# Patient Record
Sex: Female | Born: 1960 | Race: Black or African American | Hispanic: No | Marital: Married | Smoking: Former smoker
Health system: Southern US, Community
[De-identification: ages and names within clinical notes are randomized; demographics above are authoritative.]

## PROBLEM LIST (undated history)

## (undated) DIAGNOSIS — Z853 Personal history of malignant neoplasm of breast: Secondary | ICD-10-CM

## (undated) DIAGNOSIS — I10 Essential (primary) hypertension: Secondary | ICD-10-CM

## (undated) DIAGNOSIS — C3410 Malignant neoplasm of upper lobe, unspecified bronchus or lung: Secondary | ICD-10-CM

## (undated) DIAGNOSIS — E119 Type 2 diabetes mellitus without complications: Secondary | ICD-10-CM

## (undated) DIAGNOSIS — T4145XA Adverse effect of unspecified anesthetic, initial encounter: Secondary | ICD-10-CM

## (undated) DIAGNOSIS — Z87891 Personal history of nicotine dependence: Secondary | ICD-10-CM

## (undated) DIAGNOSIS — B9681 Helicobacter pylori [H. pylori] as the cause of diseases classified elsewhere: Secondary | ICD-10-CM

## (undated) DIAGNOSIS — N19 Unspecified kidney failure: Secondary | ICD-10-CM

## (undated) DIAGNOSIS — IMO0002 Reserved for concepts with insufficient information to code with codable children: Secondary | ICD-10-CM

## (undated) DIAGNOSIS — Z1635 Resistance to multiple antimicrobial drugs: Secondary | ICD-10-CM

## (undated) DIAGNOSIS — H269 Unspecified cataract: Secondary | ICD-10-CM

## (undated) DIAGNOSIS — Z1239 Encounter for other screening for malignant neoplasm of breast: Secondary | ICD-10-CM

## (undated) DIAGNOSIS — T8859XA Other complications of anesthesia, initial encounter: Secondary | ICD-10-CM

## (undated) DIAGNOSIS — C801 Malignant (primary) neoplasm, unspecified: Secondary | ICD-10-CM

## (undated) DIAGNOSIS — C50919 Malignant neoplasm of unspecified site of unspecified female breast: Secondary | ICD-10-CM

## (undated) DIAGNOSIS — Z1211 Encounter for screening for malignant neoplasm of colon: Secondary | ICD-10-CM

## (undated) DIAGNOSIS — K2980 Duodenitis without bleeding: Secondary | ICD-10-CM

## (undated) HISTORY — DX: Duodenitis without bleeding: K29.80

## (undated) HISTORY — DX: Personal history of nicotine dependence: Z87.891

## (undated) HISTORY — DX: Type 2 diabetes mellitus without complications: E11.9

## (undated) HISTORY — DX: Unspecified kidney failure: N19

## (undated) HISTORY — DX: Malignant neoplasm of unspecified site of unspecified female breast: C50.919

## (undated) HISTORY — DX: Essential (primary) hypertension: I10

## (undated) HISTORY — PX: CATARACT EXTRACTION: SUR2

## (undated) HISTORY — DX: Encounter for other screening for malignant neoplasm of breast: Z12.39

## (undated) HISTORY — DX: Reserved for concepts with insufficient information to code with codable children: IMO0002

## (undated) HISTORY — PX: MASTECTOMY: SHX3

## (undated) HISTORY — DX: Helicobacter pylori (H. pylori) as the cause of diseases classified elsewhere: B96.81

## (undated) HISTORY — DX: Malignant (primary) neoplasm, unspecified: C80.1

## (undated) HISTORY — DX: Encounter for screening for malignant neoplasm of colon: Z12.11

## (undated) HISTORY — DX: Unspecified cataract: H26.9

## (undated) HISTORY — PX: COLONOSCOPY: SHX174

## (undated) HISTORY — DX: Resistance to multiple antimicrobial drugs: Z16.35

## (undated) HISTORY — DX: Malignant neoplasm of upper lobe, unspecified bronchus or lung: C34.10

## (undated) HISTORY — PX: LOBECTOMY: SHX5089

## (undated) HISTORY — DX: Personal history of malignant neoplasm of breast: Z85.3

---

## 1999-09-01 DIAGNOSIS — C801 Malignant (primary) neoplasm, unspecified: Secondary | ICD-10-CM

## 1999-09-01 HISTORY — PX: BREAST LUMPECTOMY: SHX2

## 1999-09-01 HISTORY — DX: Malignant (primary) neoplasm, unspecified: C80.1

## 2003-09-01 HISTORY — PX: CATARACT EXTRACTION W/ INTRAOCULAR LENS IMPLANT: SHX1309

## 2003-09-01 HISTORY — PX: INTERCOSTAL NERVE BLOCK: SHX5021

## 2004-07-03 ENCOUNTER — Ambulatory Visit: Payer: Self-pay | Admitting: Oncology

## 2004-10-13 ENCOUNTER — Ambulatory Visit: Payer: Self-pay | Admitting: Oncology

## 2004-12-04 ENCOUNTER — Ambulatory Visit: Payer: Self-pay | Admitting: Oncology

## 2004-12-29 ENCOUNTER — Ambulatory Visit: Payer: Self-pay | Admitting: Oncology

## 2005-01-28 ENCOUNTER — Ambulatory Visit: Payer: Self-pay | Admitting: Unknown Physician Specialty

## 2005-06-01 ENCOUNTER — Ambulatory Visit: Payer: Self-pay | Admitting: Family Medicine

## 2005-06-17 ENCOUNTER — Ambulatory Visit: Payer: Self-pay | Admitting: Oncology

## 2005-10-02 ENCOUNTER — Ambulatory Visit: Payer: Self-pay | Admitting: General Surgery

## 2006-10-06 ENCOUNTER — Ambulatory Visit: Payer: Self-pay | Admitting: General Surgery

## 2007-11-07 ENCOUNTER — Ambulatory Visit: Payer: Self-pay | Admitting: General Surgery

## 2008-11-08 ENCOUNTER — Ambulatory Visit: Payer: Self-pay | Admitting: Family Medicine

## 2008-11-12 ENCOUNTER — Ambulatory Visit: Payer: Self-pay | Admitting: General Surgery

## 2008-12-12 ENCOUNTER — Ambulatory Visit: Payer: Self-pay | Admitting: Family Medicine

## 2009-02-28 ENCOUNTER — Ambulatory Visit: Payer: Self-pay | Admitting: Oncology

## 2009-03-28 ENCOUNTER — Ambulatory Visit: Payer: Self-pay | Admitting: Oncology

## 2009-08-31 HISTORY — PX: CARPAL TUNNEL RELEASE: SHX101

## 2009-11-20 ENCOUNTER — Ambulatory Visit: Payer: Self-pay | Admitting: General Surgery

## 2010-02-11 ENCOUNTER — Ambulatory Visit: Payer: Self-pay | Admitting: Orthopedic Surgery

## 2010-02-17 ENCOUNTER — Ambulatory Visit: Payer: Self-pay | Admitting: Orthopedic Surgery

## 2010-12-03 ENCOUNTER — Ambulatory Visit: Payer: Self-pay | Admitting: General Surgery

## 2011-01-30 ENCOUNTER — Ambulatory Visit: Payer: Self-pay | Admitting: General Surgery

## 2011-06-29 ENCOUNTER — Other Ambulatory Visit: Payer: Self-pay | Admitting: Family Medicine

## 2011-10-30 ENCOUNTER — Other Ambulatory Visit: Payer: Self-pay | Admitting: Family Medicine

## 2011-10-30 LAB — COMPREHENSIVE METABOLIC PANEL
Albumin: 4.1 g/dL (ref 3.4–5.0)
Anion Gap: 10 (ref 7–16)
Bilirubin,Total: 0.4 mg/dL (ref 0.2–1.0)
Calcium, Total: 9.5 mg/dL (ref 8.5–10.1)
EGFR (African American): >60
Glucose: 110 mg/dL — ABNORMAL HIGH (ref 65–99)
Potassium: 3.5 mmol/L (ref 3.5–5.1)
SGOT(AST): 26 U/L (ref 15–37)

## 2011-10-30 LAB — CBC WITH DIFFERENTIAL/PLATELET
Basophil #: 0.3 10*3/uL — ABNORMAL HIGH (ref 0.0–0.1)
Basophil %: 1.3 %
Eosinophil #: 0.4 10*3/uL (ref 0.0–0.7)
Eosinophil %: 1.9 %
HGB: 14.6 g/dL (ref 12.0–16.0)
Lymphocyte #: 3.8 10*3/uL — ABNORMAL HIGH (ref 1.0–3.6)
MCH: 32.5 pg (ref 26.0–34.0)
MCV: 95 fL (ref 80–100)
Monocyte %: 3.9 %
Neutrophil #: 17 10*3/uL — ABNORMAL HIGH (ref 1.4–6.5)
Platelet: 273 10*3/uL (ref 150–440)
RDW: 12.8 % (ref 11.5–14.5)
WBC: 22.4 10*3/uL — ABNORMAL HIGH (ref 3.6–11.0)

## 2011-10-30 LAB — URINALYSIS, COMPLETE
Bilirubin,UR: NEGATIVE
Ketone: NEGATIVE
Ph: 5 (ref 4.5–8.0)
Specific Gravity: 1.023 (ref 1.003–1.030)
Squamous Epithelial: 2

## 2011-11-04 ENCOUNTER — Ambulatory Visit: Payer: Self-pay | Admitting: Oncology

## 2011-11-06 LAB — CBC CANCER CENTER
HGB: 13.8 g/dL (ref 12.0–16.0)
Lymphocyte %: 33.6 %
MCHC: 33.8 g/dL (ref 32.0–36.0)
Monocyte #: 0.8 x10 3/mm — ABNORMAL HIGH (ref 0.0–0.7)
Monocyte %: 6.3 %
Neutrophil %: 54.6 %
Platelet: 279 x10 3/mm (ref 150–440)
RDW: 12.5 % (ref 11.5–14.5)
WBC: 13.4 x10 3/mm — ABNORMAL HIGH (ref 3.6–11.0)

## 2011-11-30 ENCOUNTER — Ambulatory Visit: Payer: Self-pay | Admitting: Oncology

## 2011-12-09 ENCOUNTER — Ambulatory Visit: Payer: Self-pay | Admitting: General Surgery

## 2011-12-25 LAB — URINALYSIS, COMPLETE
Bacteria: NEGATIVE
Glucose,UR: NEGATIVE mg/dL (ref 0–75)
Ketone: NEGATIVE
Ph: 8.5 (ref 4.5–8.0)
Protein: NEGATIVE

## 2011-12-25 LAB — CBC CANCER CENTER
Eosinophil #: 0.5 x10 3/mm (ref 0.0–0.7)
Eosinophil %: 2.9 %
HCT: 41.4 % (ref 35.0–47.0)
MCH: 33.1 pg (ref 26.0–34.0)
MCHC: 35 g/dL (ref 32.0–36.0)
RBC: 4.38 10*6/uL (ref 3.80–5.20)
RDW: 13.1 % (ref 11.5–14.5)

## 2011-12-30 ENCOUNTER — Ambulatory Visit: Payer: Self-pay | Admitting: Oncology

## 2012-08-31 DIAGNOSIS — C3410 Malignant neoplasm of upper lobe, unspecified bronchus or lung: Secondary | ICD-10-CM

## 2012-08-31 HISTORY — PX: BREAST SURGERY: SHX581

## 2012-08-31 HISTORY — DX: Malignant neoplasm of upper lobe, unspecified bronchus or lung: C34.10

## 2012-09-05 ENCOUNTER — Other Ambulatory Visit: Payer: Self-pay | Admitting: Family Medicine

## 2012-09-05 LAB — URINALYSIS, COMPLETE
Bacteria: NONE SEEN
Glucose,UR: NEGATIVE mg/dL (ref 0–75)
Leukocyte Esterase: NEGATIVE
Nitrite: NEGATIVE
RBC,UR: 11 /HPF (ref 0–5)
Specific Gravity: 1.031 (ref 1.003–1.030)
Squamous Epithelial: 34
WBC UR: 7 /HPF (ref 0–5)

## 2012-09-05 LAB — COMPREHENSIVE METABOLIC PANEL
Albumin: 3.8 g/dL (ref 3.4–5.0)
Alkaline Phosphatase: 116 U/L (ref 50–136)
BUN: 13 mg/dL (ref 7–18)
Bilirubin,Total: 0.3 mg/dL (ref 0.2–1.0)
Co2: 27 mmol/L (ref 21–32)
EGFR (Non-African Amer.): >60
Glucose: 131 mg/dL — ABNORMAL HIGH (ref 65–99)
Potassium: 3.5 mmol/L (ref 3.5–5.1)
SGOT(AST): 21 U/L (ref 15–37)
SGPT (ALT): 24 U/L (ref 12–78)
Sodium: 141 mmol/L (ref 136–145)

## 2012-09-05 LAB — CBC WITH DIFFERENTIAL/PLATELET
Basophil #: 0.3 10*3/uL — ABNORMAL HIGH (ref 0.0–0.1)
Eosinophil #: 0.6 10*3/uL (ref 0.0–0.7)
Eosinophil %: 3.7 %
Lymphocyte %: 30.1 %
MCH: 31.5 pg (ref 26.0–34.0)
Monocyte #: 1.2 x10 3/mm — ABNORMAL HIGH (ref 0.2–0.9)
Monocyte %: 8 %
Neutrophil #: 8.6 10*3/uL — ABNORMAL HIGH (ref 1.4–6.5)
Neutrophil %: 56.1 %
Platelet: 278 10*3/uL (ref 150–440)
WBC: 15.3 10*3/uL — ABNORMAL HIGH (ref 3.6–11.0)

## 2012-09-05 LAB — LIPID PANEL
Cholesterol: 219 mg/dL — ABNORMAL HIGH (ref 0–200)
HDL Cholesterol: 36 mg/dL — ABNORMAL LOW (ref 40–60)
Triglycerides: 104 mg/dL (ref 0–200)

## 2012-10-21 ENCOUNTER — Encounter: Payer: Self-pay | Admitting: General Surgery

## 2012-11-22 ENCOUNTER — Ambulatory Visit (INDEPENDENT_AMBULATORY_CARE_PROVIDER_SITE_OTHER): Payer: 59 | Admitting: General Surgery

## 2012-11-22 ENCOUNTER — Encounter: Payer: Self-pay | Admitting: General Surgery

## 2012-11-22 VITALS — BP 124/64 | HR 76 | Resp 14 | Ht 62.5 in | Wt 148.0 lb

## 2012-11-22 DIAGNOSIS — N63 Unspecified lump in unspecified breast: Secondary | ICD-10-CM

## 2012-11-22 DIAGNOSIS — Z853 Personal history of malignant neoplasm of breast: Secondary | ICD-10-CM

## 2012-11-22 NOTE — Patient Instructions (Signed)
Patient has been scheduled for a bilateral diagnostic mammogram at [facility] for 11-23-12 at 8:30 am. She is aware of date, time, and instructions.

## 2012-11-22 NOTE — Progress Notes (Signed)
Patient ID: Brittney Lam, female   DOB: 1961-04-27, 52 y.o.   MRN: [id number]  Chief Complaint  Patient presents with  . breast complaint    HPI [name] is a 52 y.o. female. HPI  Patient here today for evaluation of both areas around the nipple, has sharp shooting pains daily with "deep itch" for about 6 months and getting worse. Denies nipple drainage. Patient with known history of left breast cancer s/p L/SN/R in 2001. She has noticed moderate increase in right breast size Past Medical History  Diagnosis Date  . Unspecified essential hypertension   . Kidney failure   . Personal history of tobacco use, presenting hazards to health   . Cataract   . Personal history of malignant neoplasm of breast   . Breast screening, unspecified   . Special screening for malignant neoplasms, colon   . Cancer     left breast cancer s/p L/SN/R in 2001    Past Surgical History  Procedure Laterality Date  . Breast lumpectomy Left 2001  . Intercostal nerve block  2005  . Cataract extraction w/ intraocular lens implant  2005  . Carpal tunnel release Right 2011    No family history on file.  Social History History  Substance Use Topics  . Smoking status: Current Every Day Smoker -- 0.50 packs/day for 30 years  . Smokeless tobacco: Never Used  . Alcohol Use: No    No Known Allergies  Current Outpatient Prescriptions  Medication Sig Dispense Refill  . hydrochlorothiazide (HYDRODIURIL) 25 MG tablet Take 25 mg by mouth daily.      . Multiple Vitamins-Minerals (MULTIVITAMIN PO) Take by mouth.       No current facility-administered medications for this visit.    Review of Systems Review of Systems  Constitutional: Negative for fever, fatigue and unexpected weight change.  Respiratory: Negative.   Cardiovascular: Negative.   Gastrointestinal: Negative.     Blood pressure 124/64, pulse 76, resp. rate 14, height 5' 2.5" (1.588 m), weight 148 lb (67.132 kg).  Physical Exam Physical  Exam  Constitutional: She appears well-developed and well-nourished.  Eyes: Conjunctivae and EOM are normal.  Neck: No tracheal deviation present. No thyromegaly present.  Pulmonary/Chest: Right breast exhibits inverted nipple, mass, skin change and tenderness. Right breast exhibits no nipple discharge. Left breast exhibits skin change. Left breast exhibits no inverted nipple and no tenderness. Breasts are asymmetrical.    Lymphadenopathy:    She has no cervical adenopathy.    She has no axillary adenopathy.    Data Reviewed none  Assessment    New right breast abnormality-large hard central mass with skin changes.     Plan  Ordered mammogram. To return after for [place] and core biopsy        Currie Paris 11/22/2012, 10:29 AM

## 2012-11-23 ENCOUNTER — Ambulatory Visit: Payer: Self-pay | Admitting: General Surgery

## 2012-11-24 ENCOUNTER — Other Ambulatory Visit: Payer: Self-pay

## 2012-11-24 ENCOUNTER — Encounter: Payer: Self-pay | Admitting: General Surgery

## 2012-11-24 ENCOUNTER — Ambulatory Visit (INDEPENDENT_AMBULATORY_CARE_PROVIDER_SITE_OTHER): Payer: 59 | Admitting: General Surgery

## 2012-11-24 ENCOUNTER — Other Ambulatory Visit: Payer: Self-pay | Admitting: General Surgery

## 2012-11-24 VITALS — BP 110/72 | HR 80 | Resp 12 | Ht 62.5 in | Wt 148.0 lb

## 2012-11-24 DIAGNOSIS — N63 Unspecified lump in unspecified breast: Secondary | ICD-10-CM

## 2012-11-24 NOTE — Patient Instructions (Addendum)

## 2012-11-24 NOTE — Progress Notes (Signed)
Patient ID: Brittney Lam, female   DOB: 19-Dec-1960, 52 y.o.   MRN: [id number]  Chief Complaint  Patient presents with  . Follow-up     mammogram    HPI Brittney Lam is a 52 y.o. female here today for her follow up mammogram done on 11/23/12 . HPI  Past Medical History  Diagnosis Date  . Unspecified essential hypertension   . Kidney failure   . Personal history of tobacco use, presenting hazards to health   . Cataract   . Personal history of malignant neoplasm of breast   . Breast screening, unspecified   . Special screening for malignant neoplasms, colon   . Cancer     left breast cancer s/p L/SN/R in 2001    Past Surgical History  Procedure Laterality Date  . Breast lumpectomy Left 2001  . Intercostal nerve block  2005  . Cataract extraction w/ intraocular lens implant  2005  . Carpal tunnel release Right 2011    History reviewed. No pertinent family history.  Social History History  Substance Use Topics  . Smoking status: Current Every Day Smoker -- 0.50 packs/day for 30 years  . Smokeless tobacco: Never Used  . Alcohol Use: No    No Known Allergies  Current Outpatient Prescriptions  Medication Sig Dispense Refill  . hydrochlorothiazide (HYDRODIURIL) 25 MG tablet Take 25 mg by mouth daily.      . Multiple Vitamins-Minerals (MULTIVITAMIN PO) Take by mouth.       No current facility-administered medications for this visit.    Review of Systems Review of Systems  Constitutional: Negative.   Respiratory: Negative.   Cardiovascular: Negative.     There were no vitals taken for this visit.  Physical Exam Physical Exam  Data Reviewed Mammogram reviewed. Shows only skin thickening over right breast. No defined mass or density. US performed here shows heterogeneous pattern with areas of shadowing in uoq central location.  Assessment    Suspicious findings in right breast     Plan    Core biopsy and skin biopsy completed today        Ples Specter 11/24/2012, 8:42 AM

## 2012-11-28 ENCOUNTER — Ambulatory Visit (INDEPENDENT_AMBULATORY_CARE_PROVIDER_SITE_OTHER): Payer: 59 | Admitting: General Surgery

## 2012-11-28 ENCOUNTER — Encounter: Payer: Self-pay | Admitting: General Surgery

## 2012-11-28 VITALS — BP 122/82 | HR 80 | Resp 14 | Ht 62.0 in | Wt 146.0 lb

## 2012-11-28 DIAGNOSIS — C50911 Malignant neoplasm of unspecified site of right female breast: Secondary | ICD-10-CM

## 2012-11-28 DIAGNOSIS — C50919 Malignant neoplasm of unspecified site of unspecified female breast: Secondary | ICD-10-CM

## 2012-11-28 NOTE — Patient Instructions (Signed)
Patient follow up is to be determined.

## 2012-11-29 ENCOUNTER — Ambulatory Visit: Payer: Self-pay | Admitting: Oncology

## 2012-11-29 ENCOUNTER — Telehealth: Payer: Self-pay | Admitting: *Deleted

## 2012-11-29 ENCOUNTER — Other Ambulatory Visit: Payer: Self-pay | Admitting: General Surgery

## 2012-11-29 ENCOUNTER — Encounter: Payer: Self-pay | Admitting: General Surgery

## 2012-11-29 DIAGNOSIS — C50911 Malignant neoplasm of unspecified site of right female breast: Secondary | ICD-10-CM

## 2012-11-29 LAB — COMPREHENSIVE METABOLIC PANEL
Alkaline Phosphatase: 117 U/L (ref 50–136)
Bilirubin,Total: 0.4 mg/dL (ref 0.2–1.0)
Co2: 34 mmol/L — ABNORMAL HIGH (ref 21–32)
EGFR (Non-African Amer.): 57 — ABNORMAL LOW
Osmolality: 283 (ref 275–301)
Potassium: 3.5 mmol/L (ref 3.5–5.1)
SGOT(AST): 16 U/L (ref 15–37)
SGPT (ALT): 21 U/L (ref 12–78)
Total Protein: 8.5 g/dL — ABNORMAL HIGH (ref 6.4–8.2)

## 2012-11-29 LAB — CBC CANCER CENTER
Basophil #: 0.2 x10 3/mm — ABNORMAL HIGH (ref 0.0–0.1)
Basophil %: 1.2 %
Eosinophil #: 0.6 x10 3/mm (ref 0.0–0.7)
HCT: 42.6 % (ref 35.0–47.0)
HGB: 14.6 g/dL (ref 12.0–16.0)
Lymphocyte %: 32.1 %
MCH: 32 pg (ref 26.0–34.0)
MCHC: 34.3 g/dL (ref 32.0–36.0)
MCV: 93 fL (ref 80–100)
Neutrophil #: 8.9 x10 3/mm — ABNORMAL HIGH (ref 1.4–6.5)
RDW: 13.2 % (ref 11.5–14.5)
WBC: 15.6 x10 3/mm — ABNORMAL HIGH (ref 3.6–11.0)

## 2012-11-29 LAB — PROTIME-INR
INR: 0.9
Prothrombin Time: 12.5 secs (ref 11.5–14.7)

## 2012-11-29 LAB — APTT: Activated PTT: 32.6 secs (ref 23.6–35.9)

## 2012-11-29 NOTE — Telephone Encounter (Signed)
Tia from the [facility] called to arrange a port placement. PET scan has been scheduled for 12-05-12 and chemotherapy to possibly start on 12-09-12. Port placement has been arranged at [facility] for 12-07-12. I also spoke with the patient while she was at the cancer center. She is aware of all instructions. Paperwork has been mailed to her.

## 2012-11-29 NOTE — Progress Notes (Signed)
Patient ID: Brittney Lam, female   DOB: 01/01/1961, 52 y.o.   MRN: [id number] Pt had core biopsy of right breast with skin biopsy.  Path- invasive mucinous carcinoma. Skin biopsy showed no cancer. Pt was here with her sister and explained findings in full.  Given the large size of this cancer she would benefit with neoadjuvant chemotherapy followed by mastectomy.  Pt is to see Dr. Doylene Canning tomorrow for treatment planning. PET scan is needed to assess extent of the cancer.  Venous port also discussed. Pt is agreeable to plan.

## 2012-12-01 ENCOUNTER — Encounter: Payer: Self-pay | Admitting: General Surgery

## 2012-12-01 NOTE — Progress Notes (Signed)
Results forwarded to Dr. Doylene Canning.

## 2012-12-05 ENCOUNTER — Ambulatory Visit: Payer: Self-pay | Admitting: Oncology

## 2012-12-05 ENCOUNTER — Encounter: Payer: Self-pay | Admitting: General Surgery

## 2012-12-05 ENCOUNTER — Ambulatory Visit: Payer: Self-pay | Admitting: Anesthesiology

## 2012-12-05 DIAGNOSIS — I10 Essential (primary) hypertension: Secondary | ICD-10-CM

## 2012-12-07 ENCOUNTER — Encounter: Payer: Self-pay | Admitting: General Surgery

## 2012-12-07 ENCOUNTER — Ambulatory Visit: Payer: Self-pay | Admitting: General Surgery

## 2012-12-07 DIAGNOSIS — C50119 Malignant neoplasm of central portion of unspecified female breast: Secondary | ICD-10-CM

## 2012-12-07 HISTORY — PX: INSERTION CENTRAL VENOUS ACCESS DEVICE W/ SUBCUTANEOUS PORT: SUR725

## 2012-12-08 ENCOUNTER — Encounter: Payer: Self-pay | Admitting: General Surgery

## 2012-12-09 LAB — PATHOLOGY

## 2012-12-15 LAB — CBC CANCER CENTER
Basophil #: 0 x10 3/mm (ref 0.0–0.1)
Eosinophil #: 0.3 x10 3/mm (ref 0.0–0.7)
HCT: 38.3 % (ref 35.0–47.0)
Lymphocyte #: 2.6 x10 3/mm (ref 1.0–3.6)
Lymphocyte %: 46 %
MCH: 31.8 pg (ref 26.0–34.0)
MCHC: 34.4 g/dL (ref 32.0–36.0)
Monocyte #: 0.4 x10 3/mm (ref 0.2–0.9)
Neutrophil #: 2.4 x10 3/mm (ref 1.4–6.5)
Neutrophil %: 41.7 %
Platelet: 146 x10 3/mm — ABNORMAL LOW (ref 150–440)
RBC: 4.15 10*6/uL (ref 3.80–5.20)
RDW: 13 % (ref 11.5–14.5)

## 2012-12-20 ENCOUNTER — Encounter: Payer: Self-pay | Admitting: General Surgery

## 2012-12-20 ENCOUNTER — Ambulatory Visit (INDEPENDENT_AMBULATORY_CARE_PROVIDER_SITE_OTHER): Payer: 59 | Admitting: General Surgery

## 2012-12-20 VITALS — BP 122/68 | HR 72 | Resp 12 | Ht 62.0 in | Wt 148.0 lb

## 2012-12-20 DIAGNOSIS — C50911 Malignant neoplasm of unspecified site of right female breast: Secondary | ICD-10-CM

## 2012-12-20 DIAGNOSIS — C50919 Malignant neoplasm of unspecified site of unspecified female breast: Secondary | ICD-10-CM

## 2012-12-20 DIAGNOSIS — R918 Other nonspecific abnormal finding of lung field: Secondary | ICD-10-CM

## 2012-12-20 DIAGNOSIS — R222 Localized swelling, mass and lump, trunk: Secondary | ICD-10-CM

## 2012-12-20 DIAGNOSIS — Z853 Personal history of malignant neoplasm of breast: Secondary | ICD-10-CM

## 2012-12-20 DIAGNOSIS — N63 Unspecified lump in unspecified breast: Secondary | ICD-10-CM

## 2012-12-20 NOTE — Progress Notes (Signed)
Patient ID: Brittney Lam, female   DOB: 1960/09/05, 52 y.o.   MRN: [id number]  Chief Complaint  Patient presents with  . Routine Post Op    port placement    HPI Brittney Lam is a 52 y.o. female.  Patient here today for followup port a cath placement 12-07-12. She had first cycle of chemotherapy.  Pre treatment CT scan has showed a right lung upper lobe mass.  Tentative plan for biopsy of this.   HPI  Past Medical History  Diagnosis Date  . Unspecified essential hypertension   . Kidney failure   . Personal history of tobacco use, presenting hazards to health   . Cataract   . Personal history of malignant neoplasm of breast   . Breast screening, unspecified   . Special screening for malignant neoplasms, colon   . Cancer 2001    left breast cancer s/p L/SN/R in 2001  . Cancer 2014    right breast    Past Surgical History  Procedure Laterality Date  . Breast lumpectomy Left 2001  . Intercostal nerve block  2005  . Cataract extraction w/ intraocular lens implant  2005  . Carpal tunnel release Right 2011  . Insertion central venous access device w/ subcutaneous port  12-07-12    History reviewed. No pertinent family history.  Social History History  Substance Use Topics  . Smoking status: Current Every Day Smoker -- 0.50 packs/day for 30 years  . Smokeless tobacco: Never Used  . Alcohol Use: No    No Known Allergies  Current Outpatient Prescriptions  Medication Sig Dispense Refill  . acetaminophen (TYLENOL) 325 MG tablet Take 650 mg by mouth every 6 (six) hours as needed for pain.      Marland Kitchen docusate sodium (COLACE) 100 MG capsule Take 100 mg by mouth 2 (two) times daily.      . promethazine (PHENERGAN) 25 MG tablet Take 25 mg by mouth every 6 (six) hours as needed for nausea.      . traMADol (ULTRAM) 50 MG tablet Take 50 mg by mouth every 6 (six) hours as needed for pain.      . hydrochlorothiazide (HYDRODIURIL) 25 MG tablet Take 25 mg by mouth daily.      . Multiple  Vitamins-Minerals (MULTIVITAMIN PO) Take by mouth.       No current facility-administered medications for this visit.    Review of Systems Review of Systems  Constitutional: Negative.   Respiratory: Negative.   Cardiovascular: Negative.     Blood pressure 122/68, pulse 72, resp. rate 12, height 5\' 2"  (1.575 m), weight 148 lb (67.132 kg).  Physical Exam Physical Exam  Constitutional: She is oriented to person, place, and time. She appears well-developed and well-nourished.  Cardiovascular: Normal rate and regular rhythm.   Pulmonary/Chest: Effort normal and breath sounds normal.  Neurological: She is alert and oriented to person, place, and time.  Skin: Skin is warm and dry.  Port site left upper chest wall healing well.  Data Reviewed CT scan reviewed  Assessment    CA right breast, right lung upper lobe mass     Plan    RTC in 2 mos        Keagan Brislin G 12/20/2012, 11:11 AM

## 2012-12-20 NOTE — Patient Instructions (Addendum)
Continue chemotherapy at [facility] as scheduled. May need biopsy of lung mass Dr Doylene Canning will schedule this in near future. Call for any new issues, questions or concerns.

## 2012-12-22 ENCOUNTER — Ambulatory Visit: Payer: Self-pay | Admitting: General Surgery

## 2012-12-22 LAB — CBC CANCER CENTER
Basophil #: 0.2 x10 3/mm — ABNORMAL HIGH (ref 0.0–0.1)
Basophil %: 0.8 %
Eosinophil #: 0.1 x10 3/mm (ref 0.0–0.7)
Eosinophil %: 0.5 %
HCT: 35.7 % (ref 35.0–47.0)
Lymphocyte #: 3.5 x10 3/mm (ref 1.0–3.6)
MCV: 93 fL (ref 80–100)
Neutrophil #: 15 x10 3/mm — ABNORMAL HIGH (ref 1.4–6.5)
RBC: 3.85 10*6/uL (ref 3.80–5.20)
RDW: 12.9 % (ref 11.5–14.5)

## 2012-12-29 ENCOUNTER — Ambulatory Visit: Payer: Self-pay | Admitting: Oncology

## 2013-01-02 LAB — CBC CANCER CENTER
Basophil %: 3.5 %
Lymphocyte %: 30.8 %
MCH: 31.8 pg (ref 26.0–34.0)
Monocyte %: 12.5 %
Neutrophil #: 5.5 x10 3/mm (ref 1.4–6.5)
Neutrophil %: 50.3 %
RBC: 3.72 10*6/uL — ABNORMAL LOW (ref 3.80–5.20)
WBC: 11 x10 3/mm (ref 3.6–11.0)

## 2013-01-02 LAB — COMPREHENSIVE METABOLIC PANEL
Albumin: 3.4 g/dL (ref 3.4–5.0)
Alkaline Phosphatase: 117 U/L (ref 50–136)
Anion Gap: 12 (ref 7–16)
BUN: 14 mg/dL (ref 7–18)
Bilirubin,Total: <0.1 mg/dL — ABNORMAL LOW (ref 0.2–1.0)
Calcium, Total: 8.9 mg/dL (ref 8.5–10.1)
Co2: 25 mmol/L (ref 21–32)
EGFR (Non-African Amer.): 57 — ABNORMAL LOW
Glucose: 239 mg/dL — ABNORMAL HIGH (ref 65–99)
Osmolality: 290 (ref 275–301)
Potassium: 3.1 mmol/L — ABNORMAL LOW (ref 3.5–5.1)
SGOT(AST): 17 U/L (ref 15–37)

## 2013-01-11 ENCOUNTER — Telehealth: Payer: Self-pay | Admitting: *Deleted

## 2013-01-11 NOTE — Telephone Encounter (Signed)
Return to work note for Dec 30 2012

## 2013-01-24 LAB — COMPREHENSIVE METABOLIC PANEL
Alkaline Phosphatase: 121 U/L (ref 50–136)
Anion Gap: 5 — ABNORMAL LOW (ref 7–16)
Calcium, Total: 9.7 mg/dL (ref 8.5–10.1)
Chloride: 103 mmol/L (ref 98–107)
Co2: 30 mmol/L (ref 21–32)
EGFR (African American): >60
EGFR (Non-African Amer.): >60
Glucose: 119 mg/dL — ABNORMAL HIGH (ref 65–99)
Osmolality: 278 (ref 275–301)
Potassium: 3.2 mmol/L — ABNORMAL LOW (ref 3.5–5.1)
SGOT(AST): 19 U/L (ref 15–37)
SGPT (ALT): 23 U/L (ref 12–78)

## 2013-01-24 LAB — CBC CANCER CENTER
Basophil #: 0.3 x10 3/mm — ABNORMAL HIGH (ref 0.0–0.1)
Eosinophil #: 0.1 x10 3/mm (ref 0.0–0.7)
Eosinophil %: 0.9 %
Lymphocyte #: 3.2 x10 3/mm (ref 1.0–3.6)
MCH: 33 pg (ref 26.0–34.0)
MCV: 95 fL (ref 80–100)
Monocyte %: 14.2 %
Neutrophil #: 6.9 x10 3/mm — ABNORMAL HIGH (ref 1.4–6.5)
Neutrophil %: 56.1 %
RBC: 3.77 10*6/uL — ABNORMAL LOW (ref 3.80–5.20)

## 2013-01-29 ENCOUNTER — Ambulatory Visit: Payer: Self-pay | Admitting: Oncology

## 2013-02-14 LAB — COMPREHENSIVE METABOLIC PANEL
Alkaline Phosphatase: 114 U/L (ref 50–136)
Anion Gap: 5 — ABNORMAL LOW (ref 7–16)
Bilirubin,Total: 0.3 mg/dL (ref 0.2–1.0)
Calcium, Total: 9.4 mg/dL (ref 8.5–10.1)
Chloride: 100 mmol/L (ref 98–107)
EGFR (African American): >60
EGFR (Non-African Amer.): 55 — ABNORMAL LOW
Glucose: 161 mg/dL — ABNORMAL HIGH (ref 65–99)
Osmolality: 274 (ref 275–301)
Potassium: 2.9 mmol/L — ABNORMAL LOW (ref 3.5–5.1)
SGOT(AST): 16 U/L (ref 15–37)

## 2013-02-14 LAB — CBC CANCER CENTER
Basophil #: 0 x10 3/mm (ref 0.0–0.1)
Basophil %: 0.2 %
Eosinophil #: 0 x10 3/mm (ref 0.0–0.7)
Eosinophil %: 0.1 %
HCT: 35.9 % (ref 35.0–47.0)
HGB: 12.5 g/dL (ref 12.0–16.0)
Lymphocyte #: 3.2 x10 3/mm (ref 1.0–3.6)
MCH: 33.9 pg (ref 26.0–34.0)
MCHC: 34.9 g/dL (ref 32.0–36.0)
Neutrophil #: 11.3 x10 3/mm — ABNORMAL HIGH (ref 1.4–6.5)
Neutrophil %: 68.6 %

## 2013-02-20 ENCOUNTER — Encounter: Payer: Self-pay | Admitting: General Surgery

## 2013-02-20 ENCOUNTER — Ambulatory Visit (INDEPENDENT_AMBULATORY_CARE_PROVIDER_SITE_OTHER): Payer: 59 | Admitting: General Surgery

## 2013-02-20 VITALS — BP 120/80 | HR 72 | Resp 16 | Ht 62.0 in | Wt 149.0 lb

## 2013-02-20 DIAGNOSIS — R918 Other nonspecific abnormal finding of lung field: Secondary | ICD-10-CM

## 2013-02-20 DIAGNOSIS — C50919 Malignant neoplasm of unspecified site of unspecified female breast: Secondary | ICD-10-CM

## 2013-02-20 DIAGNOSIS — Z853 Personal history of malignant neoplasm of breast: Secondary | ICD-10-CM

## 2013-02-20 DIAGNOSIS — R222 Localized swelling, mass and lump, trunk: Secondary | ICD-10-CM

## 2013-02-20 DIAGNOSIS — C50911 Malignant neoplasm of unspecified site of right female breast: Secondary | ICD-10-CM

## 2013-02-20 NOTE — Patient Instructions (Addendum)
Call if any problems. Will arrange follow up after CT is completed.

## 2013-02-20 NOTE — Progress Notes (Addendum)
Patient ID: Brittney Lam, female   DOB: 17-Apr-1961, 52 y.o.   MRN: [id number]  Chief Complaint  Patient presents with  . Breast Cancer    HPI Brittney Lam is a 52 y.o. female here for 2 month follow up Left breast lumpectomy 2001-breast ca treated with radiation. She has a large Right breast cancer diagnosed 2 months ago- on neoadjuvant chemo. No current complaints for this visit.She also has a right lung upper lobe mass-not known if it primary or metastatic. Pt is due for her 4th cycle of chemo this week. Will be reassessed with CT scan after.  HPI  Past Medical History  Diagnosis Date  . Unspecified essential hypertension   . Kidney failure   . Personal history of tobacco use, presenting hazards to health   . Cataract   . Personal history of malignant neoplasm of breast   . Breast screening, unspecified   . Special screening for malignant neoplasms, colon   . Cancer 2001    left breast cancer s/p L/SN/R in 2001  . Cancer 2014    right breast    Past Surgical History  Procedure Laterality Date  . Breast lumpectomy Left 2001  . Intercostal nerve block  2005  . Cataract extraction w/ intraocular lens implant  2005  . Carpal tunnel release Right 2011  . Insertion central venous access device w/ subcutaneous port  12-07-12    History reviewed. No pertinent family history.  Social History History  Substance Use Topics  . Smoking status: Former Smoker -- 0.50 packs/day for 30 years  . Smokeless tobacco: Never Used  . Alcohol Use: No    No Known Allergies  Current Outpatient Prescriptions  Medication Sig Dispense Refill  . acetaminophen (TYLENOL) 325 MG tablet Take 650 mg by mouth every 6 (six) hours as needed for pain.      Marland Kitchen docusate sodium (COLACE) 100 MG capsule Take 100 mg by mouth 2 (two) times daily.      . hydrochlorothiazide (HYDRODIURIL) 25 MG tablet Take 25 mg by mouth daily.      Marland Kitchen levofloxacin (LEVAQUIN) 500 MG tablet Take 500 mg by mouth daily.      .  Multiple Vitamins-Minerals (MULTIVITAMIN PO) Take by mouth.      . promethazine (PHENERGAN) 25 MG tablet Take 25 mg by mouth every 6 (six) hours as needed for nausea.      . traMADol (ULTRAM) 50 MG tablet Take 50 mg by mouth every 6 (six) hours as needed for pain.       No current facility-administered medications for this visit.    Review of Systems Review of Systems  Constitutional: Negative.   Respiratory: Negative.   Cardiovascular: Negative.     Blood pressure 120/80, pulse 72, resp. rate 16, height 5\' 2"  (1.575 m), weight 149 lb (67.586 kg).  Physical Exam Physical Exam  Constitutional: She is oriented to person, place, and time. She appears well-developed and well-nourished.  Eyes: Conjunctivae are normal. No scleral icterus.  Pulmonary/Chest: Right breast exhibits mass. Right breast exhibits no inverted nipple, no nipple discharge, no skin change and no tenderness. Left breast exhibits no inverted nipple, no mass, no nipple discharge, no skin change and no tenderness.    Lymphadenopathy:    She has no cervical adenopathy.    She has no axillary adenopathy.  Neurological: She is alert and oriented to person, place, and time.    Data Reviewed None  Assessment    Appears to be responding to  chemo.      Plan    Discuss with [place] after CT scan       [facility] G 02/20/2013, 10:10 AM

## 2013-02-21 LAB — CBC CANCER CENTER
Eosinophil #: 0.2 x10 3/mm (ref 0.0–0.7)
Eosinophil %: 1.4 %
HGB: 12.2 g/dL (ref 12.0–16.0)
Lymphocyte %: 21.5 %
MCH: 34.3 pg — ABNORMAL HIGH (ref 26.0–34.0)
MCHC: 35.9 g/dL (ref 32.0–36.0)
Monocyte #: 1.4 x10 3/mm — ABNORMAL HIGH (ref 0.2–0.9)
Monocyte %: 11 %
Neutrophil #: 8 x10 3/mm — ABNORMAL HIGH (ref 1.4–6.5)
RBC: 3.57 10*6/uL — ABNORMAL LOW (ref 3.80–5.20)
RDW: 15.3 % — ABNORMAL HIGH (ref 11.5–14.5)
WBC: 12.3 x10 3/mm — ABNORMAL HIGH (ref 3.6–11.0)

## 2013-02-28 ENCOUNTER — Ambulatory Visit: Payer: Self-pay | Admitting: Oncology

## 2013-03-07 LAB — CBC CANCER CENTER
Basophil #: 0.1 x10 3/mm (ref 0.0–0.1)
Eosinophil #: 0.1 x10 3/mm (ref 0.0–0.7)
Eosinophil %: 0.5 %
HCT: 32.7 % — ABNORMAL LOW (ref 35.0–47.0)
HGB: 11.4 g/dL — ABNORMAL LOW (ref 12.0–16.0)
Lymphocyte #: 2.7 x10 3/mm (ref 1.0–3.6)
Lymphocyte %: 22.2 %
MCHC: 34.9 g/dL (ref 32.0–36.0)
MCV: 98 fL (ref 80–100)
Monocyte %: 11 %
Neutrophil #: 8 x10 3/mm — ABNORMAL HIGH (ref 1.4–6.5)
Platelet: 191 x10 3/mm (ref 150–440)
RBC: 3.35 10*6/uL — ABNORMAL LOW (ref 3.80–5.20)
RDW: 15.2 % — ABNORMAL HIGH (ref 11.5–14.5)

## 2013-03-07 LAB — COMPREHENSIVE METABOLIC PANEL
Albumin: 3.5 g/dL (ref 3.4–5.0)
Alkaline Phosphatase: 127 U/L (ref 50–136)
Anion Gap: 6 — ABNORMAL LOW (ref 7–16)
Bilirubin,Total: 0.3 mg/dL (ref 0.2–1.0)
Calcium, Total: 9.3 mg/dL (ref 8.5–10.1)
Chloride: 102 mmol/L (ref 98–107)
Co2: 34 mmol/L — ABNORMAL HIGH (ref 21–32)
Glucose: 126 mg/dL — ABNORMAL HIGH (ref 65–99)
Sodium: 142 mmol/L (ref 136–145)
Total Protein: 7.3 g/dL (ref 6.4–8.2)

## 2013-03-13 LAB — CBC CANCER CENTER
Eosinophil #: 0 x10 3/mm (ref 0.0–0.7)
HCT: 32.9 % — ABNORMAL LOW (ref 35.0–47.0)
HGB: 12 g/dL (ref 12.0–16.0)
Lymphocyte #: 1.8 x10 3/mm (ref 1.0–3.6)
Lymphocyte %: 23.8 %
MCH: 35.6 pg — ABNORMAL HIGH (ref 26.0–34.0)
MCHC: 36.6 g/dL — ABNORMAL HIGH (ref 32.0–36.0)
MCV: 97 fL (ref 80–100)
Monocyte %: 11.2 %
Neutrophil #: 5 x10 3/mm (ref 1.4–6.5)
Neutrophil %: 64.5 %
Platelet: 381 x10 3/mm (ref 150–440)
RBC: 3.38 10*6/uL — ABNORMAL LOW (ref 3.80–5.20)
WBC: 7.7 x10 3/mm (ref 3.6–11.0)

## 2013-03-13 LAB — PROTIME-INR: Prothrombin Time: 12.7 secs (ref 11.5–14.7)

## 2013-03-13 LAB — APTT: Activated PTT: 30.6 secs (ref 23.6–35.9)

## 2013-03-14 LAB — BASIC METABOLIC PANEL
BUN: 11 mg/dL (ref 7–18)
Calcium, Total: 8.8 mg/dL (ref 8.5–10.1)
Creatinine: 1 mg/dL (ref 0.60–1.30)
EGFR (Non-African Amer.): >60
Potassium: 3.1 mmol/L — ABNORMAL LOW (ref 3.5–5.1)
Sodium: 140 mmol/L (ref 136–145)

## 2013-03-14 LAB — CBC CANCER CENTER
Basophil #: 0.2 x10 3/mm — ABNORMAL HIGH (ref 0.0–0.1)
Basophil %: 1.9 %
Eosinophil #: 0.1 x10 3/mm (ref 0.0–0.7)
HCT: 31.8 % — ABNORMAL LOW (ref 35.0–47.0)
HGB: 11.3 g/dL — ABNORMAL LOW (ref 12.0–16.0)
Lymphocyte %: 25 %
MCV: 98 fL (ref 80–100)
Monocyte %: 11.9 %
Neutrophil #: 6.4 x10 3/mm (ref 1.4–6.5)
Platelet: 384 x10 3/mm (ref 150–440)

## 2013-03-21 LAB — BASIC METABOLIC PANEL
Anion Gap: 9 (ref 7–16)
Calcium, Total: 9.1 mg/dL (ref 8.5–10.1)
Chloride: 103 mmol/L (ref 98–107)
EGFR (African American): >60
Osmolality: 282 (ref 275–301)
Potassium: 3.6 mmol/L (ref 3.5–5.1)

## 2013-03-21 LAB — CBC CANCER CENTER
Basophil #: 0.2 x10 3/mm — ABNORMAL HIGH (ref 0.0–0.1)
Basophil %: 1.4 %
Eosinophil %: 0.8 %
HGB: 11 g/dL — ABNORMAL LOW (ref 12.0–16.0)
Lymphocyte #: 1.9 x10 3/mm (ref 1.0–3.6)
MCH: 35.2 pg — ABNORMAL HIGH (ref 26.0–34.0)
MCV: 100 fL (ref 80–100)
Monocyte #: 0.9 x10 3/mm (ref 0.2–0.9)
Monocyte %: 7.8 %
Neutrophil %: 72.6 %
Platelet: 310 x10 3/mm (ref 150–440)
RBC: 3.14 10*6/uL — ABNORMAL LOW (ref 3.80–5.20)

## 2013-03-27 ENCOUNTER — Ambulatory Visit: Payer: Self-pay | Admitting: Oncology

## 2013-03-27 LAB — APTT: Activated PTT: 29.2 secs (ref 23.6–35.9)

## 2013-03-28 LAB — COMPREHENSIVE METABOLIC PANEL
Albumin: 3.2 g/dL — ABNORMAL LOW (ref 3.4–5.0)
Chloride: 109 mmol/L — ABNORMAL HIGH (ref 98–107)
EGFR (African American): >60
EGFR (Non-African Amer.): >60
Glucose: 123 mg/dL — ABNORMAL HIGH (ref 65–99)
Osmolality: 285 (ref 275–301)
Potassium: 3.9 mmol/L (ref 3.5–5.1)
SGOT(AST): 20 U/L (ref 15–37)
SGPT (ALT): 20 U/L (ref 12–78)
Sodium: 143 mmol/L (ref 136–145)
Total Protein: 6.7 g/dL (ref 6.4–8.2)

## 2013-03-28 LAB — CBC CANCER CENTER
Basophil #: 0.1 x10 3/mm (ref 0.0–0.1)
Basophil %: 1.5 %
HCT: 29.5 % — ABNORMAL LOW (ref 35.0–47.0)
Lymphocyte #: 2 x10 3/mm (ref 1.0–3.6)
MCH: 35 pg — ABNORMAL HIGH (ref 26.0–34.0)
MCHC: 34.9 g/dL (ref 32.0–36.0)
MCV: 100 fL (ref 80–100)
Monocyte %: 7.8 %
Neutrophil %: 65.8 %
Platelet: 265 x10 3/mm (ref 150–440)
RDW: 15.8 % — ABNORMAL HIGH (ref 11.5–14.5)

## 2013-03-29 LAB — PATHOLOGY REPORT

## 2013-03-31 ENCOUNTER — Ambulatory Visit: Payer: Self-pay | Admitting: Oncology

## 2013-03-31 HISTORY — PX: OTHER SURGICAL HISTORY: SHX169

## 2013-04-03 ENCOUNTER — Encounter: Payer: Self-pay | Admitting: General Surgery

## 2013-04-03 ENCOUNTER — Ambulatory Visit (INDEPENDENT_AMBULATORY_CARE_PROVIDER_SITE_OTHER): Payer: 59 | Admitting: General Surgery

## 2013-04-03 DIAGNOSIS — C3410 Malignant neoplasm of upper lobe, unspecified bronchus or lung: Secondary | ICD-10-CM

## 2013-04-03 DIAGNOSIS — C50919 Malignant neoplasm of unspecified site of unspecified female breast: Secondary | ICD-10-CM | POA: Insufficient documentation

## 2013-04-03 NOTE — Progress Notes (Signed)
Patient ID: Brittney Lam, female   DOB: 01-26-1961, 52 y.o.   MRN: [id number]  Patient to have PFT's at [facility] on 04-04-13 at 11 am (arrive 10:30 am). Prep: no smoking, no inhalers the morning of, may have light breakfast, do not use any short acting bronchodilators within 4 hours of testing, and no long acting B-agonist bronchodilators or oral therapy with aminophylline or slow release B-agonist within 12 hours prior. She is aware of all instructions.  This patient will also be scheduled for surgery with Dr. Thelma Barge to assist.   Pt with known large right breast cancer going thru chemo. On initial w/u a RUL lung mass was noted and recent Ct shows no change in it. 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 Given this feel she would benefit with right upper lobectomy. Procedure and risks explained to her in full. She is also in need of mastectomy but has interest in simultaneous reconstruction. Feel this can be done after her chemo is completed. Pt continues to smoke. She is willing to try Nicoderm patch and Rx given. PFTs requested.

## 2013-04-03 NOTE — Patient Instructions (Addendum)
Patient to have PFT's at [facility] on 04-04-13 at 11 am (arrive 10:30 am). Prep: no smoking, no inhalers the morning of, may have light breakfast, do not use any short acting bronchodilators within 4 hours of testing, and no long acting B-agonist bronchodilators or oral therapy with aminophylline or slow release B-agonist within 12 hours prior. She is aware of all instructions.  This patient will also be scheduled for surgery with Dr. Thelma Barge to assist.

## 2013-04-04 ENCOUNTER — Telehealth: Payer: Self-pay | Admitting: *Deleted

## 2013-04-04 ENCOUNTER — Ambulatory Visit: Payer: Self-pay | Admitting: General Surgery

## 2013-04-04 LAB — CBC CANCER CENTER
Basophil %: 1.2 %
Eosinophil #: 0.2 x10 3/mm (ref 0.0–0.7)
HCT: 30.5 % — ABNORMAL LOW (ref 35.0–47.0)
HGB: 11 g/dL — ABNORMAL LOW (ref 12.0–16.0)
Lymphocyte #: 2.7 x10 3/mm (ref 1.0–3.6)
MCV: 100 fL (ref 80–100)
Monocyte #: 1 x10 3/mm — ABNORMAL HIGH (ref 0.2–0.9)
Monocyte %: 7.5 %
Neutrophil %: 69.5 %
Platelet: 323 x10 3/mm (ref 150–440)
RBC: 3.06 10*6/uL — ABNORMAL LOW (ref 3.80–5.20)
WBC: 13.3 x10 3/mm — ABNORMAL HIGH (ref 3.6–11.0)

## 2013-04-04 NOTE — Telephone Encounter (Signed)
Patient was contacted to make her aware of surgery that has been scheduled with Dr. Evette Cristal and Dr. Thelma Barge to assist on 04-26-13 at [facility]. She is aware of instructions and will call the office if she has further questions.   Dr. Evette Cristal spoke with Dr. Thelma Barge today and he is aware of surgery plans. Tessa at Dr. Thelma Barge office is also aware of plans.

## 2013-04-11 LAB — CBC CANCER CENTER
Basophil %: 1.3 %
Eosinophil #: 0.2 x10 3/mm (ref 0.0–0.7)
Eosinophil %: 2 %
Lymphocyte #: 2.3 x10 3/mm (ref 1.0–3.6)
Lymphocyte %: 20.3 %
MCV: 101 fL — ABNORMAL HIGH (ref 80–100)
Monocyte #: 0.7 x10 3/mm (ref 0.2–0.9)
Monocyte %: 6.2 %
Neutrophil #: 7.8 x10 3/mm — ABNORMAL HIGH (ref 1.4–6.5)
Neutrophil %: 70.2 %
Platelet: 315 x10 3/mm (ref 150–440)
RBC: 3.05 10*6/uL — ABNORMAL LOW (ref 3.80–5.20)
WBC: 11.1 x10 3/mm — ABNORMAL HIGH (ref 3.6–11.0)

## 2013-04-17 ENCOUNTER — Other Ambulatory Visit: Payer: Self-pay | Admitting: General Surgery

## 2013-04-19 ENCOUNTER — Encounter: Payer: Self-pay | Admitting: General Surgery

## 2013-04-19 ENCOUNTER — Ambulatory Visit: Payer: Self-pay | Admitting: General Surgery

## 2013-04-19 LAB — BASIC METABOLIC PANEL
Anion Gap: 7 (ref 7–16)
Calcium, Total: 10.3 mg/dL — ABNORMAL HIGH (ref 8.5–10.1)
Chloride: 104 mmol/L (ref 98–107)
Creatinine: 1.04 mg/dL (ref 0.60–1.30)
EGFR (African American): >60
EGFR (Non-African Amer.): >60
Osmolality: 277 (ref 275–301)

## 2013-04-26 ENCOUNTER — Inpatient Hospital Stay: Payer: Self-pay | Admitting: General Surgery

## 2013-04-26 DIAGNOSIS — C3410 Malignant neoplasm of upper lobe, unspecified bronchus or lung: Secondary | ICD-10-CM

## 2013-04-28 ENCOUNTER — Encounter: Payer: Self-pay | Admitting: General Surgery

## 2013-04-28 LAB — CBC WITH DIFFERENTIAL/PLATELET
Eosinophil #: 0.4 10*3/uL (ref 0.0–0.7)
Eosinophil %: 2.4 %
HCT: 30.3 % — ABNORMAL LOW (ref 35.0–47.0)
HGB: 10.3 g/dL — ABNORMAL LOW (ref 12.0–16.0)
MCV: 101 fL — ABNORMAL HIGH (ref 80–100)
Monocyte #: 1.1 x10 3/mm — ABNORMAL HIGH (ref 0.2–0.9)
Monocyte %: 7.2 %
Neutrophil #: 13.3 10*3/uL — ABNORMAL HIGH (ref 1.4–6.5)
Neutrophil %: 83.3 %
RBC: 3 10*6/uL — ABNORMAL LOW (ref 3.80–5.20)

## 2013-04-29 LAB — CBC WITH DIFFERENTIAL/PLATELET
Basophil #: 0 10*3/uL (ref 0.0–0.1)
Eosinophil %: 6.2 %
HCT: 29.3 % — ABNORMAL LOW (ref 35.0–47.0)
HGB: 10.3 g/dL — ABNORMAL LOW (ref 12.0–16.0)
MCH: 34.3 pg — ABNORMAL HIGH (ref 26.0–34.0)
Monocyte #: 1.1 x10 3/mm — ABNORMAL HIGH (ref 0.2–0.9)
Monocyte %: 10.1 %
Neutrophil #: 8.4 10*3/uL — ABNORMAL HIGH (ref 1.4–6.5)
Neutrophil %: 74.4 %
Platelet: 228 10*3/uL (ref 150–440)
RDW: 14.9 % — ABNORMAL HIGH (ref 11.5–14.5)
WBC: 11.3 10*3/uL — ABNORMAL HIGH (ref 3.6–11.0)

## 2013-05-01 ENCOUNTER — Ambulatory Visit: Payer: Self-pay | Admitting: Oncology

## 2013-05-02 ENCOUNTER — Encounter: Payer: Self-pay | Admitting: General Surgery

## 2013-05-04 ENCOUNTER — Encounter: Payer: Self-pay | Admitting: General Surgery

## 2013-05-04 ENCOUNTER — Ambulatory Visit: Payer: Self-pay | Admitting: General Surgery

## 2013-05-04 ENCOUNTER — Ambulatory Visit (INDEPENDENT_AMBULATORY_CARE_PROVIDER_SITE_OTHER): Payer: 59 | Admitting: General Surgery

## 2013-05-04 DIAGNOSIS — C3410 Malignant neoplasm of upper lobe, unspecified bronchus or lung: Secondary | ICD-10-CM

## 2013-05-04 LAB — EXPECTORATED SPUTUM ASSESSMENT W REFEX TO RESP CULTURE

## 2013-05-04 NOTE — Patient Instructions (Addendum)
Patient to return in one month. No exertional activity. Can drive when she is pain free.  This patient will have chest x-ray at [facility] today.

## 2013-05-04 NOTE — Progress Notes (Signed)
This is an 52 year female here for her post op right lung upper lobectomy  done on 04/26/13.Patient states she is doing well. Only c/o a sore spot left heel. She is looking very good. Chest incision is clean and intact. Lungs clear with good sounds on right side. Left heel with mild reddened skin, covered with duoderm today. Advised to change this every 3 days.  This patient will have a chest x-ray single view at [facility] today.  She will return to the office for follow up in one month.

## 2013-05-05 LAB — PATHOLOGY REPORT

## 2013-05-10 LAB — CBC CANCER CENTER
Basophil #: 0.1 x10 3/mm (ref 0.0–0.1)
HCT: 35.8 % (ref 35.0–47.0)
Lymphocyte #: 2.7 x10 3/mm (ref 1.0–3.6)
Lymphocyte %: 18.3 %
MCHC: 33.9 g/dL (ref 32.0–36.0)
MCV: 97 fL (ref 80–100)
Monocyte #: 1.4 x10 3/mm — ABNORMAL HIGH (ref 0.2–0.9)
Monocyte %: 9.6 %
Platelet: 470 x10 3/mm — ABNORMAL HIGH (ref 150–440)
RDW: 14.9 % — ABNORMAL HIGH (ref 11.5–14.5)
WBC: 14.6 x10 3/mm — ABNORMAL HIGH (ref 3.6–11.0)

## 2013-05-10 LAB — COMPREHENSIVE METABOLIC PANEL
Albumin: 3.7 g/dL (ref 3.4–5.0)
Alkaline Phosphatase: 154 U/L — ABNORMAL HIGH (ref 50–136)
Anion Gap: 11 (ref 7–16)
BUN: 14 mg/dL (ref 7–18)
Bilirubin,Total: 0.2 mg/dL (ref 0.2–1.0)
Calcium, Total: 10 mg/dL (ref 8.5–10.1)
Chloride: 101 mmol/L (ref 98–107)
Creatinine: 1.02 mg/dL (ref 0.60–1.30)
EGFR (African American): >60
EGFR (Non-African Amer.): >60
Glucose: 142 mg/dL — ABNORMAL HIGH (ref 65–99)
Potassium: 3.7 mmol/L (ref 3.5–5.1)
SGOT(AST): 17 U/L (ref 15–37)
Sodium: 138 mmol/L (ref 136–145)
Total Protein: 8.3 g/dL — ABNORMAL HIGH (ref 6.4–8.2)

## 2013-05-17 LAB — COMPREHENSIVE METABOLIC PANEL
Albumin: 3.8 g/dL (ref 3.4–5.0)
Alkaline Phosphatase: 123 U/L (ref 50–136)
BUN: 10 mg/dL (ref 7–18)
Bilirubin,Total: 0.4 mg/dL (ref 0.2–1.0)
Calcium, Total: 9.6 mg/dL (ref 8.5–10.1)
Chloride: 102 mmol/L (ref 98–107)
Co2: 27 mmol/L (ref 21–32)
Creatinine: 1.11 mg/dL (ref 0.60–1.30)
EGFR (Non-African Amer.): 57 — ABNORMAL LOW
Osmolality: 283 (ref 275–301)
Potassium: 3.5 mmol/L (ref 3.5–5.1)
SGPT (ALT): 22 U/L (ref 12–78)
Sodium: 140 mmol/L (ref 136–145)

## 2013-05-17 LAB — CBC CANCER CENTER
Basophil #: 0.2 x10 3/mm — ABNORMAL HIGH (ref 0.0–0.1)
Basophil %: 1.6 %
Eosinophil %: 12.3 %
Lymphocyte #: 2.7 x10 3/mm (ref 1.0–3.6)
Lymphocyte %: 23.6 %
MCH: 33.1 pg (ref 26.0–34.0)
MCHC: 34.2 g/dL (ref 32.0–36.0)
MCV: 97 fL (ref 80–100)
Monocyte #: 0.6 x10 3/mm (ref 0.2–0.9)
Monocyte %: 5.3 %
Neutrophil #: 6.5 x10 3/mm (ref 1.4–6.5)
Neutrophil %: 57.2 %
RBC: 3.64 10*6/uL — ABNORMAL LOW (ref 3.80–5.20)
RDW: 14.8 % — ABNORMAL HIGH (ref 11.5–14.5)

## 2013-05-24 LAB — CBC CANCER CENTER
Basophil #: 0.1 x10 3/mm (ref 0.0–0.1)
Basophil %: 1.2 %
Eosinophil #: 0.9 x10 3/mm — ABNORMAL HIGH (ref 0.0–0.7)
Eosinophil %: 9.1 %
HCT: 33.9 % — ABNORMAL LOW (ref 35.0–47.0)
HGB: 11.3 g/dL — ABNORMAL LOW (ref 12.0–16.0)
Lymphocyte #: 2.2 x10 3/mm (ref 1.0–3.6)
Lymphocyte %: 21.8 %
MCH: 32.3 pg (ref 26.0–34.0)
Monocyte %: 6.8 %
Neutrophil #: 6.3 x10 3/mm (ref 1.4–6.5)
Platelet: 288 x10 3/mm (ref 150–440)
RBC: 3.5 10*6/uL — ABNORMAL LOW (ref 3.80–5.20)
RDW: 15.5 % — ABNORMAL HIGH (ref 11.5–14.5)
WBC: 10.2 x10 3/mm (ref 3.6–11.0)

## 2013-05-31 ENCOUNTER — Ambulatory Visit: Payer: Self-pay | Admitting: Oncology

## 2013-05-31 LAB — CBC CANCER CENTER
Basophil #: 0.1 x10 3/mm (ref 0.0–0.1)
Basophil %: 1.9 %
Eosinophil #: 0.3 x10 3/mm (ref 0.0–0.7)
HCT: 31.4 % — ABNORMAL LOW (ref 35.0–47.0)
HGB: 11 g/dL — ABNORMAL LOW (ref 12.0–16.0)
Lymphocyte #: 2.1 x10 3/mm (ref 1.0–3.6)
MCH: 33.4 pg (ref 26.0–34.0)
MCHC: 35 g/dL (ref 32.0–36.0)
MCV: 95 fL (ref 80–100)
Monocyte #: 0.5 x10 3/mm (ref 0.2–0.9)
Monocyte %: 7.1 %
Neutrophil %: 59 %
Platelet: 328 x10 3/mm (ref 150–440)
RDW: 15.5 % — ABNORMAL HIGH (ref 11.5–14.5)
WBC: 7.4 x10 3/mm (ref 3.6–11.0)

## 2013-05-31 LAB — COMPREHENSIVE METABOLIC PANEL
Albumin: 3.5 g/dL (ref 3.4–5.0)
Alkaline Phosphatase: 119 U/L (ref 50–136)
Anion Gap: 4 — ABNORMAL LOW (ref 7–16)
Bilirubin,Total: 0.3 mg/dL (ref 0.2–1.0)
Chloride: 106 mmol/L (ref 98–107)
Co2: 27 mmol/L (ref 21–32)
Creatinine: 0.84 mg/dL (ref 0.60–1.30)
EGFR (African American): >60
EGFR (Non-African Amer.): >60
Glucose: 124 mg/dL — ABNORMAL HIGH (ref 65–99)
Osmolality: 274 (ref 275–301)
Potassium: 3.7 mmol/L (ref 3.5–5.1)
SGOT(AST): 14 U/L — ABNORMAL LOW (ref 15–37)
SGPT (ALT): 26 U/L (ref 12–78)
Sodium: 137 mmol/L (ref 136–145)

## 2013-06-06 ENCOUNTER — Encounter: Payer: Self-pay | Admitting: General Surgery

## 2013-06-06 ENCOUNTER — Ambulatory Visit (INDEPENDENT_AMBULATORY_CARE_PROVIDER_SITE_OTHER): Payer: 59 | Admitting: General Surgery

## 2013-06-06 DIAGNOSIS — C3410 Malignant neoplasm of upper lobe, unspecified bronchus or lung: Secondary | ICD-10-CM

## 2013-06-06 DIAGNOSIS — C50919 Malignant neoplasm of unspecified site of unspecified female breast: Secondary | ICD-10-CM

## 2013-06-06 NOTE — Patient Instructions (Addendum)
Call office for any new breast issues or concerns. 

## 2013-06-06 NOTE — Progress Notes (Signed)
Patient ID: Brittney Lam, female   DOB: 01-17-1961, 52 y.o.   MRN: [id number]  Chief Complaint  Patient presents with  . Routine Post Op    HPI Brittney Lam is a 52 y.o. female.  here for her post op right lung upper lobectomy done on 04/26/13.Patient states she is doing well tolerating chemotherapy. Blisters developed 05-27-13 at incision sites and Dr. Doylene Canning placed her on an antibiotic.  The areas seem to be improving.  HPI  Past Medical History  Diagnosis Date  . Unspecified essential hypertension   . Kidney failure   . Personal history of tobacco use, presenting hazards to health   . Cataract   . Personal history of malignant neoplasm of breast   . Breast screening, unspecified   . Special screening for malignant neoplasms, colon   . Cancer 2001    left breast cancer s/p L/SN/R in 2001  . Cancer 2014    right breast    Past Surgical History  Procedure Laterality Date  . Breast lumpectomy Left 2001  . Intercostal nerve block  2005  . Cataract extraction w/ intraocular lens implant  2005  . Carpal tunnel release Right 2011  . Insertion central venous access device w/ subcutaneous port  12-07-12    History reviewed. No pertinent family history.  Social History History  Substance Use Topics  . Smoking status: Former Smoker -- 0.50 packs/day for 30 years  . Smokeless tobacco: Never Used  . Alcohol Use: No    Allergies  Allergen Reactions  . No Known Allergies     Current Outpatient Prescriptions  Medication Sig Dispense Refill  . acetaminophen (TYLENOL) 325 MG tablet Take 650 mg by mouth every 6 (six) hours as needed for pain.      Marland Kitchen doxycycline (DORYX) 100 MG EC tablet Take 100 mg by mouth 2 (two) times daily.      . hydrochlorothiazide (HYDRODIURIL) 25 MG tablet Take 25 mg by mouth daily.      . Multiple Vitamins-Minerals (MULTIVITAMIN PO) Take by mouth.      . promethazine (PHENERGAN) 25 MG tablet Take 25 mg by mouth every 6 (six) hours as needed for nausea.       . traMADol (ULTRAM) 50 MG tablet Take 50 mg by mouth every 6 (six) hours as needed for pain.       No current facility-administered medications for this visit.    Review of Systems Review of Systems  Constitutional: Positive for fever (started last Saturday).  Respiratory: Negative.   Cardiovascular: Negative.     Blood pressure 126/66, pulse 88, resp. rate 16, height 5\' 2"  (1.575 m), weight 147 lb (66.679 kg).  Physical Exam Physical Exam  Constitutional: She is oriented to person, place, and time. She appears well-developed and well-nourished.  Cardiovascular: Normal rate and regular rhythm.   Pulmonary/Chest: Effort normal and breath sounds normal. Right breast exhibits no inverted nipple, no mass, no nipple discharge, no skin change and no tenderness.  Chest incision clean and well healed.  Neurological: She is alert and oriented to person, place, and time.  Skin: Skin is warm and dry.    Data Reviewed none  Assessment    Chest incision clean and well healed.     Plan    Continue antibiotic therapy and chemotherapy as scheduled. Follow up in 3 weeks.   Discuss with Dr Meriam Sprague options for reconstruction -following completion of chemo   SANKAR,SEEPLAPUTHUR G 06/06/2013, 3:08 PM

## 2013-06-07 LAB — CBC CANCER CENTER
Basophil #: 0.1 x10 3/mm (ref 0.0–0.1)
Basophil %: 1.4 %
Eosinophil %: 1.6 %
HGB: 11.2 g/dL — ABNORMAL LOW (ref 12.0–16.0)
Lymphocyte #: 2.7 x10 3/mm (ref 1.0–3.6)
Lymphocyte %: 28.2 %
MCH: 33.4 pg (ref 26.0–34.0)
MCV: 97 fL (ref 80–100)
Monocyte #: 0.8 x10 3/mm (ref 0.2–0.9)
Neutrophil %: 60.4 %
RBC: 3.36 10*6/uL — ABNORMAL LOW (ref 3.80–5.20)
WBC: 9.6 x10 3/mm (ref 3.6–11.0)

## 2013-06-14 LAB — CBC CANCER CENTER
Eosinophil #: 0.2 x10 3/mm (ref 0.0–0.7)
Eosinophil %: 2.1 %
HCT: 32.8 % — ABNORMAL LOW (ref 35.0–47.0)
Lymphocyte %: 28.7 %
MCHC: 34.6 g/dL (ref 32.0–36.0)
MCV: 98 fL (ref 80–100)
Monocyte %: 7.3 %
Neutrophil #: 5.7 x10 3/mm (ref 1.4–6.5)
Neutrophil %: 61.6 %
Platelet: 377 x10 3/mm (ref 150–440)
RBC: 3.36 10*6/uL — ABNORMAL LOW (ref 3.80–5.20)
RDW: 16.2 % — ABNORMAL HIGH (ref 11.5–14.5)
WBC: 9.3 x10 3/mm (ref 3.6–11.0)

## 2013-06-14 LAB — COMPREHENSIVE METABOLIC PANEL
BUN: 8 mg/dL (ref 7–18)
Osmolality: 282 (ref 275–301)
Potassium: 3 mmol/L — ABNORMAL LOW (ref 3.5–5.1)
SGPT (ALT): 31 U/L (ref 12–78)
Total Protein: 7.3 g/dL (ref 6.4–8.2)

## 2013-06-26 ENCOUNTER — Ambulatory Visit (INDEPENDENT_AMBULATORY_CARE_PROVIDER_SITE_OTHER): Payer: 59 | Admitting: General Surgery

## 2013-06-26 ENCOUNTER — Encounter: Payer: Self-pay | Admitting: General Surgery

## 2013-06-26 DIAGNOSIS — C3410 Malignant neoplasm of upper lobe, unspecified bronchus or lung: Secondary | ICD-10-CM

## 2013-06-26 DIAGNOSIS — C50919 Malignant neoplasm of unspecified site of unspecified female breast: Secondary | ICD-10-CM

## 2013-06-26 NOTE — Patient Instructions (Signed)
Patient to talk with the plastic surgeon.

## 2013-06-26 NOTE — Progress Notes (Signed)
Patient here for her post op right lung upper lobectomy done on 04/26/13.Patient states she is doing well. Patient has completed her chemo right breast . Patient is doing well and the right lobectomy site looks clean and no sign of infection. Lungs are clear . Right breast  with no  palpable firmness or mass. Has had excellent response to chemo.  Discussed right mastectomy. Pt is willing to consider reconstruction also. Appointment to be made for her to [facility]-Dr. Meriam Sprague.

## 2013-06-28 ENCOUNTER — Telehealth: Payer: Self-pay | Admitting: *Deleted

## 2013-06-28 NOTE — Telephone Encounter (Signed)
Patient reports that she has an appointment to see Dr. Meriam Sprague on 07-19-13. She states she is on their cancellation list and was instructed to call our office if this appointment gets moved up. We will plan on seeing her here in the office to discuss right breast mastectomy on 07-20-13.

## 2013-06-28 NOTE — Telephone Encounter (Signed)
Message left for patient to call the office.   We need to find out if Dr. Margarita Sermons office has scheduled an appointment for evaluation yet. If so, we need to see patient in the office after Dr. Margarita Sermons appointment to discuss surgery. If not, we need to follow up with Dr. Margarita Sermons office per Dr. Evette Cristal.

## 2013-07-01 ENCOUNTER — Ambulatory Visit: Payer: Self-pay | Admitting: Oncology

## 2013-07-04 ENCOUNTER — Telehealth: Payer: Self-pay | Admitting: *Deleted

## 2013-07-04 NOTE — Telephone Encounter (Signed)
Per Brittney Lam, patient called the office to report Dr. Margarita Lam office contacted her to move appointment up. She is now going to see Dr. Meriam Lam tomorrow, Brittney Lam at 2:30 pm. We will plan on seeing this patient in the office on Friday, 07-07-13, to discuss surgery.

## 2013-07-06 ENCOUNTER — Other Ambulatory Visit: Payer: Self-pay

## 2013-07-07 ENCOUNTER — Ambulatory Visit (INDEPENDENT_AMBULATORY_CARE_PROVIDER_SITE_OTHER): Payer: 59 | Admitting: General Surgery

## 2013-07-07 ENCOUNTER — Encounter: Payer: Self-pay | Admitting: General Surgery

## 2013-07-07 VITALS — BP 110/70 | HR 80 | Resp 12 | Ht 62.0 in | Wt 151.0 lb

## 2013-07-07 DIAGNOSIS — C50919 Malignant neoplasm of unspecified site of unspecified female breast: Secondary | ICD-10-CM

## 2013-07-07 NOTE — Progress Notes (Signed)
Patient ID: Brittney Lam, female   DOB: 17-Mar-1961, 52 y.o.   MRN: [id number] The patient had consultation with plastic surgery and it was felt that I given the fact that she may need additional treatment for right breast after mastectomy it was reasonable to wait 6 months to a year before considering reconstruction on both sides. Patient came in today to discuss plans for mastectomy and right. Procedure risks and nature of sentinel node biopsy were all discussed in full with the patient and she is agreeable to proceed in the near future. Plan is for a right total mastectomy with sentinel node biopsy, possibility for axillary dissection.

## 2013-07-11 ENCOUNTER — Telehealth: Payer: Self-pay | Admitting: *Deleted

## 2013-07-11 NOTE — Telephone Encounter (Signed)
Regarding FMLA papers.

## 2013-07-12 NOTE — Telephone Encounter (Signed)
Insurance papers completed

## 2013-07-20 ENCOUNTER — Ambulatory Visit: Payer: 59 | Admitting: General Surgery

## 2013-07-20 ENCOUNTER — Ambulatory Visit: Payer: Self-pay | Admitting: General Surgery

## 2013-07-20 LAB — CBC WITH DIFFERENTIAL/PLATELET
Basophil #: 0.2 10*3/uL — ABNORMAL HIGH (ref 0.0–0.1)
Basophil %: 1.3 %
Eosinophil %: 4.9 %
HCT: 37.5 % (ref 35.0–47.0)
HGB: 12.8 g/dL (ref 12.0–16.0)
MCH: 32.6 pg (ref 26.0–34.0)
MCHC: 34.1 g/dL (ref 32.0–36.0)
Monocyte #: 1.3 x10 3/mm — ABNORMAL HIGH (ref 0.2–0.9)
Monocyte %: 10.9 %
Neutrophil #: 7.5 10*3/uL — ABNORMAL HIGH (ref 1.4–6.5)
Neutrophil %: 61.3 %
RDW: 16.1 % — ABNORMAL HIGH (ref 11.5–14.5)
WBC: 12.2 10*3/uL — ABNORMAL HIGH (ref 3.6–11.0)

## 2013-07-20 LAB — LAB REPORT - SCANNED
ALT: 26 U/L (ref 7–35)
AST: 28 U/L (ref 13–35)
Alkaline Phosphatase: 125 U/L (ref 25–125)
BUN: 15
Glucose: 86 mg/dL
Hgb: 12.8
MCHC: 34 g/dL (ref 30–37)
Platelets: 277
Potassium: 3.8 mmol/L (ref 3.4–5.3)
RDW: 16.1 % — AB (ref 11.5–14.5)

## 2013-07-20 LAB — COMPREHENSIVE METABOLIC PANEL
Albumin: 4 g/dL (ref 3.4–5.0)
Anion Gap: 5 — ABNORMAL LOW (ref 7–16)
BUN: 15 mg/dL (ref 7–18)
Bilirubin,Total: 0.4 mg/dL (ref 0.2–1.0)
Calcium, Total: 10.7 mg/dL — ABNORMAL HIGH (ref 8.5–10.1)
Co2: 30 mmol/L (ref 21–32)
Creatinine: 0.87 mg/dL (ref 0.60–1.30)
EGFR (African American): >60
Glucose: 86 mg/dL (ref 65–99)
Osmolality: 270 (ref 275–301)
Sodium: 135 mmol/L — ABNORMAL LOW (ref 136–145)

## 2013-07-21 ENCOUNTER — Encounter: Payer: Self-pay | Admitting: General Surgery

## 2013-07-25 ENCOUNTER — Ambulatory Visit: Payer: Self-pay | Admitting: General Surgery

## 2013-07-25 ENCOUNTER — Encounter: Payer: Self-pay | Admitting: General Surgery

## 2013-07-25 DIAGNOSIS — C50919 Malignant neoplasm of unspecified site of unspecified female breast: Secondary | ICD-10-CM

## 2013-07-26 ENCOUNTER — Telehealth: Payer: Self-pay | Admitting: *Deleted

## 2013-07-26 NOTE — Telephone Encounter (Signed)
May change gauze dressing as needed. Continue to wear surgical bra.  Record drain output.  No showers until seen by MD next week, pt agrees.

## 2013-07-28 LAB — PATHOLOGY REPORT

## 2013-07-31 ENCOUNTER — Encounter: Payer: Self-pay | Admitting: General Surgery

## 2013-08-01 ENCOUNTER — Encounter: Payer: Self-pay | Admitting: General Surgery

## 2013-08-01 ENCOUNTER — Ambulatory Visit (INDEPENDENT_AMBULATORY_CARE_PROVIDER_SITE_OTHER): Payer: 59 | Admitting: General Surgery

## 2013-08-01 VITALS — BP 116/70 | HR 76 | Resp 16 | Ht 62.0 in | Wt 151.0 lb

## 2013-08-01 DIAGNOSIS — C50911 Malignant neoplasm of unspecified site of right female breast: Secondary | ICD-10-CM

## 2013-08-01 DIAGNOSIS — C50919 Malignant neoplasm of unspecified site of unspecified female breast: Secondary | ICD-10-CM

## 2013-08-01 NOTE — Patient Instructions (Addendum)
Patient to return on Thursday 08/03/13 to see Mindi Junker. Patient to return in 2 weeks to see Dr. Evette Cristal.

## 2013-08-01 NOTE — Progress Notes (Signed)
Patient ID: Brittney Lam, female   DOB: November 23, 1960, 52 y.o.   MRN: [id number]  The patient presents for a post op evaluation of a right total mastectomy. The procedure was performed on 07/25/13.  The patient states she is doing well.    Mastectomy site is clean and healing well. The drain had   clots in the tubing and this was milked out. Likely needs to stay for few more days.  Path report: The residual 8 cm invasive cancer in the right breast. 1 /4  sentinel nodes with 2.2 mm focus of metastatic cancer. Patient advised of these findings. Will discuss the case into my case conference and decide about whether she would benefit with radiation.

## 2013-08-03 ENCOUNTER — Ambulatory Visit (INDEPENDENT_AMBULATORY_CARE_PROVIDER_SITE_OTHER): Payer: 59 | Admitting: *Deleted

## 2013-08-03 ENCOUNTER — Encounter: Payer: Self-pay | Admitting: *Deleted

## 2013-08-03 DIAGNOSIS — C50919 Malignant neoplasm of unspecified site of unspecified female breast: Secondary | ICD-10-CM

## 2013-08-03 NOTE — Patient Instructions (Signed)
Follow up as scheduled.  

## 2013-08-03 NOTE — Progress Notes (Signed)
She is here today for drain removal.  Output has been    For the past 3 days. Drain removed dressing applied, no signs of infection noted. Follow up with Md as scheduled.

## 2013-08-04 ENCOUNTER — Ambulatory Visit: Payer: Self-pay | Admitting: Oncology

## 2013-08-10 ENCOUNTER — Encounter: Payer: Self-pay | Admitting: *Deleted

## 2013-08-15 ENCOUNTER — Encounter: Payer: Self-pay | Admitting: General Surgery

## 2013-08-15 ENCOUNTER — Ambulatory Visit (INDEPENDENT_AMBULATORY_CARE_PROVIDER_SITE_OTHER): Payer: 59 | Admitting: General Surgery

## 2013-08-15 VITALS — BP 110/70 | HR 68 | Resp 12 | Ht 62.0 in | Wt 151.0 lb

## 2013-08-15 DIAGNOSIS — C50919 Malignant neoplasm of unspecified site of unspecified female breast: Secondary | ICD-10-CM

## 2013-08-15 NOTE — Progress Notes (Signed)
Patient presents today for a 3 week follow up post op right mastectomy. The procedure was performed on 07/25/13. The patient states she has a pulling sensation that radiates down her right arm.   Mastectomy site remains well healed with no seroma. No signs of infection. Pt is due to start on radiation to right mastectomy site.   3 month follow up visit. Call for any problems in the interval.

## 2013-08-15 NOTE — Patient Instructions (Signed)
Patient to return in 3 months for follow up visit. Patient to call with any new questions or concerns that arise.

## 2013-08-31 ENCOUNTER — Ambulatory Visit: Payer: Self-pay | Admitting: Oncology

## 2013-09-08 LAB — CBC CANCER CENTER
BASOS ABS: 0.1 x10 3/mm (ref 0.0–0.1)
BASOS PCT: 1.3 %
EOS ABS: 0.4 x10 3/mm (ref 0.0–0.7)
Eosinophil %: 5 %
HCT: 37.6 % (ref 35.0–47.0)
HGB: 12.9 g/dL (ref 12.0–16.0)
Lymphocyte #: 1.5 x10 3/mm (ref 1.0–3.6)
Lymphocyte %: 20.2 %
MCH: 31.4 pg (ref 26.0–34.0)
MCHC: 34.3 g/dL (ref 32.0–36.0)
MCV: 92 fL (ref 80–100)
Monocyte #: 0.6 x10 3/mm (ref 0.2–0.9)
Monocyte %: 8.7 %
NEUTROS PCT: 64.8 %
Neutrophil #: 4.8 x10 3/mm (ref 1.4–6.5)
PLATELETS: 234 x10 3/mm (ref 150–440)
RBC: 4.1 10*6/uL (ref 3.80–5.20)
RDW: 13.9 % (ref 11.5–14.5)
WBC: 7.4 x10 3/mm (ref 3.6–11.0)

## 2013-09-15 LAB — CBC CANCER CENTER
Basophil #: 0 x10 3/mm (ref 0.0–0.1)
Basophil %: 0.5 %
EOS PCT: 5.7 %
Eosinophil #: 0.4 x10 3/mm (ref 0.0–0.7)
HCT: 39.2 % (ref 35.0–47.0)
HGB: 13.1 g/dL (ref 12.0–16.0)
LYMPHS ABS: 1.4 x10 3/mm (ref 1.0–3.6)
LYMPHS PCT: 17.3 %
MCH: 31.5 pg (ref 26.0–34.0)
MCHC: 33.3 g/dL (ref 32.0–36.0)
MCV: 95 fL (ref 80–100)
Monocyte #: 0.7 x10 3/mm (ref 0.2–0.9)
Monocyte %: 9.2 %
NEUTROS PCT: 67.3 %
Neutrophil #: 5.3 x10 3/mm (ref 1.4–6.5)
PLATELETS: 193 x10 3/mm (ref 150–440)
RBC: 4.14 10*6/uL (ref 3.80–5.20)
RDW: 13.8 % (ref 11.5–14.5)
WBC: 7.8 x10 3/mm (ref 3.6–11.0)

## 2013-09-22 LAB — CBC CANCER CENTER
Basophil #: 0.1 x10 3/mm (ref 0.0–0.1)
Basophil %: 1.3 %
EOS PCT: 5.1 %
Eosinophil #: 0.4 x10 3/mm (ref 0.0–0.7)
HCT: 41.9 % (ref 35.0–47.0)
HGB: 14 g/dL (ref 12.0–16.0)
Lymphocyte #: 1.6 x10 3/mm (ref 1.0–3.6)
Lymphocyte %: 19.7 %
MCH: 31.6 pg (ref 26.0–34.0)
MCHC: 33.5 g/dL (ref 32.0–36.0)
MCV: 94 fL (ref 80–100)
Monocyte #: 0.7 x10 3/mm (ref 0.2–0.9)
Monocyte %: 9 %
Neutrophil #: 5.4 x10 3/mm (ref 1.4–6.5)
Neutrophil %: 64.9 %
Platelet: 223 x10 3/mm (ref 150–440)
RBC: 4.44 10*6/uL (ref 3.80–5.20)
RDW: 13.9 % (ref 11.5–14.5)
WBC: 8.3 x10 3/mm (ref 3.6–11.0)

## 2013-09-29 LAB — CBC CANCER CENTER
Basophil #: 0.1 x10 3/mm (ref 0.0–0.1)
Basophil %: 1.5 %
Eosinophil #: 0.3 x10 3/mm (ref 0.0–0.7)
Eosinophil %: 4.2 %
HCT: 39.9 % (ref 35.0–47.0)
HGB: 13.2 g/dL (ref 12.0–16.0)
LYMPHS ABS: 1.3 x10 3/mm (ref 1.0–3.6)
LYMPHS PCT: 16.6 %
MCH: 31.3 pg (ref 26.0–34.0)
MCHC: 33.2 g/dL (ref 32.0–36.0)
MCV: 95 fL (ref 80–100)
Monocyte #: 0.7 x10 3/mm (ref 0.2–0.9)
Monocyte %: 9.7 %
NEUTROS PCT: 68 %
Neutrophil #: 5.1 x10 3/mm (ref 1.4–6.5)
PLATELETS: 211 x10 3/mm (ref 150–440)
RBC: 4.23 10*6/uL (ref 3.80–5.20)
RDW: 13.8 % (ref 11.5–14.5)
WBC: 7.5 x10 3/mm (ref 3.6–11.0)

## 2013-10-01 ENCOUNTER — Ambulatory Visit: Payer: Self-pay | Admitting: Oncology

## 2013-10-06 LAB — CBC CANCER CENTER
Basophil #: 0.1 x10 3/mm (ref 0.0–0.1)
Basophil %: 1.1 %
EOS ABS: 0.3 x10 3/mm (ref 0.0–0.7)
EOS PCT: 5 %
HCT: 42.7 % (ref 35.0–47.0)
HGB: 14.2 g/dL (ref 12.0–16.0)
Lymphocyte #: 1.1 x10 3/mm (ref 1.0–3.6)
Lymphocyte %: 15.7 %
MCH: 31.4 pg (ref 26.0–34.0)
MCHC: 33.3 g/dL (ref 32.0–36.0)
MCV: 94 fL (ref 80–100)
MONOS PCT: 12.4 %
Monocyte #: 0.9 x10 3/mm (ref 0.2–0.9)
NEUTROS PCT: 65.8 %
Neutrophil #: 4.5 x10 3/mm (ref 1.4–6.5)
PLATELETS: 224 x10 3/mm (ref 150–440)
RBC: 4.53 10*6/uL (ref 3.80–5.20)
RDW: 13.9 % (ref 11.5–14.5)
WBC: 6.9 x10 3/mm (ref 3.6–11.0)

## 2013-10-13 LAB — CBC CANCER CENTER
BASOS ABS: 0.1 x10 3/mm (ref 0.0–0.1)
BASOS PCT: 1.1 %
EOS PCT: 3 %
Eosinophil #: 0.2 x10 3/mm (ref 0.0–0.7)
HCT: 38.9 % (ref 35.0–47.0)
HGB: 13.5 g/dL (ref 12.0–16.0)
Lymphocyte #: 0.9 x10 3/mm — ABNORMAL LOW (ref 1.0–3.6)
Lymphocyte %: 11.9 %
MCH: 32.8 pg (ref 26.0–34.0)
MCHC: 34.6 g/dL (ref 32.0–36.0)
MCV: 95 fL (ref 80–100)
MONO ABS: 0.9 x10 3/mm (ref 0.2–0.9)
Monocyte %: 12.1 %
NEUTROS ABS: 5.5 x10 3/mm (ref 1.4–6.5)
Neutrophil %: 71.9 %
Platelet: 190 x10 3/mm (ref 150–440)
RBC: 4.11 10*6/uL (ref 3.80–5.20)
RDW: 13.9 % (ref 11.5–14.5)
WBC: 7.6 x10 3/mm (ref 3.6–11.0)

## 2013-10-20 LAB — COMPREHENSIVE METABOLIC PANEL
ALBUMIN: 3.6 g/dL (ref 3.4–5.0)
Alkaline Phosphatase: 143 U/L — ABNORMAL HIGH
Anion Gap: 6 — ABNORMAL LOW (ref 7–16)
BUN: 13 mg/dL (ref 7–18)
Bilirubin,Total: 0.3 mg/dL (ref 0.2–1.0)
CO2: 30 mmol/L (ref 21–32)
Calcium, Total: 9.3 mg/dL (ref 8.5–10.1)
Chloride: 107 mmol/L (ref 98–107)
Creatinine: 0.96 mg/dL (ref 0.60–1.30)
EGFR (African American): >60
EGFR (Non-African Amer.): >60
Glucose: 119 mg/dL — ABNORMAL HIGH (ref 65–99)
Osmolality: 286 (ref 275–301)
Potassium: 4.6 mmol/L (ref 3.5–5.1)
SGOT(AST): 21 U/L (ref 15–37)
SGPT (ALT): 30 U/L (ref 12–78)
Sodium: 143 mmol/L (ref 136–145)
Total Protein: 7.6 g/dL (ref 6.4–8.2)

## 2013-10-20 LAB — CBC CANCER CENTER
BASOS ABS: 0 x10 3/mm (ref 0.0–0.1)
Basophil %: 0.7 %
EOS PCT: 3.4 %
Eosinophil #: 0.2 x10 3/mm (ref 0.0–0.7)
HCT: 40.9 % (ref 35.0–47.0)
HGB: 13.9 g/dL (ref 12.0–16.0)
LYMPHS ABS: 1 x10 3/mm (ref 1.0–3.6)
LYMPHS PCT: 13.4 %
MCH: 32.1 pg (ref 26.0–34.0)
MCHC: 33.9 g/dL (ref 32.0–36.0)
MCV: 95 fL (ref 80–100)
MONO ABS: 0.8 x10 3/mm (ref 0.2–0.9)
MONOS PCT: 11.6 %
Neutrophil #: 5.1 x10 3/mm (ref 1.4–6.5)
Neutrophil %: 70.9 %
PLATELETS: 219 x10 3/mm (ref 150–440)
RBC: 4.32 10*6/uL (ref 3.80–5.20)
RDW: 13.9 % (ref 11.5–14.5)
WBC: 7.1 x10 3/mm (ref 3.6–11.0)

## 2013-10-29 ENCOUNTER — Ambulatory Visit: Payer: Self-pay | Admitting: Oncology

## 2013-11-13 ENCOUNTER — Encounter: Payer: Self-pay | Admitting: General Surgery

## 2013-11-13 ENCOUNTER — Ambulatory Visit (INDEPENDENT_AMBULATORY_CARE_PROVIDER_SITE_OTHER): Payer: 59 | Admitting: General Surgery

## 2013-11-13 VITALS — BP 126/74 | HR 76 | Resp 14 | Ht 61.0 in | Wt 151.0 lb

## 2013-11-13 DIAGNOSIS — C50911 Malignant neoplasm of unspecified site of right female breast: Secondary | ICD-10-CM

## 2013-11-13 DIAGNOSIS — L989 Disorder of the skin and subcutaneous tissue, unspecified: Secondary | ICD-10-CM

## 2013-11-13 DIAGNOSIS — C50919 Malignant neoplasm of unspecified site of unspecified female breast: Secondary | ICD-10-CM

## 2013-11-13 DIAGNOSIS — Z853 Personal history of malignant neoplasm of breast: Secondary | ICD-10-CM

## 2013-11-13 NOTE — Patient Instructions (Signed)
  Patient to return in three month with left breast diagnotic mammogram.

## 2013-11-13 NOTE — Progress Notes (Signed)
Patient ID: Brittney Lam, female   DOB: 09/29/1960, 53 y.o.   MRN: [id number]  Chief Complaint  Patient presents with  . Follow-up    breast cancer    HPI Brittney Lam is a 53 y.o. female here today for her follow up 3 month breast cancer. Patient states she is not doing good with the letrozole which was started three weeks ago.  She has been vomiting and having head aches. Leg cramps and some skin lesions. HPI  Past Medical History  Diagnosis Date  . Unspecified essential hypertension   . Kidney failure   . Personal history of tobacco use, presenting hazards to health   . Cataract   . Personal history of malignant neoplasm of breast   . Breast screening, unspecified   . Special screening for malignant neoplasms, colon   . Cancer 2001    left breast cancer s/p L/SN/R in 2001  . Cancer 2014    right breast invasive CA, Er pos,Pr neg, Her 2 neg.    Past Surgical History  Procedure Laterality Date  . Intercostal nerve block  2005  . Cataract extraction w/ intraocular lens implant  2005  . Carpal tunnel release Right 2011  . Insertion central venous access device w/ subcutaneous port  12-07-12  . Breast lumpectomy Left 2001  . Breast surgery Right 2014    total mastectomy  . Right lung upper lobectomy  03/2013    History reviewed. No pertinent family history.  Social History History  Substance Use Topics  . Smoking status: Former Smoker -- 0.50 packs/day for 30 years  . Smokeless tobacco: Never Used  . Alcohol Use: No    Allergies  Allergen Reactions  . No Known Allergies     Current Outpatient Prescriptions  Medication Sig Dispense Refill  . acetaminophen (TYLENOL) 325 MG tablet Take 650 mg by mouth every 6 (six) hours as needed for pain.      . hydrochlorothiazide (HYDRODIURIL) 25 MG tablet Take 25 mg by mouth daily.      Marland Kitchen letrozole (FEMARA) 2.5 MG tablet Take 2.5 mg by mouth daily.      . Multiple Vitamins-Minerals (MULTIVITAMIN PO) Take by mouth.      .  oxyCODONE-acetaminophen (PERCOCET/ROXICET) 5-325 MG per tablet Take 1 tablet by mouth every 4 (four) hours as needed for severe pain.      . traMADol (ULTRAM) 50 MG tablet Take 50 mg by mouth every 6 (six) hours as needed for pain.       No current facility-administered medications for this visit.    Review of Systems Review of Systems  Constitutional: Positive for diaphoresis and fatigue. Negative for fever, chills, activity change, appetite change and unexpected weight change.  Respiratory: Positive for shortness of breath. Negative for apnea, cough, choking, chest tightness, wheezing and stridor.   Cardiovascular: Negative.  Negative for chest pain, palpitations and leg swelling.    Blood pressure 126/74, pulse 76, resp. rate 14, height 5\' 1"  (1.549 m), weight 151 lb (68.493 kg).  Physical Exam Physical Exam  Constitutional: She is oriented to person, place, and time. She appears well-developed and well-nourished.  Eyes: Conjunctivae are normal. No scleral icterus.  Neck: Neck supple.  Cardiovascular: Normal rate, regular rhythm and normal heart sounds.   Pulmonary/Chest: Breath sounds normal. Left breast exhibits no inverted nipple, no mass, no nipple discharge, no skin change and no tenderness.  Right mastectomy site is well healed , but now with skin color change from radiation.  Lymphadenopathy:    She has no cervical adenopathy.    She has no axillary adenopathy.  Neurological: She is alert and oriented to person, place, and time.  Skin: Skin is warm and dry.  5 mm  Skin lesion left leg lateral with a small drop of pus.     Data Reviewed none  Assessment   Right breast CA post chemo mastectomy and radiation and currently on anti hormone therapy. Will discuss with Dr. Oliva Bustard regarding pt's side effects from [facility].     Plan    Patient to return in three month with left breast diagnotic mammogram.       Junie Panning G 11/13/2013, 6:58 PM

## 2013-11-14 ENCOUNTER — Other Ambulatory Visit: Payer: Self-pay | Admitting: *Deleted

## 2013-11-14 DIAGNOSIS — L989 Disorder of the skin and subcutaneous tissue, unspecified: Secondary | ICD-10-CM

## 2013-11-19 LAB — ANAEROBIC AND AEROBIC CULTURE

## 2013-11-21 ENCOUNTER — Telehealth: Payer: Self-pay | Admitting: *Deleted

## 2013-11-21 MED ORDER — DOXYCYCLINE HYCLATE 100 MG PO CAPS: 100.0000 mg | ORAL_CAPSULE | Freq: Two times a day (BID) | ORAL | Status: DC

## 2013-11-21 NOTE — Telephone Encounter (Signed)
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 The skin lesions may or may not be related to the hormone. If she has other ones show up will see her again in office,. ------

## 2013-11-21 NOTE — Telephone Encounter (Signed)
Notified patient as instructed, patient pleased. Discussed follow-up appointments, patient agrees  

## 2013-11-29 ENCOUNTER — Ambulatory Visit: Payer: Self-pay | Admitting: Oncology

## 2013-12-29 ENCOUNTER — Ambulatory Visit: Payer: Self-pay | Admitting: Oncology

## 2014-02-16 ENCOUNTER — Ambulatory Visit: Payer: Self-pay | Admitting: Oncology

## 2014-02-16 LAB — COMPREHENSIVE METABOLIC PANEL
ALT: 29 U/L (ref 12–78)
Albumin: 3.7 g/dL (ref 3.4–5.0)
Alkaline Phosphatase: 128 U/L — ABNORMAL HIGH
Anion Gap: 6 — ABNORMAL LOW (ref 7–16)
BUN: 18 mg/dL (ref 7–18)
Bilirubin,Total: 0.3 mg/dL (ref 0.2–1.0)
CREATININE: 0.96 mg/dL (ref 0.60–1.30)
Calcium, Total: 9.6 mg/dL (ref 8.5–10.1)
Chloride: 103 mmol/L (ref 98–107)
Co2: 32 mmol/L (ref 21–32)
EGFR (African American): >60
Glucose: 193 mg/dL — ABNORMAL HIGH (ref 65–99)
Osmolality: 288 (ref 275–301)
Potassium: 3.9 mmol/L (ref 3.5–5.1)
SGOT(AST): 21 U/L (ref 15–37)
SODIUM: 141 mmol/L (ref 136–145)
Total Protein: 7.7 g/dL (ref 6.4–8.2)

## 2014-02-16 LAB — CBC CANCER CENTER
BASOS PCT: 1.4 %
Basophil #: 0.1 x10 3/mm (ref 0.0–0.1)
EOS PCT: 5.4 %
Eosinophil #: 0.5 x10 3/mm (ref 0.0–0.7)
HCT: 38.5 % (ref 35.0–47.0)
HGB: 13.1 g/dL (ref 12.0–16.0)
LYMPHS PCT: 18.8 %
Lymphocyte #: 1.7 x10 3/mm (ref 1.0–3.6)
MCH: 32.5 pg (ref 26.0–34.0)
MCHC: 33.9 g/dL (ref 32.0–36.0)
MCV: 96 fL (ref 80–100)
MONOS PCT: 10.4 %
Monocyte #: 0.9 x10 3/mm (ref 0.2–0.9)
Neutrophil #: 5.6 x10 3/mm (ref 1.4–6.5)
Neutrophil %: 64 %
Platelet: 275 x10 3/mm (ref 150–440)
RBC: 4.02 10*6/uL (ref 3.80–5.20)
RDW: 13.7 % (ref 11.5–14.5)
WBC: 8.8 x10 3/mm (ref 3.6–11.0)

## 2014-02-17 LAB — CANCER ANTIGEN 27.29: CA 27.29: 21.8 U/mL (ref 0.0–38.6)

## 2014-02-28 ENCOUNTER — Ambulatory Visit: Payer: Self-pay | Admitting: Oncology

## 2014-03-15 ENCOUNTER — Ambulatory Visit: Payer: Self-pay | Admitting: General Surgery

## 2014-03-15 DIAGNOSIS — M545 Low back pain, unspecified: Secondary | ICD-10-CM | POA: Insufficient documentation

## 2014-03-16 ENCOUNTER — Encounter: Payer: Self-pay | Admitting: General Surgery

## 2014-03-21 ENCOUNTER — Encounter: Payer: Self-pay | Admitting: General Surgery

## 2014-03-21 ENCOUNTER — Ambulatory Visit (INDEPENDENT_AMBULATORY_CARE_PROVIDER_SITE_OTHER): Payer: 59 | Admitting: General Surgery

## 2014-03-21 VITALS — BP 140/72 | HR 76 | Resp 12 | Ht 62.5 in | Wt 155.0 lb

## 2014-03-21 DIAGNOSIS — C3410 Malignant neoplasm of upper lobe, unspecified bronchus or lung: Secondary | ICD-10-CM

## 2014-03-21 DIAGNOSIS — Z853 Personal history of malignant neoplasm of breast: Secondary | ICD-10-CM

## 2014-03-21 DIAGNOSIS — C50911 Malignant neoplasm of unspecified site of right female breast: Secondary | ICD-10-CM

## 2014-03-21 DIAGNOSIS — C3411 Malignant neoplasm of upper lobe, right bronchus or lung: Secondary | ICD-10-CM

## 2014-03-21 DIAGNOSIS — C50919 Malignant neoplasm of unspecified site of unspecified female breast: Secondary | ICD-10-CM

## 2014-03-21 NOTE — Progress Notes (Signed)
Patient ID: Brittney Lam, female   DOB: [birth year], 53 y.o.   MRN: [id number]  Chief Complaint  Patient presents with  . Follow-up    3 month follow up left screening mammogram    HPI Brittney Lam is a 53 y.o. female who presents for breast and lung cancer f/u.Marland Kitchen The most recent mammogram was done on 03/15/14. Patient does perform regular self breast checks and gets regular mammograms done. She denies any problems with the breast at this time.    HPI  Past Medical History  Diagnosis Date  . Unspecified essential hypertension   . Kidney failure   . Personal history of tobacco use, presenting hazards to health   . Cataract   . Personal history of malignant neoplasm of breast   . Breast screening, unspecified   . Special screening for malignant neoplasms, colon   . Cancer 2001    left breast cancer s/p L/SN/R in 2001  . Cancer 2014    right breast invasive CA, Er pos,Pr neg, Her 2 neg.  . Lung cancer, upper lobe 2014    right upper lobe    Past Surgical History  Procedure Laterality Date  . Intercostal nerve block  2005  . Cataract extraction w/ intraocular lens implant  2005  . Carpal tunnel release Right 2011  . Insertion central venous access device w/ subcutaneous port  12-07-12  . Breast lumpectomy Left 2001  . Breast surgery Right 2014    total mastectomy  . Right lung upper lobectomy  03/2013    History reviewed. No pertinent family history.  Social History History  Substance Use Topics  . Smoking status: Former Smoker -- 0.50 packs/day for 30 years  . Smokeless tobacco: Never Used  . Alcohol Use: No    Allergies  Allergen Reactions  . No Known Allergies     Current Outpatient Prescriptions  Medication Sig Dispense Refill  . acetaminophen (TYLENOL) 325 MG tablet Take 650 mg by mouth every 6 (six) hours as needed for pain.      . hydrochlorothiazide (HYDRODIURIL) 25 MG tablet Take 25 mg by mouth daily.      Marland Kitchen letrozole (FEMARA) 2.5 MG tablet Take 2.5  mg by mouth daily.      . Multiple Vitamins-Minerals (MULTIVITAMIN PO) Take by mouth.      Marland Kitchen PARoxetine Mesylate (BRISDELLE PO) Take 1 tablet by mouth at bedtime.       No current facility-administered medications for this visit.    Review of Systems Review of Systems  Constitutional: Negative.   Respiratory: Negative.   Cardiovascular: Negative.     Blood pressure 140/72, pulse 76, resp. rate 12, height 5' 2.5" (1.588 m), weight 155 lb (70.308 kg).  Physical Exam Physical Exam  Constitutional: She is oriented to person, place, and time. She appears well-developed and well-nourished.  Eyes: Conjunctivae are normal. No scleral icterus.  Neck: Neck supple. No tracheal deviation present. No thyromegaly present.  Cardiovascular: Normal rate, regular rhythm and normal heart sounds.   No murmur heard. Pulmonary/Chest: Effort normal and breath sounds normal.   Right breast exhibits no inverted nipple, no mass, no nipple discharge, no skin change and no tenderness. Left breast exhibits no inverted nipple, no mass, no nipple discharge, no skin change and no tenderness.    Abdominal: Soft. Normal appearance and bowel sounds are normal. There is no hepatosplenomegaly. There is no tenderness. No hernia.  Musculoskeletal:       Arms: Lymphadenopathy:  She has no cervical adenopathy.    She has no axillary adenopathy.  Neurological: She is alert and oriented to person, place, and time.  Skin: Skin is warm and dry.    Data Reviewed  Mammogram reviewed. Stable  Assessment    Stable exam. Bilateral breast Cancer, right lung cancer. Currently on letrazole. Left arm swelling that is new.    Plan    Patient to return in 6 months for follow up. Will discuss with Dr. Oliva Bustard regarding left arm swelling.       SANKAR,SEEPLAPUTHUR G 03/21/2014, 8:38 PM

## 2014-03-21 NOTE — Patient Instructions (Signed)
Patient to return in 6 months for follow up. The patient is aware to call back for any questions or concerns.

## 2014-03-31 ENCOUNTER — Ambulatory Visit: Payer: Self-pay | Admitting: Oncology

## 2014-05-15 ENCOUNTER — Encounter: Payer: Self-pay | Admitting: Oncology

## 2014-05-30 ENCOUNTER — Ambulatory Visit: Payer: Self-pay | Admitting: Oncology

## 2014-05-30 LAB — CBC CANCER CENTER
BASOS ABS: 0.2 x10 3/mm — AB (ref 0.0–0.1)
BASOS PCT: 2 %
EOS ABS: 0.3 x10 3/mm (ref 0.0–0.7)
Eosinophil %: 2.9 %
HCT: 40.4 % (ref 35.0–47.0)
HGB: 13.6 g/dL (ref 12.0–16.0)
LYMPHS PCT: 20.1 %
Lymphocyte #: 2.2 x10 3/mm (ref 1.0–3.6)
MCH: 31.9 pg (ref 26.0–34.0)
MCHC: 33.6 g/dL (ref 32.0–36.0)
MCV: 95 fL (ref 80–100)
MONOS PCT: 9 %
Monocyte #: 1 x10 3/mm — ABNORMAL HIGH (ref 0.2–0.9)
Neutrophil #: 7.1 x10 3/mm — ABNORMAL HIGH (ref 1.4–6.5)
Neutrophil %: 66 %
Platelet: 299 x10 3/mm (ref 150–440)
RBC: 4.26 10*6/uL (ref 3.80–5.20)
RDW: 13.1 % (ref 11.5–14.5)
WBC: 10.7 x10 3/mm (ref 3.6–11.0)

## 2014-05-30 LAB — COMPREHENSIVE METABOLIC PANEL
ALK PHOS: 146 U/L — AB
Albumin: 4 g/dL (ref 3.4–5.0)
Anion Gap: 9 (ref 7–16)
BUN: 13 mg/dL (ref 7–18)
Bilirubin,Total: 0.4 mg/dL (ref 0.2–1.0)
CREATININE: 1.11 mg/dL (ref 0.60–1.30)
Calcium, Total: 11.3 mg/dL — ABNORMAL HIGH (ref 8.5–10.1)
Chloride: 97 mmol/L — ABNORMAL LOW (ref 98–107)
Co2: 31 mmol/L (ref 21–32)
GFR CALC NON AF AMER: 55 — AB
Glucose: 281 mg/dL — ABNORMAL HIGH (ref 65–99)
OSMOLALITY: 284 (ref 275–301)
Potassium: 3.1 mmol/L — ABNORMAL LOW (ref 3.5–5.1)
SGOT(AST): 17 U/L (ref 15–37)
SGPT (ALT): 29 U/L
SODIUM: 137 mmol/L (ref 136–145)
Total Protein: 7.9 g/dL (ref 6.4–8.2)

## 2014-05-31 ENCOUNTER — Ambulatory Visit: Payer: Self-pay | Admitting: Oncology

## 2014-05-31 ENCOUNTER — Encounter: Payer: Self-pay | Admitting: Oncology

## 2014-06-11 LAB — COMPREHENSIVE METABOLIC PANEL
ALBUMIN: 3.8 g/dL (ref 3.4–5.0)
ALT: 38 U/L
ANION GAP: 7 (ref 7–16)
Alkaline Phosphatase: 141 U/L — ABNORMAL HIGH
BILIRUBIN TOTAL: 0.3 mg/dL (ref 0.2–1.0)
BUN: 10 mg/dL (ref 7–18)
Calcium, Total: 9.4 mg/dL (ref 8.5–10.1)
Chloride: 101 mmol/L (ref 98–107)
Co2: 27 mmol/L (ref 21–32)
Creatinine: 0.99 mg/dL (ref 0.60–1.30)
EGFR (Non-African Amer.): >60
GLUCOSE: 263 mg/dL — AB (ref 65–99)
OSMOLALITY: 278 (ref 275–301)
POTASSIUM: 3.4 mmol/L — AB (ref 3.5–5.1)
SGOT(AST): 18 U/L (ref 15–37)
SODIUM: 135 mmol/L — AB (ref 136–145)
TOTAL PROTEIN: 7.3 g/dL (ref 6.4–8.2)

## 2014-06-11 LAB — CREATININE, SERUM: CREATINE, SERUM: 0.99

## 2014-06-12 LAB — CANCER ANTIGEN 27.29: CA 27.29: 20.3 U/mL (ref 0.0–38.6)

## 2014-07-01 ENCOUNTER — Ambulatory Visit: Payer: Self-pay | Admitting: Oncology

## 2014-07-02 ENCOUNTER — Encounter: Payer: Self-pay | Admitting: General Surgery

## 2014-09-08 ENCOUNTER — Encounter: Payer: 59 | Attending: Family Medicine

## 2014-09-08 VITALS — Ht 62.5 in | Wt 158.8 lb

## 2014-09-08 DIAGNOSIS — Z713 Dietary counseling and surveillance: Secondary | ICD-10-CM | POA: Insufficient documentation

## 2014-09-08 DIAGNOSIS — E119 Type 2 diabetes mellitus without complications: Secondary | ICD-10-CM | POA: Diagnosis not present

## 2014-09-18 NOTE — Progress Notes (Signed)
Patient was seen on 09/08/14 for the complete diabetes self-management series at the [facility]. This is a part of the [facility] Program.  Handouts given during class include:  Living Well with Diabetes book  Carb Counting and Meal Planning book  Meal Plan Card  Carbohydrate guide  Meal planning worksheet  Low Sodium Flavoring Tips  The diabetes portion plate  Low Carbohydrate Snack Suggestions  A1c to eAG Conversion Chart  Diabetes Medications  Stress Management  Diabetes Recommended Care Schedule  Diabetes Success Plan  Core Class Satisfaction Survey  The following learning objectives were met by the patient during this course:  Describe diabetes  State some common risk factors for diabetes  Defines the role of glucose and insulin  Identifies type of diabetes and pathophysiology  Describe the relationship between diabetes and cardiovascular risk  State the members of the Healthcare Team  States the rationale for glucose monitoring  State when to test glucose  State their individual Target Range  State the importance of logging glucose readings  Describe how to interpret glucose readings  Identifies A1C target  Explain the correlation between A1c and eAG values  State symptoms and treatment of high blood glucose  State symptoms and treatment of low blood glucose  Explain proper technique for glucose testing  Identifies proper sharps disposal  Describe the role of different macronutrients on glucose  Explain how carbohydrates affect blood glucose  State what foods contain the most carbohydrates  Demonstrate carbohydrate counting  Demonstrate how to read Nutrition Facts food label  Describe effects of various fats on heart health  Describe the importance of good nutrition for health and healthy eating strategies  Describe techniques for managing your shopping, cooking and meal planning  List  strategies to follow meal plan when dining out  Describe the effects of alcohol on glucose and how to use it safely . State the amount of activity recommended for healthy living . Describe activities suitable for individual needs . Identify ways to regularly incorporate activity into daily life . Identify barriers to activity and ways to over come these barriers  Identify diabetes medications being personally used and their primary action for lowering glucose and possible side effects . Describe role of stress on blood glucose and develop strategies to address psychosocial issues . Identify diabetes complications and ways to prevent them  Explain how to manage diabetes during illness . Evaluate success in meeting personal goal . Establish 2-3 goals that they will plan to diligently work on until they return for the  4-month follow-up visit  Goals:   I will count my carb choices at most meals and snacks  I will be active 30 minutes or more 5 times a week  I will take my diabetes medications as scheduled  I will test my glucose at least 3 times a day, 7 days a week  I will look at patterns in my record book at least 5 days a month  To help manage stress I will  walk at least 2 times a week  Your patient has identified these potential barriers to change:  None noted  Your patient has identified their diabetes self-care support plan as  Family Support Link to Wellness program   Plan: Follow up with Link to Wellness Care Coordinator  

## 2014-09-24 ENCOUNTER — Ambulatory Visit: Payer: Self-pay | Admitting: Oncology

## 2014-09-24 ENCOUNTER — Encounter: Payer: Self-pay | Admitting: General Surgery

## 2014-09-24 ENCOUNTER — Ambulatory Visit (INDEPENDENT_AMBULATORY_CARE_PROVIDER_SITE_OTHER): Payer: 59 | Admitting: General Surgery

## 2014-09-24 VITALS — BP 120/70 | HR 76 | Resp 12 | Ht 62.5 in | Wt 158.0 lb

## 2014-09-24 DIAGNOSIS — Z85118 Personal history of other malignant neoplasm of bronchus and lung: Secondary | ICD-10-CM

## 2014-09-24 DIAGNOSIS — C50911 Malignant neoplasm of unspecified site of right female breast: Secondary | ICD-10-CM

## 2014-09-24 DIAGNOSIS — Z853 Personal history of malignant neoplasm of breast: Secondary | ICD-10-CM

## 2014-09-24 NOTE — Progress Notes (Signed)
Patient ID: Brittney Lam, female   DOB: [birth year], 54 y.o.   MRN: [id number]  Chief Complaint  Patient presents with  . Follow-up    6 month breast check    HPI Brittney Lam is a 54 y.o. female who presents for a 6 month breast check. No mammogram done at this time. She states she has had itching in the left breast for the last 2-3 months. She also states in the right side she has pain that started approximately 3 weeks ago. She also complains of a deep cough and wheezing that started approximately 3 weeks ago as well.   HPI  Past Medical History  Diagnosis Date  . Unspecified essential hypertension   . Kidney failure   . Personal history of tobacco use, presenting hazards to health   . Cataract   . Personal history of malignant neoplasm of breast   . Breast screening, unspecified   . Special screening for malignant neoplasms, colon   . Cancer 2001    left breast cancer s/p L/SN/R in 2001  . Cancer 2014    right breast invasive CA, Er pos,Pr neg, Her 2 neg.  . Lung cancer, upper lobe 2014    right upper lobe  . Diabetes mellitus without complication     Past Surgical History  Procedure Laterality Date  . Intercostal nerve block  2005  . Cataract extraction w/ intraocular lens implant  2005  . Carpal tunnel release Right 2011  . Insertion central venous access device w/ subcutaneous port  12-07-12  . Breast lumpectomy Left 2001  . Breast surgery Right 2014    total mastectomy  . Right lung upper lobectomy  03/2013    Family History  Problem Relation Age of Onset  . Diabetes Other   . Hyperlipidemia Other   . Hypertension Other     Social History History  Substance Use Topics  . Smoking status: Former Smoker -- 0.50 packs/day for 30 years  . Smokeless tobacco: Never Used  . Alcohol Use: No    Allergies  Allergen Reactions  . Metformin Diarrhea and Nausea Only  . No Known Allergies     Current Outpatient Prescriptions  Medication Sig Dispense Refill   . acetaminophen (TYLENOL) 325 MG tablet Take 650 mg by mouth every 6 (six) hours as needed for pain.    Marland Kitchen gabapentin (NEURONTIN) 300 MG capsule Take 300 mg by mouth as needed.    Marland Kitchen glipiZIDE (GLUCOTROL XL) 5 MG 24 hr tablet Take by mouth.    . hydrochlorothiazide (HYDRODIURIL) 25 MG tablet Take 25 mg by mouth daily.    Marland Kitchen letrozole (FEMARA) 2.5 MG tablet Take 2.5 mg by mouth daily.    . Multiple Vitamins-Minerals (MULTIVITAMIN PO) Take by mouth.    Marland Kitchen ofloxacin (OCUFLOX) 0.3 % ophthalmic solution Apply to eye.    Marland Kitchen PARoxetine Mesylate (BRISDELLE PO) Take 1 tablet by mouth at bedtime.    . simvastatin (ZOCOR) 10 MG tablet Take 10 mg by mouth daily.     No current facility-administered medications for this visit.    Review of Systems Review of Systems  Constitutional: Negative.   Respiratory: Positive for cough and wheezing.   Cardiovascular: Negative.     Blood pressure 120/70, pulse 76, resp. rate 12, height 5' 2.5" (1.588 m), weight 158 lb (71.668 kg).  Physical Exam Physical Exam  Constitutional: She is oriented to person, place, and time. She appears well-developed and well-nourished.  Eyes: Conjunctivae are normal. No  scleral icterus.  Neck: Neck supple. No thyromegaly present.  Cardiovascular: Normal rate, regular rhythm and normal heart sounds.   No murmur heard. Pulmonary/Chest: Effort normal and breath sounds normal. Left breast exhibits no inverted nipple, no mass, no nipple discharge, no skin change and no tenderness.  Right mastectomy site well healed.   Abdominal: Soft. Bowel sounds are normal. There is no hepatomegaly. There is no tenderness.  Lymphadenopathy:    She has no cervical adenopathy.    She has no axillary adenopathy.  Neurological: She is alert and oriented to person, place, and time.  Skin: Skin is warm and dry.    Data Reviewed Prior notes  Assessment    Pt with prior left breast cancer, recent right breast CA and right lung CA. Currently on  Letrazole. Exam is stable. Recent respiratory symptoms.      Plan    Pt has not had any recent imaging of chest.  Will discuss with Dr. Oliva Bustard regarding repeat CT chest        SANKAR,SEEPLAPUTHUR G. 09/26/2014, 6:14 AM

## 2014-09-24 NOTE — Patient Instructions (Signed)
Patient advised to follow up with Dr. Oliva Bustard. Patient to return to our office in 6 months for follow up with left breast mammogram. The patient is aware to call back for any questions or concerns.

## 2014-09-26 ENCOUNTER — Encounter: Payer: Self-pay | Admitting: General Surgery

## 2014-10-01 ENCOUNTER — Ambulatory Visit: Payer: Self-pay | Admitting: Oncology

## 2014-10-31 ENCOUNTER — Ambulatory Visit
Admit: 2014-10-31 | Disposition: A | Discharge status: Home / Self Care | Payer: Self-pay | Attending: Radiation Oncology | Admitting: Radiation Oncology

## 2014-11-26 ENCOUNTER — Other Ambulatory Visit: Payer: Self-pay

## 2014-11-26 NOTE — Patient Instructions (Signed)
-   call Dr. Kary Kos to schedule next visit for April 2016

## 2014-11-26 NOTE — Patient Outreach (Signed)
Brittney Lam comes to me with no complaints but after our discussion reveals Brittney Lam has toe and hand pain most nights when Brittney Lam goes to bed- it is related to chemo.  Brittney Lam cannot describe the pain but identifies it more as restless leg.  Brittney Lam also complains of ongoing low back pain as a result of chronically walking poorly, it has caused her to limp.  Brittney Lam is able to hear it go out of alignment but can do the exercises the physical therapist gave her and it will re-align.  Brittney Lam lives with her father who is a smoker and therefore Brittney Lam is exposed to second hand smoke- Brittney Lam is currently using Nicorette gum 2mg  tablets- uses 3/day.   Brittney Lam continues in the DPP program through [facility]- still writing her food down but still eating 80-90 grams of fat/day.  Brittney Lam eats what her father and husband bring into the house which is usually high fat- both her father and husband have diabetes and take insulin and are not willing to change their eating habits.   [place]        Patient Outreach from 11/26/2014 in [place] Problem One  Lacks preventative care   Care Plan for Problem One  Active   [facility] Long Term Goal (31-90 days)  will schedule a dilated eye exam byJune 2016 [- ask Dr. Kary Kos about whether you should be taking an aspirin daily]   [facility] Long Term Goal Start Date  11/26/14      [name], RN, [place], [place], [place]:  [phone]  Fax:  [phone] E-mail: [email or web address] [address], [place], [place]  [id number]

## 2014-11-30 ENCOUNTER — Ambulatory Visit
Admit: 2014-11-30 | Disposition: A | Discharge status: Home / Self Care | Payer: Self-pay | Attending: Radiation Oncology | Admitting: Radiation Oncology

## 2014-12-21 NOTE — Discharge Summary (Signed)
PATIENT NAME:  Brittney Lam, Brittney Lam MR#:  [id number] DATE OF BIRTH:  October 16, 1960  DATE OF ADMISSION:  04/26/2013 DATE OF DISCHARGE:  05/01/2013  HISTORY OF PRESENT ILLNESS: This is a 54 year old female who was brought in electively for right lung upper lobectomy. The patient had a history of left breast cancer treated with lumpectomy and radiation many years ago. Recently, she has been undergoing treatment for a very large ill-defined mass involving almost the entire right breast. This was also an invasive cancer and has been treated with 3 cycles of chemotherapy with significant reduction in the size of the right breast. In the course of initial evaluation, she was noted to have an upper lobe mass in the right lung. The patient was also a smoker. She subsequently, after completion of the 3 cycles of chemotherapy , underwent biopsy of the lung mass which proved to be of primary lung origin. In view of this, we have decided to proceed with a right lung upper lobectomy. Preoperatively, the patient underwent pulmonary function studies which showed an FEV1 of 86% with evidence of some mild restrictive component and not obstructive when the patient was placed on a NicoDerm patch and had stopped smoking for at least couple of weeks prior to the surgery. Pertinent other information included mild hypertension controlled with hydrochlorothiazide and some potassium supplement.   COURSE IN HOSPITAL: On 08/27, the patient underwent a right muscle-sparing thoracotomy with upper lobectomy. This was an uneventful procedure. Her postoperative course, likewise, was basically uncomplicated. She had no significant air leak after 48 hours and after a period of water seal, the chest tube was removed on 09/01.  The drain placed in the subcutaneous tissue was also removed prior to discharge. Chest x-ray at the time of discharge showed a very tiny apical pneumo, which had been stable over the last several days. The patient's incision was  healing satisfactorily. The patient was fairly comfortable with oral pain medication, and she was discharged in a stable condition to be followed as an outpatient.   FINAL DIAGNOSES: 1.  Adenocarcinoma of right lung upper lobe, lipidic type. 2.  History of breast cancer in the left breast.  3.  Ongoing treatment for breast cancer on the right breast.  4.  Mild hypertension.  5.  History of tobacco abuse.   OPERATION PERFORMED: Right thoracotomy and upper lobectomy.   ____________________________ S.Robinette Haines, MD sgs:dmm D: 05/08/2013 11:47:00 ET T: 05/08/2013 12:05:38 ET JOB#: [id number]  cc: S.G. Jamal Collin, MD, <Dictator> [facility] Robinette Haines MD ELECTRONICALLY SIGNED 05/08/2013 17:00

## 2014-12-21 NOTE — Op Note (Signed)
PATIENT NAME:  Brittney Lam, Brittney Lam MR#:  [id number] DATE OF BIRTH:  1961/05/03  DATE OF PROCEDURE:  07/25/2013  PREOPERATIVE DIAGNOSIS: Carcinoma of right breast.   POSTOPERATIVE DIAGNOSIS: Carcinoma of right breast.   PROCEDURE: Right total mastectomy and sentinel node biopsy.   SURGEON: Mckinley Jewel, M.D.   ANESTHESIA: General.   COMPLICATIONS: None.   ESTIMATED BLOOD LOSS: Approximately 50 mL.   DRAINS: Blake drain.   DESCRIPTION OF PROCEDURE: This patient underwent nuclear contrast injection preoperatively. She was brought to the operating room and put to sleep with a LMA. The right breast and axilla were then prepped and draped out as a sterile field. The patient had somewhat limited activity on a gamma finder in the axilla and therefore 5 mL of diluted methylene blue, 50%, was instilled in the subareolar region prior to prepping. A timeout procedure was performed. An elliptical incision was mapped out from the medial to the lateral aspect. A skin incision was then made over this marked area and attention was directed to the axillary portion. The superior flap was lifted up towards the axilla, at the anterior fold of the right axilla, from where the axillary fat pad was entered. With the gamma finder, there was noted to be some minimal activity over a small node and on further dissection it appeared that there were actually 3 nodes in this area. These were removed and sent off as sentinel node #1 and 2, possibly a third. The nodes were fairly small measuring a centimeter or less in size. None of them had the blue dye and 2 of them had some amount of signal activity outside of the axilla. There was also another node identified which had a little bit more of the blue dye present and also had some signal activity. This was sent off as sentinel node #3. There were no other visible or grossly palpable nodes and no other signal activity of concern was noted. Mastectomy was then completed. The skin and  superior flap was then developed all the way to the portion just below the clavicle and inferiorly the flap created down to the upper portion of the rectus fascia. From here the breast tissue was removed along with the fascia overlying the pectoral muscle, from the medial to the lateral aspect, and the lateral attachments were taken down. Throughout the procedure cautery was used to control bleeding. The wound was then irrigated and a Blake drain was positioned and brought out through a stab incision inferiorly and fastened to the skin with a nylon stitch. 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 All the other 3 nodes were negative on frozen section. The  case was discussed at this point with Dr. Oliva Bustard, her oncologist, and it was decided not to do a complete axillary dissection at this point. The wound was then closed. The subcutaneous tissue was approximated with interrupted sutures of 2-0 Vicryl, and the skin was closed with 2 running sutures of subcuticular 3-0 Vicryl. It was covered with Dermabond. A padded dressing was placed over this and a surgical bra was placed to hold this in place. The patient tolerated the procedure well with no immediate problems encountered, and she was subsequently extubated and returned to the recovery room in stable condition.   ____________________________ S.Robinette Haines, MD sgs:sb D: 07/25/2013 15:59:50 ET T: 07/25/2013 16:13:48 ET JOB#: [id number]  cc: S.G. Jamal Collin, MD, <Dictator> [facility] Robinette Haines MD ELECTRONICALLY SIGNED 07/26/2013 19:02

## 2014-12-21 NOTE — Consult Note (Signed)
Reason for Visit: This 54 year old Female patient presents to the clinic for initial evaluation of  breast cancer .   Referred by Dr. Oliva Bustard.  Diagnosis:  Chief Complaint/Diagnosis   54 year old female status post recent left breast in 2005 now with locally advanced stage IIIa (pyT3, N1, M0) invasive mammary carcinoma with mucin productionstatus post right modified radical mastectomy ER positive PR negative HER-2/neu negative now for adjuvant whole breast radiation after neoadjuvant chemotherapy  Pathology Report pathology report reviewed   Imaging Report mammograms, PET/CT and ultrasounds reviewed   Referral Report clinical notes reviewed   Planned Treatment Regimen postmastectomy chest wall radiation and peripheral lymphatics   HPI   patient is a 53 year old female well known to our Department having been treated back in 2005 status post lumpectomy of her left breast. No in July 2014 she had lung resection for a T1, N0, M0 adenocarcinoma status post right upper lobectomy. She presented with a largebreast mass self discoveredbiopsy positive for invasive mammary carcinoma. On evaluation by PET/CT scan there was also right upper lobe PET positive nodule noted and she underwent right upper lobectomy to rule out lung metastasis. Lung mass was positive for adenocarcinoma consistent with lung primary a T1 N0 lesion. She's also noted to have a large hypermetabolic mass in the right breast biopsy positive for invasive mammary carcinoma. She was started on neoadjuvant chemotherapy consisting Cytoxan Adriamycin followed by Taxol. She then underwent a right modified radical mastectomy. There was a 0.5 cm of residual tumor in the right breast. Margins were clear. 4 sentinel lymph nodes were examined one lymph node had a 2.2 mm metastatic focus. Tumor was overall grade 3. Tumor was strongly ER positive PR negative HER-2/neu not overexpressed. She has done well postoperatively. Case was presented at our weekly  tumor conference based on her poor prognostic factors of large primary breast mass and limited lymph node dissection with positive metastatic disease to sentinel node adjuvant radiation therapy to right chest wall and peripheral lymphatics was made. She seen today in doing well. Recovering well from her surgery and she is without complaint.  Past Hx:    Met Chest Wall:    Multi-drug Resistant Organism (MDRO): Positive culture for MRSA., 30-Apr-2013   Renal Insufficiency:    Breast Cancer: Right-2014 Left-2001   Hypertension:    H-Pylori:    Portacath Left: Apr 2014   Right Lobectomy: Aug 2014   Colonoscopy: 2012   Cataract Extraction:    Lumpectomy:   Past, Family and Social History:  Past Medical History positive   Cardiovascular hyperlipidemia; hypertension   Gastrointestinal GERD; H. pylori   Genitourinary renal insufficiency   Past Surgical History cataract surgery, right modified radical mastectomy, right upper lobectomy   Past Medical History Comments hemorrhoid surgery   Family History positive   Family History Comments her family history of cancer family history of adult onset diabetes.   Social History positive   Social History Comments 15 PACback your smoking history no EtOH abuse history   Additional Past Medical and Surgical History seen by herself today   Allergies:   Other -Explain in Comment Field: Itching  Home Meds:  Home Medications: Medication Instructions Status  multivitamin 1 tab(s) orally once a day Active  hydrochlorothiazide 12.5 mg oral tablet 1 tab(s) orally once a day Active  Aleve sodium 220 mg oral tablet 1 tab(s) orally every 8 hours, As Needed Active  Tylox 5/334m 1 tab(s) orally every 4 hours, As Needed - for Pain Active  Review of Systems:  General negative   Performance Status (ECOG) 0   Skin negative   Breast see HPI   Ophthalmologic negative   ENMT negative   Respiratory and Thorax see HPI    Cardiovascular negative   Gastrointestinal negative   Genitourinary negative   Musculoskeletal negative   Neurological negative   Psychiatric negative   Hematology/Lymphatics negative   Endocrine negative   Allergic/Immunologic negative   Review of Systems   review of systems obtained from nurse's notes  Nursing Notes:  Nursing Vital Signs and Chemo Nursing Nursing Notes: *CC Vital Signs Flowsheet:   10-Dec-14 13:47  Pulse Pulse 74  Respirations Respirations 20  SBP SBP 118  DBP DBP 71  Pain Scale (0-10)  0  Current Weight (kg) (kg) 67  Height (cm) centimeters 157.5  BSA (m2) 1.6   Physical Exam:  General/Skin/HEENT:  General normal   Eyes normal   ENMT normal   Head and Neck normal   Additional PE well-developed well-nourished female in NAD. Lungs are clear to A&P cardiac examination shows regular rate and rhythm. Left breast shows fibrotic changes consistent with prior radiation no dominant mass or nodularity is noted in the left breast into position examined. No axillary or supraclavicular adenopathy is appreciated. She status post them a right modified radical mastectomy. Chest walls healing well no skin lesions nodularity is identified. No lymphedema of the right upper extremity is noted.she has a Port-A-Cath placed in her left upper chest wall   Breasts/Resp/CV/GI/GU:  Respiratory and Thorax normal   Cardiovascular normal   Gastrointestinal normal   Genitourinary normal   MS/Neuro/Psych/Lymph:  Musculoskeletal normal   Neurological normal   Lymphatics normal   Other Results:  Radiology Results: LabUnknown:    07-Apr-14 14:23, PET/CT Scan Breast CA Stage/Restaging  PACS Image     07-Jul-14 13:16, CT Chest With Contrast  PACS Image   CT:  CT Chest With Contrast   REASON FOR EXAM:    restaging breast  Ca  chest mass  COMMENTS:       PROCEDURE: KCT - KCT CHEST WITH CONTRAST  - Mar 06 2013  1:16PM     RESULT:     Technique:  Helical 3  mm sections were obtained from the thoracic inlet   through the lung bases status post intravenous administration of 75 ml of   Isovue-370.    Findings:  The mediastinum and hilar regions and structures demonstrate   subcentimeter lymph nodes within the paratracheal and prevascular   regions. There is no evidence of mediastinalmasses or pathologic sized   adenopathy. The lung parenchyma demonstrates a 1.9 x 2.38 cm mass in a     subpleural location along the anterior aspect of the apical segment of   the right upper lobe. The measurements were obtained on image #20 of the   lung series. There is a spiculated component of this soft tissue mass and   in the appropriate clinical setting this finding is consistent with   neoplastic disease until proven otherwise. Ill-defined areas of increased   subpleural density project within the anterior aspect of the lingula,   within the posterior aspect of the right lower lobe and within the   paraspinous region of the apex of the right lower lobe. These findings   are appreciated on image #46, 43 and 39 respectively. The visualized   upper abdominal viscera demonstrate no gross abnormalities.    IMPRESSION:     1.  Findings  concerning for a right upper lobe mass until proven   otherwise particularly in the appropriate clinical setting.  2.  Ill-defined subpleural densities as described above.  3.  Not mentioned above, there appear to be post surgical changes within   the left breast.      Thank you for this opportunity to contribute to the care of your patient.         Verified By: Mikki Santee, M.D., MD  [facility]:    26-Mar-14 08:44, Digital Diagnostic Mammogram Bilateral  Digital Diagnostic Mammogram Bilateral   REASON FOR EXAM:    RT BRST MASS AND HX BRST CA YRLY  COMMENTS:       PROCEDURE: MAM - MAM DGTL DIAGNOSTIC MAMMO W/CAD  - Nov 23 2012  8:44AM     RESULT:  Comparison made to multiple prior studies dating to 10/03/2003.    Changes of scarring noted of the left breast consistent patient's prior   history of breast surgery and radiation. There today's exam there is new   onset of thickening of the left breast. This could be a sign of   malignancy. This could be secondary lymphedema. Skin thickening on the   left is stable. Changes of scarring also present in the right breast.   Benign calcifications noted both breast.    IMPRESSION:    1. New onset of skin thickening right breast. This could be a sign of     malignancy. Lymphedema could present this fashion.  2. Bilateral breast scarring.    BI-RADS: Category 4 - Suspicious Abnormality - Biopsy Should Be Considered      Thank you for the oppurtunity to contribute to the care of your patient. Marland Kitchen    BREAST COMPOSITION: The breast composition is EXTREMELY DENSE (glandular   tissue greater than 75%) This decreases the sensitivity of mammography.      Addendum: In the body of the report above it should read there is new   onset thickening of the right breast. Soft tissue prominence in the   region of the scar in the right breast may be slightly more prominent on     today's exam as well. Findings discussed with Dr. Jamal Collin.        Verified By: Osa Craver, M.D., MD  [facility]:    07-Apr-14 14:23, PET/CT Scan Breast CA Stage/Restaging  PET/CT Scan Breast CA Stage/Restaging   REASON FOR EXAM:    Breast cancer staging  COMMENTS:       PROCEDURE: PET - PET/CT RESTG BREAST CA  - Dec 05 2012  2:23PM     RESULT: History: Breast cancer.    Comparison Study: No recent.    Findings: Following determination of fasting blood sugar of 89 mg/dL 13.3   mCi of F-18 FDG administered. CT is obtained for attenuation correction   and fusion. Right upper lung a 2.2 cm mass is present. The mass is   intensely PET positive with a SUV of a 9.5. No other PET positive   abnormalities identified.  IMPRESSION:  Intensely PET positive right pulmonary apex  mass.        Verified By: Osa Craver, M.D., MD   Relevent Results:   Relevant Scans and Labs PET/CT CT scans mammograms reviewed   Assessment and Plan: Impression:   stage IIIa invasive mammary carcinoma the right breast status post neoadjuvant chemotherapy and right modified radical mastectomy for 8 cm lesion with one of 4 sentinel lymph nodes  positive from metastatic disease. Tumor is ER positive PR negative HER-2/neu not overexpressed Plan:   case was presented to her weekly tumor conference. Based on her poor prognostic factors of large primary tumor size fairly unresponsive to neoadjuvant chemotherapy, one of 4 sentinel lymph nodes involved and not being a axillary dissection greater than 10 lymph nodes I believe she would benefit from adjuvant radiation therapy to her right chest wall and peripheral lymphatics. Would plan on delivering 5000 sensory to both areas. Would boost or scar another 1400 cGy using electron beam. Risks and benefits of treatment including skin reaction, fatigue, alteration of blood counts, and possibility of lymphedema in her right upper extremity were all explained in detail to the patient. I've also expressed the need to do exercise with a right upper extremity to try to avoid lymphedema over time. I have set her up for CT simulation next week.  I would like to take this opportunity to thank you for allowing me to continue to participate in this patient's care.  CC Referral:  cc: Dr. Jamal Collin, Dr. [name]   Electronic Signatures: Baruch Gouty, Roda Shutters (MD)  (Signed 10-Dec-14 14:29)  Authored: HPI, Diagnosis, Past Hx, PFSH, Allergies, Home Meds, ROS, Nursing Notes, Physical Exam, Other Results, Relevent Results, Encounter Assessment and Plan, CC Referring Physician   Last Updated: 10-Dec-14 14:29 by Armstead Peaks (MD)

## 2014-12-21 NOTE — Op Note (Signed)
PATIENT NAME:  Brittney Lam, Brittney Lam MR#:  [id number] DATE OF BIRTH:  [birth year]  DATE OF PROCEDURE:  04/26/2013  PREOPERATIVE DIAGNOSES:  1.  Carcinoma of the lung, right upper lobe.  2.  Carcinoma of the breast.   POSTOPERATIVE DIAGNOSES:  1.  Carcinoma of the lung, right upper lobe.  2.  Carcinoma of the breast.   OPERATION: Right thoracotomy and upper lobe lung lobectomy.   SURGEON: S.G. Jamal Collin, M.D.   ANESTHESIA: General.   COMPLICATIONS: None.   ESTIMATED BLOOD LOSS: Approximately 50 mL.   DRAINS: A 32-French chest tube and a #15-Blake drain.   COMPLICATIONS: None.   DESCRIPTION OF PROCEDURE: The patient had an epidural catheter placed preoperatively. She was brought to the operating room and placed and put to sleep in the supine position on the operating table. A double lumen tube was utilized and bronchoscopy was performed with confirmation of good positioning of the double lumen tube with the right and left bronchus and their branches being free of any abnormality. Following this, the patient was positioned in the left lateral position with attention paid to all pressure points and use of an axillary roll. A Foley catheter was inserted also preoperatively. The right chest was then prepped and draped out as a sterile field. A timeout procedure was performed.   A 6-inch incision was made in the lateral aspect extending from just below the inferior angle of the scapula anteriorly. The incision was then deepened through the subcutaneous tissue of the fascia down to the muscular layer. The skin and subcutaneous tissue overlying and the fascia overlying the muscle were elevated as flaps on both sides and in front to back as far as this could be reached. The anterior edge of the latissimus dorsi muscle was then opened and with blunt dissection and  cautery, was retracted posteriorly. The serratus anterior muscle was then identified and it was then reflected anteriorly, exposing the ribs.    After this was done, along the uppermost rib that was easily accessible, the chest cavity was entered into along the superior border of the rib and extended as far out as possible to allow for placement of a rib retractor which was then used to gently separate the rib space. A second self-retaining retractor was placed to hold the muscles in place and an adequate exposure of the right chest cavity was obtained.   The patient had no evidence of the spread of the cancer that was visible. She had a 3 cm upper lobe mass with no evidence of a pleural involvement. An elongated 1.5 x 0.5 cm node was noted on the inferior portion of the esophagus. This was removed and sent off separately. The inferior pulmonary ligament was then taken down.   Further inspection showed that the patient in fact had no other findings of concern. The upper lobe was satisfactorily exposed. The fissure between the upper lobe and the lower lobe and the middle lobe was noted to be completely separate. The dissection was then performed initially to identify the pulmonary vein supplying the upper lobe. With careful dissection, this was freed and a vessel loop was passed around it. Two small branches connecting to this vein were taken down with ligatures of 2-0 silk and Hemoclips placed. The vascular staple Echelon-powered instrument was used to take down the vein without any difficulty. This was done through an anterolateral incision placed inferiorly in the chest.   The subsequent dissection was for the pulmonary artery, which was then freed  and a branch of this was separately ligated with 2-0 silk and hemoclipped and cut. After the artery was freed, it was similarly stapled across with the Echelon vascular staple load.   After this was done, the bronchus of the upper lobe was noted to be free and after this was satisfactorily cleaned off, a green load of the Echelon stapling device was used to take the bronchus down. All staple lines  were noted to be intact. The upper lobe was removed and sent to pathology in formalin. A single hilar node was identified which was grossly normal in appearance. This was separately removed and sent as a hilar node. No other lymphadenopathy was noted.   After ensuring hemostasis, the chest was filled with some saline and the right lung allowed to expand and no air leak was identified. The fluid was suctioned out. The area of the staple lines was covered with 5 mL of ProGEL. The chest tube 32-French was used.   A small incision was made below the incision that was made for the passage of stapler and tunneled underneath the musculature and the chest tube positioned going into the chest cavity and placed going up towards the apex. The muscular tissue in the incision by the the chest tube site was closed with 2-0 Vicryl and the skin with subcuticular 4-0 Vicryl. The chest tube was anchored to the skin with 2 stitches of 0 nylon. The retractors were then removed and the break in the table was taken down. The ribs were then approximated with interrupted stitches of #2 Vicryl. The muscles were placed back in their position. The subcutaneous pocket was then drained with a Blake drain and brought out through a small stab incision anteriorly and fixed to the skin with nylon stitch. The Scarpa fascia in the main wound was closed with a running suture of 2-0 Vicryl. Skin was approximated with subcuticular stitch of 4-0 Vicryl. The skin incisions were reinforced with Steri-Strips and tincture of benzoin, and dry sterile dressings were placed. The chest tube was connected to Pleur-evac and the [facility] drain chamber was attached. The patient subsequently was extubated and returned to the recovery room in stable condition.    ____________________________ S.Robinette Haines, MD sgs:np D: 04/26/2013 18:12:16 ET T: 04/26/2013 20:20:50 ET JOB#: [id number]  cc: Synthia Innocent. Jamal Collin, MD, <Dictator> [facility] Robinette Haines MD ELECTRONICALLY SIGNED  04/30/2013 10:31

## 2014-12-21 NOTE — Op Note (Signed)
PATIENT NAME:  Brittney Lam, Brittney Lam MR#:  [id number] DATE OF BIRTH:  September 11, 1960  DATE OF PROCEDURE:  12/07/2012  PREOPERATIVE DIAGNOSIS: Carcinoma of the breast.   POSTOPERATIVE DIAGNOSIS: Carcinoma of the breast.   OPERATION: Insertion of venous access port with ultrasound and fluoroscopic guidance.   SURGEON: S.G. Jamal Collin, M.D.   ANESTHESIA: Local with a mixture of 0.5% Marcaine and 1% Xylocaine.   COMPLICATIONS: None.   ESTIMATED BLOOD LOSS: Less than 10 mL.   DRAINS: None.   DESCRIPTION OF PROCEDURE: The patient was placed in the supine position on the operating table. The left upper chest and neck area were prepped and draped out as a sterile field. After a time out was performed, ultrasound probe was brought up to the field and the subclavian vein was identified beneath the lateral end of the clavicle on the left. Local anesthetic was instilled and a small 1 cm skin incision was made and through this a needle was successfully positioned in the subclavian vein with free withdrawal of blood. Using a Seldinger technique, a guidewire followed by introducer and dilator and subsequent placement of the catheter was achieved with the catheter position going into the superior vena cava with fluoroscopic guidance. After removal of the outer sheath, a subcutaneous pocket was created over the second costal cartilage. This area was infiltrated with local anesthetic and an incision was made transversely. Subcutaneous pocket was then made and the catheter was tunneled through to this site. The catheter was then cut to approximate length and fixed to a prefilled port. The port was placed in the pocket and anchored to the underlying fascia with 3 stitches of  2-0 Prolene. The port was then flushed through with 6 mL of heparinized saline. Fluoroscopy was repeated to ensure that the catheter was lying without any kinks and still in proper position in the superior vena cava. The wounds were then closed. Subcutaneous  tissue was closed with 3-0 Vicryl and the skin with subcuticular 4-0 Vicryl covered with Dermabond. The procedure was well tolerated. She was subsequently returned to the recovery room in stable condition.  ____________________________ S.Robinette Haines, MD sgs:aw D: 12/08/2012 08:18:18 ET T: 12/08/2012 08:41:22 ET JOB#: [id number]  cc: Synthia Innocent. Jamal Collin, MD, <Dictator> [facility] Robinette Haines MD ELECTRONICALLY SIGNED 12/13/2012 10:44

## 2014-12-24 DIAGNOSIS — IMO0002 Reserved for concepts with insufficient information to code with codable children: Secondary | ICD-10-CM | POA: Insufficient documentation

## 2014-12-24 DIAGNOSIS — Z1635 Resistance to multiple antimicrobial drugs: Secondary | ICD-10-CM

## 2014-12-24 DIAGNOSIS — K2980 Duodenitis without bleeding: Secondary | ICD-10-CM

## 2014-12-24 DIAGNOSIS — B9681 Helicobacter pylori [H. pylori] as the cause of diseases classified elsewhere: Secondary | ICD-10-CM | POA: Insufficient documentation

## 2015-01-18 ENCOUNTER — Other Ambulatory Visit: Payer: Self-pay | Admitting: *Deleted

## 2015-01-18 DIAGNOSIS — C50919 Malignant neoplasm of unspecified site of unspecified female breast: Secondary | ICD-10-CM

## 2015-01-21 ENCOUNTER — Inpatient Hospital Stay: Payer: 59 | Attending: Oncology

## 2015-01-21 ENCOUNTER — Inpatient Hospital Stay (HOSPITAL_BASED_OUTPATIENT_CLINIC_OR_DEPARTMENT_OTHER): Payer: 59 | Admitting: Oncology

## 2015-01-21 VITALS — BP 130/91 | HR 103 | Temp 96.7°F | Wt 150.4 lb

## 2015-01-21 DIAGNOSIS — Z85118 Personal history of other malignant neoplasm of bronchus and lung: Secondary | ICD-10-CM | POA: Diagnosis not present

## 2015-01-21 DIAGNOSIS — E119 Type 2 diabetes mellitus without complications: Secondary | ICD-10-CM | POA: Diagnosis not present

## 2015-01-21 DIAGNOSIS — Z170 Estrogen receptor positive status [ER+]: Secondary | ICD-10-CM | POA: Insufficient documentation

## 2015-01-21 DIAGNOSIS — C3490 Malignant neoplasm of unspecified part of unspecified bronchus or lung: Secondary | ICD-10-CM

## 2015-01-21 DIAGNOSIS — Z79899 Other long term (current) drug therapy: Secondary | ICD-10-CM | POA: Diagnosis not present

## 2015-01-21 DIAGNOSIS — Z79811 Long term (current) use of aromatase inhibitors: Secondary | ICD-10-CM | POA: Insufficient documentation

## 2015-01-21 DIAGNOSIS — Z87891 Personal history of nicotine dependence: Secondary | ICD-10-CM | POA: Diagnosis not present

## 2015-01-21 DIAGNOSIS — C50919 Malignant neoplasm of unspecified site of unspecified female breast: Secondary | ICD-10-CM

## 2015-01-21 DIAGNOSIS — C50911 Malignant neoplasm of unspecified site of right female breast: Secondary | ICD-10-CM | POA: Diagnosis not present

## 2015-01-21 DIAGNOSIS — R918 Other nonspecific abnormal finding of lung field: Secondary | ICD-10-CM

## 2015-01-21 DIAGNOSIS — I10 Essential (primary) hypertension: Secondary | ICD-10-CM | POA: Insufficient documentation

## 2015-01-21 DIAGNOSIS — C3410 Malignant neoplasm of upper lobe, unspecified bronchus or lung: Secondary | ICD-10-CM | POA: Insufficient documentation

## 2015-01-21 LAB — CBC WITH DIFFERENTIAL/PLATELET
Basophils Absolute: 0.1 10*3/uL (ref 0–0.1)
Basophils Relative: 1 %
EOS ABS: 0.4 10*3/uL (ref 0–0.7)
EOS PCT: 3 %
HCT: 37.4 % (ref 35.0–47.0)
HEMOGLOBIN: 12.5 g/dL (ref 12.0–16.0)
LYMPHS PCT: 16 %
Lymphs Abs: 2.1 10*3/uL (ref 1.0–3.6)
MCH: 30.9 pg (ref 26.0–34.0)
MCHC: 33.5 g/dL (ref 32.0–36.0)
MCV: 92.3 fL (ref 80.0–100.0)
MONO ABS: 1.2 10*3/uL — AB (ref 0.2–0.9)
Monocytes Relative: 9 %
NEUTROS PCT: 71 %
Neutro Abs: 9.1 10*3/uL — ABNORMAL HIGH (ref 1.4–6.5)
PLATELETS: 384 10*3/uL (ref 150–440)
RBC: 4.05 MIL/uL (ref 3.80–5.20)
RDW: 13.7 % (ref 11.5–14.5)
WBC: 12.9 10*3/uL — ABNORMAL HIGH (ref 3.6–11.0)

## 2015-01-21 LAB — COMPREHENSIVE METABOLIC PANEL
ALT: 24 U/L (ref 14–54)
AST: 25 U/L (ref 15–41)
Albumin: 4.2 g/dL (ref 3.5–5.0)
Alkaline Phosphatase: 144 U/L — ABNORMAL HIGH (ref 38–126)
Anion gap: 9 (ref 5–15)
BUN: 9 mg/dL (ref 6–20)
CALCIUM: 9.3 mg/dL (ref 8.9–10.3)
CO2: 27 mmol/L (ref 22–32)
Chloride: 97 mmol/L — ABNORMAL LOW (ref 101–111)
Creatinine, Ser: 0.78 mg/dL (ref 0.44–1.00)
GFR calc Af Amer: >60 mL/min (ref >60)
GFR calc non Af Amer: >60 mL/min (ref >60)
GLUCOSE: 86 mg/dL (ref 65–99)
POTASSIUM: 3.8 mmol/L (ref 3.5–5.1)
Sodium: 133 mmol/L — ABNORMAL LOW (ref 135–145)
TOTAL PROTEIN: 9 g/dL — AB (ref 6.5–8.1)
Total Bilirubin: 0.5 mg/dL (ref 0.3–1.2)

## 2015-01-21 NOTE — Progress Notes (Signed)
Approximately 6 weeks ago patient developed cough.  Also started to have pain in bilateral shoulder blades and both breasts.  States this has not improved since inception.

## 2015-01-22 ENCOUNTER — Other Ambulatory Visit: Payer: Self-pay

## 2015-01-22 NOTE — Patient Outreach (Signed)
[facility]) Care Management  01/22/2015  Brittney Lam [birth year] [id number]   Patient received my phone call regarding this appointment.  She was having a dilated eye exam and it had run over causing her to miss this appointment- she cam to the appointment late.  Oncology visits are routine visits but she has developed a cough (x 3 months) so the MD is doing a CT scan later this week.  The porta cath will be removed if the CT scan is clear.  Recent A1C with MD 7.2%- went from eating 120gm fat/ day to just 60 gm/day and blood sugars have improved.  Cannot get to the 30gm of fat per day that is recommended.  Exercising 180 minutes per week. Has had 1 episode of low BG at '65mg'$ /dl but does feel shakey below '120mg'$ /dl often as well- she does not treat the BG of '120mg'$ /dl but did treat the blood sugar of '65mg'$ /dl with a full mountain Dew and a peanut butter and jelly sandwich- reviewed the appropriate treatment for low blood sugars.  Patient does know the symptoms of heart attack and stroke.  I will f/u with her in 1 month.   [name], RN, BA, [place], [place] Direct Dial:  [phone]  Fax:  [phone] E-mail: [email or web address] [address], [place], [place]  [address]

## 2015-01-22 NOTE — Patient Outreach (Signed)
[facility]) Care Management  01/22/2015  Brittney Lam 06/13/1961 [id number]   Patient did not show for scheduled visit today- left a message to call me.   [name], RN, BA, [place], [place] Direct Dial:  [phone]  Fax:  [phone] E-mail: [email or web address] [address] [place], [place]  [address]

## 2015-01-22 NOTE — Patient Outreach (Signed)
[facility]) Care Management  01/22/2015  EVYN KOOYMAN 13-Sep-1960 [id number]   Patient did not show for her scheduled visit today.  Noted in the chart, numerous appointments in the oncology department this week.  Left message to have [place] update me.   [name], RN, BA, [place], [place] Direct Dial:  [phone]  Fax:  [phone] E-mail: [email or web address] [address] [place], [place]  [address]

## 2015-01-22 NOTE — Addendum Note (Signed)
Addended by: [name] on: 01/22/2015 11:36 AM   Modules accepted: Medications

## 2015-01-23 ENCOUNTER — Ambulatory Visit: Payer: 59

## 2015-01-24 ENCOUNTER — Ambulatory Visit
Admission: RE | Admit: 2015-01-24 | Discharge: 2015-01-24 | Disposition: A | Discharge status: Home / Self Care | Payer: 59 | Source: Ambulatory Visit | Attending: Oncology | Admitting: Oncology

## 2015-01-24 DIAGNOSIS — C3490 Malignant neoplasm of unspecified part of unspecified bronchus or lung: Secondary | ICD-10-CM | POA: Insufficient documentation

## 2015-01-24 MED ORDER — IOHEXOL 300 MG/ML  SOLN
75.0000 mL | Freq: Once | INTRAMUSCULAR | Status: AC | PRN
  Administered 2015-01-24: 75 mL via INTRAVENOUS

## 2015-01-26 ENCOUNTER — Encounter: Payer: Self-pay | Admitting: Oncology

## 2015-01-26 NOTE — Progress Notes (Signed)
[place] @ [facility] Telephone:(336) [phone]  Fax:([phone]) Fisher: 1961-07-10  MR#: [id number]  JKK#:[id number]  Patient Care Team: [name], MD as PCP - General (Family Medicine) Seeplaputhur Robinette Haines, MD (General Surgery)  CHIEF COMPLAINT:  Chief Complaint  Patient presents with  . Follow-up    Oncology History   26. 54 year old female status post recent left breast cancer in 2005  2. Right breast cancer, locally advanced stage IIIa (pyT3, N1, M0), invasive mammary carcinoma with mucin production. Status post right modified radical mastectomy for 8 cm lesion with one of 4 sentinel lymph nodes positive for metastatic disease. Tumor is ER positive PR negative HER-2/neu not overexpressed. 3. Completed neoadjuvant chemotherapy with chest wall radiation therapy,  Finished on October 21, 2013. 4. Started letrazole 2.5 mg by mouth daily from October 24, 2013. 5. Carcinoma of lung.  Right upper lobe, status post right upper lobectomy, [id number].     Malignant neoplasm of female breast   04/03/2013 Initial Diagnosis Malignant neoplasm of female breast    No flowsheet data found.  INTERVAL HISTORY: 54 year old lady with a history of carcinoma of breast status post right breast mastectomy in history of carcinoma of lung came today further follow-up.  Patient had some bony pain secondary to letrozole therapy but gradually recovering.  No bony pain.  Appetite has been stable.  This and does not smoke.  No cough hemoptysis or chest pain REVIEW OF SYSTEMS:   GENERAL:  Feels good.  Active.  No fevers, sweats or weight loss. PERFORMANCE STATUS (ECOG):0 HEENT:  No visual changes, runny nose, sore throat, mouth sores or tenderness. Lungs: No shortness of breath or cough.  No hemoptysis. Cardiac:  No chest pain, palpitations, orthopnea, or PND. GI:  No nausea, vomiting, diarrhea, constipation, melena or hematochezia. GU:  No urgency, frequency, dysuria, or  hematuria. Musculoskeletal:  No back pain.  No joint pain.  No muscle tenderness. Extremities:  No pain or swelling. Skin:  No rashes or skin changes. Neuro:  No headache, numbness or weakness, balance or coordination issues. Endocrine:  No diabetes, thyroid issues, hot flashes or night sweats. Psych:  No mood changes, depression or anxiety. Pain:  No focal pain. Review of systems:  All other systems reviewed and found to be negative.  As per HPI. Otherwise, a complete review of systems is negatve.  PAST MEDICAL HISTORY: Past Medical History  Diagnosis Date  . Unspecified essential hypertension   . Kidney failure   . Personal history of tobacco use, presenting hazards to health   . Cataract   . Personal history of malignant neoplasm of breast   . Breast screening, unspecified   . Special screening for malignant neoplasms, colon   . Cancer 2001    left breast cancer s/p L/SN/R in 2001  . Cancer 2014    right breast invasive CA, Er pos,Pr neg, Her 2 neg.  . Lung cancer, upper lobe 2014    right upper lobe  . Diabetes mellitus without complication   . MDRO (multiple drug resistant organisms) resistance   . Breast cancer   . H. pylori duodenitis     PAST SURGICAL HISTORY: Past Surgical History  Procedure Laterality Date  . Intercostal nerve block  2005  . Cataract extraction w/ intraocular lens implant  2005  . Carpal tunnel release Right 2011  . Insertion central venous access device w/ subcutaneous port  12-07-12  . Breast lumpectomy Left 2001  . Breast surgery  Right 2014    total mastectomy  . Right lung upper lobectomy  03/2013  . Lobectomy Right   . Cataract extraction      FAMILY HISTORY Family History  Problem Relation Age of Onset  . Diabetes Other   . Hyperlipidemia Other   . Hypertension Other     GYNECOLOGIC HISTORY:  No LMP recorded. Patient is postmenopausal.     ADVANCED DIRECTIVES: Patient does not have any advanced healthcare  directive. Information has been given.   HEALTH MAINTENANCE: History  Substance Use Topics  . Smoking status: Former Smoker -- 0.50 packs/day for 30 years    Types: Cigarettes  . Smokeless tobacco: Never Used     Comment: using 55m nicorette gum- 1/2 piece, 10 times per day  . Alcohol Use: No      Allergies  Allergen Reactions  . Metformin Diarrhea and Nausea Only  . Other Rash    Sage Wipes    Current Outpatient Prescriptions  Medication Sig Dispense Refill  . acetaminophen (TYLENOL) 325 MG tablet Take 650 mg by mouth every 6 (six) hours as needed for pain.    .Marland KitchenglipiZIDE (GLUCOTROL XL) 5 MG 24 hr tablet Take by mouth.    . hydrochlorothiazide (HYDRODIURIL) 25 MG tablet Take 25 mg by mouth daily.    .Marland Kitchenletrozole (FEMARA) 2.5 MG tablet Take 2.5 mg by mouth daily.    . Multiple Vitamins-Minerals (MULTIVITAMIN PO) Take by mouth.    . simvastatin (ZOCOR) 10 MG tablet Take 10 mg by mouth daily.    .Marland Kitchengabapentin (NEURONTIN) 300 MG capsule Take 300 mg by mouth as needed.    . hydrochlorothiazide (HYDRODIURIL) 25 MG tablet Take 25 mg by mouth daily. Take half daily    . naproxen sodium (ANAPROX) 220 MG tablet Take 220 mg by mouth every 8 (eight) hours as needed.    . nicotine polacrilex (NICORETTE) 2 MG gum Take 2 mg by mouth 5 (five) times daily.     .Marland KitchenPARoxetine Mesylate (BRISDELLE PO) Take 1 tablet by mouth at bedtime.     No current facility-administered medications for this visit.    OBJECTIVE:  Filed Vitals:   01/21/15 1523  BP: 130/91  Pulse: 103  Temp: 96.7 F (35.9 C)     Body mass index is 27.49 kg/(m^2).    ECOG FS:0 - Asymptomatic  PHYSICAL EXAM: GENERAL:  Well developed, well nourished, sitting comfortably in the exam room in no acute distress. MENTAL STATUS:  Alert and oriented to person, place and time. HEAD:   Normocephalic, atraumatic, face symmetric, no Cushingoid features. EYES:    Pupils equal round and reactive to light and accomodation.  No  conjunctivitis or scleral icterus. ENT:  Oropharynx clear without lesion.  Tongue normal. Mucous membranes moist.  RESPIRATORY:  Clear to auscultation without rales, wheezes or rhonchi. CARDIOVASCULAR:  Regular rate and rhythm without murmur, rub or gallop. BREAST:  Right breast : Status post mastectomy.  No evidence of recurrent disease skin changes or nipple discharge.  Left breast without masses, skin changes or nipple discharge. ABDOMEN:  Soft, non-tender, with active bowel sounds, and no hepatosplenomegaly.  No masses. BACK:  No CVA tenderness.  No tenderness on percussion of the back or rib cage. SKIN:  No rashes, ulcers or lesions. EXTREMITIES: No edema, no skin discoloration or tenderness.  No palpable cords. LYMPH NODES: No palpable cervical, supraclavicular, axillary or inguinal adenopathy  NEUROLOGICAL: Unremarkable. PSYCH:  Appropriate.   LAB RESULTS:  Appointment on  01/21/2015  Component Date Value Ref Range Status  . WBC 01/21/2015 12.9* 3.6 - 11.0 K/uL Final  . RBC 01/21/2015 4.05  3.80 - 5.20 MIL/uL Final  . Hemoglobin 01/21/2015 12.5  12.0 - 16.0 g/dL Final  . HCT 01/21/2015 37.4  35.0 - 47.0 % Final  . MCV 01/21/2015 92.3  80.0 - 100.0 fL Final  . MCH 01/21/2015 30.9  26.0 - 34.0 pg Final  . MCHC 01/21/2015 33.5  32.0 - 36.0 g/dL Final  . RDW 01/21/2015 13.7  11.5 - 14.5 % Final  . Platelets 01/21/2015 384  150 - 440 K/uL Final  . Neutrophils Relative % 01/21/2015 71   Final  . Neutro Abs 01/21/2015 9.1* 1.4 - 6.5 K/uL Final  . Lymphocytes Relative 01/21/2015 16   Final  . Lymphs Abs 01/21/2015 2.1  1.0 - 3.6 K/uL Final  . Monocytes Relative 01/21/2015 9   Final  . Monocytes Absolute 01/21/2015 1.2* 0.2 - 0.9 K/uL Final  . Eosinophils Relative 01/21/2015 3   Final  . Eosinophils Absolute 01/21/2015 0.4  0 - 0.7 K/uL Final  . Basophils Relative 01/21/2015 1   Final  . Basophils Absolute 01/21/2015 0.1  0 - 0.1 K/uL Final  . Sodium 01/21/2015 133* 135 - 145 mmol/L  Final  . Potassium 01/21/2015 3.8  3.5 - 5.1 mmol/L Final  . Chloride 01/21/2015 97* 101 - 111 mmol/L Final  . CO2 01/21/2015 27  22 - 32 mmol/L Final  . Glucose, Bld 01/21/2015 86  65 - 99 mg/dL Final  . BUN 01/21/2015 9  6 - 20 mg/dL Final  . Creatinine, Ser 01/21/2015 0.78  0.44 - 1.00 mg/dL Final  . Calcium 01/21/2015 9.3  8.9 - 10.3 mg/dL Final  . Total Protein 01/21/2015 9.0* 6.5 - 8.1 g/dL Final  . Albumin 01/21/2015 4.2  3.5 - 5.0 g/dL Final  . AST 01/21/2015 25  15 - 41 U/L Final  . ALT 01/21/2015 24  14 - 54 U/L Final  . Alkaline Phosphatase 01/21/2015 144* 38 - 126 U/L Final  . Total Bilirubin 01/21/2015 0.5  0.3 - 1.2 mg/dL Final  . GFR calc non Af Amer 01/21/2015 >60  >60 mL/min Final  . GFR calc Af Amer 01/21/2015 >60  >60 mL/min Final   Comment: (NOTE) The eGFR has been calculated using the CKD EPI equation. This calculation has not been validated in all clinical situations. eGFR's persistently <60 mL/min signify possible Chronic Kidney Disease.   . Anion gap 01/21/2015 9  5 - 15 Final    Lab Results  Component Value Date   LABCA2 20.3 06/11/2014     STUDIES: Ct Chest W Contrast  01/24/2015   CLINICAL DATA:  Right upper lobectomy for lung cancer 2014. Restaging. Cough and chest pain for 3 months  EXAM: CT CHEST WITH CONTRAST  TECHNIQUE: Multidetector CT imaging of the chest was performed during intravenous contrast administration.  CONTRAST:  48m OMNIPAQUE IOHEXOL 300 MG/ML  SOLN  COMPARISON:  Chest CT 03/06/2013  FINDINGS: Mediastinum/Nodes: Lymphadenopathy noted as follows:  Pretracheal node, 1.3 cm, image 18, previously 0.8 cm by my measurement at a similar anatomic level previously.  Confluent left hilar lymphadenopathy, 2.3 cm image 24, previously 1.3 cm at similar anatomic level.  Subcarinal lymphadenopathy, now 2.0 cm image 23 compared to my previous measurement of 0.3 cm.  Evidence of at least partial right upper lobectomy.  Heart size at upper limits of  normal. No pericardial effusion. Great vessels are normal  in caliber.  Left Port-A-Cath in place with tip at the cavoatrial junction. Evidence of right mastectomy. Presumed postsurgical change reidentified in the central left breast.  Lungs/Pleura: Bilateral upper lobe/right upper lung zone subpleural interstitial marking prominence is identified, increased in the left since previously and new on the right. These findings could be related to previous radiation therapy. New irregular left upper lobe pleural parenchymal mass, 2.3 x 2.2 cm image 11. No measurable right upper lobe residual mass as compared to the previous finding.  New subpleural irregular pulmonary nodule, 0.7 cm, image 19. Diffuse mild prominence of the interstitial markings noted with hypoaeration that could suggest atelectasis. Central airways are patent.  Upper abdomen: Adrenal glands are unremarkable. Gallbladder decompressed but unremarkable.  Musculoskeletal: Minimal if any callus formation is identified adjacent to right lateral fourth, fifth, and sixth rib fractures with associated pleural thickening.  IMPRESSION: New irregular left upper lobe 2.3 cm mass highly suspicious for recurrent or metastatic lung or breast malignancy.  Mediastinal and hilar lymphadenopathy, also most likely metastatic. If differentiation between lung and breast malignancy is desired, these would likely be accessible at bronchoscopy.  Presumed right upper lung zone interval development of post radiation change without measurable residual mass at the site of previously reported lung cancer status post lobectomy.  New irregular 0.7 cm right upper lung zone pleural parenchymal nodule.  Acute to subacute right lateral fourth through sixth rib fractures with associated pleural thickening. These could be pathologic related to metastatic disease or radiation treatment or posttraumatic. The patient reportedly had thoracotomy therefore these could be postsurgical but the  appearance is less typical for post surgical change.   Electronically Signed   By: Conchita Paris M.D.   On: 01/24/2015 11:00    ASSESSMENT: Patient has new abnormality detected on CT scan.  Some of the changes may be related to radiation therapy however there is a recurrent mass in the left upper lobe with some mediastinal adenopathy  MEDICAL DECISION MAKING:  Repeat PET scan.  Reevaluate patient.  After PET scan is done. EGFR and alk will be evaluated  On original specimen CT scan has been reviewed independently.  Patient expressed understanding and was in agreement with this plan. She also understands that She can call clinic at any time with any questions, concerns, or complaints.    Malignant neoplasm of female breast   Staging form: Breast, AJCC 7th Edition     Clinical: Stage IIIA (T3, N1, M0) - Signed by Forest Gleason, MD on 01/26/2015 Malignant neoplasm of upper lobe, bronchus or lung   Staging form: Lung, AJCC 7th Edition     Clinical: Stage IA (T1b, N0, M0) - Signed by Forest Gleason, MD on 01/26/2015   Forest Gleason, MD   01/26/2015 10:06 AM

## 2015-01-30 ENCOUNTER — Encounter: Payer: Self-pay | Admitting: Oncology

## 2015-01-30 ENCOUNTER — Inpatient Hospital Stay: Payer: 59 | Attending: Oncology | Admitting: Oncology

## 2015-01-30 VITALS — BP 133/77 | HR 108 | Temp 96.4°F | Wt 149.3 lb

## 2015-01-30 DIAGNOSIS — Z923 Personal history of irradiation: Secondary | ICD-10-CM | POA: Diagnosis not present

## 2015-01-30 DIAGNOSIS — Z79811 Long term (current) use of aromatase inhibitors: Secondary | ICD-10-CM | POA: Insufficient documentation

## 2015-01-30 DIAGNOSIS — Z9221 Personal history of antineoplastic chemotherapy: Secondary | ICD-10-CM | POA: Diagnosis not present

## 2015-01-30 DIAGNOSIS — Z85118 Personal history of other malignant neoplasm of bronchus and lung: Secondary | ICD-10-CM | POA: Insufficient documentation

## 2015-01-30 DIAGNOSIS — Z452 Encounter for adjustment and management of vascular access device: Secondary | ICD-10-CM | POA: Diagnosis not present

## 2015-01-30 DIAGNOSIS — Z902 Acquired absence of lung [part of]: Secondary | ICD-10-CM | POA: Diagnosis not present

## 2015-01-30 DIAGNOSIS — R911 Solitary pulmonary nodule: Secondary | ICD-10-CM | POA: Diagnosis not present

## 2015-01-30 DIAGNOSIS — Z9011 Acquired absence of right breast and nipple: Secondary | ICD-10-CM | POA: Insufficient documentation

## 2015-01-30 DIAGNOSIS — C50911 Malignant neoplasm of unspecified site of right female breast: Secondary | ICD-10-CM | POA: Insufficient documentation

## 2015-01-30 DIAGNOSIS — Z170 Estrogen receptor positive status [ER+]: Secondary | ICD-10-CM | POA: Insufficient documentation

## 2015-01-30 DIAGNOSIS — C3411 Malignant neoplasm of upper lobe, right bronchus or lung: Secondary | ICD-10-CM

## 2015-01-30 NOTE — Addendum Note (Signed)
Addended by: Telford Nab on: 01/30/2015 02:41 PM   Modules accepted: Orders

## 2015-01-30 NOTE — Progress Notes (Signed)
Pt former smoker.  Does not have living will.

## 2015-01-30 NOTE — Progress Notes (Signed)
[place] @ [facility] Telephone:(336) [phone]  Fax:(336) [place]: 05-08-1961  MR#: [id number]  XLK#:[id number]  Patient Care Team: [name], MD as PCP - General (Family Medicine) Seeplaputhur Robinette Haines, MD (General Surgery)  CHIEF COMPLAINT:  Chief Complaint  Patient presents with  . Follow-up    Oncology History   8. 54 year old female status post recent left breast cancer in 2005  2. Right breast cancer, locally advanced stage IIIa (pyT3, N1, M0), invasive mammary carcinoma with mucin production. Status post right modified radical mastectomy for 8 cm lesion with one of 4 sentinel lymph nodes positive for metastatic disease. Tumor is ER positive PR negative HER-2/neu not overexpressed. 3. Completed neoadjuvant chemotherapy with chest wall radiation therapy,  Finished on October 21, 2013. 4. Started letrazole 2.5 mg by mouth daily from October 24, 2013. 5. Carcinoma of lung.  Right upper lobe, status post right upper lobectomy, [id number].     Malignant neoplasm of female breast   04/03/2013 Initial Diagnosis Malignant neoplasm of female breast    No flowsheet data found.  INTERVAL HISTORY: 54 year old lady with a history of carcinoma of breast status post right breast mastectomy in history of carcinoma of lung came today further follow-up.  Patient had some bony pain secondary to letrozole therapy but gradually recovering.  No bony pain.  Appetite has been stable.  This and does not smoke.  No cough hemoptysis or chest pain January 30, 2015 Patient is here for ongoing evaluation and treatment consideration had abnormal CT scan with me [place] and hilar lymphadenopathy Patient had a cancer of the right breast radiation on the right breast and right upper lobe lung cancer removed REVIEW OF SYSTEMS:   GENERAL:  Feels good.  Active.  No fevers, sweats or weight loss. PERFORMANCE STATUS (ECOG):0 HEENT:  No visual changes, runny nose, sore throat, mouth sores  or tenderness. Lungs: No shortness of breath or cough.  No hemoptysis. Cardiac:  No chest pain, palpitations, orthopnea, or PND. GI:  No nausea, vomiting, diarrhea, constipation, melena or hematochezia. GU:  No urgency, frequency, dysuria, or hematuria. Musculoskeletal:  No back pain.  No joint pain.  No muscle tenderness. Extremities:  No pain or swelling. Skin:  No rashes or skin changes. Neuro:  No headache, numbness or weakness, balance or coordination issues. Endocrine:  No diabetes, thyroid issues, hot flashes or night sweats. Psych:  No mood changes, depression or anxiety. Pain:  No focal pain. Review of systems:  All other systems reviewed and found to be negative.  As per HPI. Otherwise, a complete review of systems is negatve.  PAST MEDICAL HISTORY: Past Medical History  Diagnosis Date  . Unspecified essential hypertension   . Kidney failure   . Personal history of tobacco use, presenting hazards to health   . Cataract   . Personal history of malignant neoplasm of breast   . Breast screening, unspecified   . Special screening for malignant neoplasms, colon   . Cancer 2001    left breast cancer s/p L/SN/R in 2001  . Cancer 2014    right breast invasive CA, Er pos,Pr neg, Her 2 neg.  . Lung cancer, upper lobe 2014    right upper lobe  . Diabetes mellitus without complication   . MDRO (multiple drug resistant organisms) resistance   . Breast cancer   . H. pylori duodenitis     PAST SURGICAL HISTORY: Past Surgical History  Procedure Laterality Date  . Intercostal nerve  block  2005  . Cataract extraction w/ intraocular lens implant  2005  . Carpal tunnel release Right 2011  . Insertion central venous access device w/ subcutaneous port  12-07-12  . Breast lumpectomy Left 2001  . Breast surgery Right 2014    total mastectomy  . Right lung upper lobectomy  03/2013  . Lobectomy Right   . Cataract extraction      FAMILY HISTORY Family History  Problem Relation Age  of Onset  . Diabetes Other   . Hyperlipidemia Other   . Hypertension Other     GYNECOLOGIC HISTORY:  No LMP recorded. Patient is postmenopausal.     ADVANCED DIRECTIVES: Patient does not have any advanced healthcare directive. Information has been given.   HEALTH MAINTENANCE: History  Substance Use Topics  . Smoking status: Former Smoker -- 0.50 packs/day for 30 years    Types: Cigarettes  . Smokeless tobacco: Never Used     Comment: using 107m nicorette gum- 1/2 piece, 10 times per day  . Alcohol Use: No      Allergies  Allergen Reactions  . Metformin Diarrhea and Nausea Only  . Other Rash    Sage Wipes    Current Outpatient Prescriptions  Medication Sig Dispense Refill  . acetaminophen (TYLENOL) 325 MG tablet Take 650 mg by mouth every 6 (six) hours as needed for pain.    .Marland Kitchengabapentin (NEURONTIN) 300 MG capsule Take 300 mg by mouth as needed.    .Marland KitchenglipiZIDE (GLUCOTROL XL) 5 MG 24 hr tablet Take by mouth.    . hydrochlorothiazide (HYDRODIURIL) 25 MG tablet Take 25 mg by mouth daily.    .Marland Kitchenletrozole (FEMARA) 2.5 MG tablet Take 2.5 mg by mouth daily.    . Multiple Vitamins-Minerals (MULTIVITAMIN PO) Take by mouth.    . naproxen sodium (ANAPROX) 220 MG tablet Take 220 mg by mouth every 8 (eight) hours as needed.    . nicotine polacrilex (NICORETTE) 2 MG gum Take 2 mg by mouth 5 (five) times daily.     . simvastatin (ZOCOR) 10 MG tablet Take 10 mg by mouth daily.    . hydrochlorothiazide (HYDRODIURIL) 25 MG tablet Take 25 mg by mouth daily. Take half daily    . PARoxetine Mesylate (BRISDELLE PO) Take 1 tablet by mouth at bedtime.     No current facility-administered medications for this visit.    OBJECTIVE:  Filed Vitals:   01/30/15 0911  BP: 133/77  Pulse: 108  Temp: 96.4 F (35.8 C)     Body mass index is 27.29 kg/(m^2).    ECOG FS:0 - Asymptomatic  PHYSICAL EXAM: GENERAL:  Well developed, well nourished, sitting comfortably in the exam room in no acute  distress. MENTAL STATUS:  Alert and oriented to person, place and time. HEAD:   Normocephalic, atraumatic, face symmetric, no Cushingoid features. EYES:    Pupils equal round and reactive to light and accomodation.  No conjunctivitis or scleral icterus. ENT:  Oropharynx clear without lesion.  Tongue normal. Mucous membranes moist.  RESPIRATORY:  Clear to auscultation without rales, wheezes or rhonchi. CARDIOVASCULAR:  Regular rate and rhythm without murmur, rub or gallop. BREAST:  Right breast : Status post mastectomy.  No evidence of recurrent disease skin changes or nipple discharge.  Left breast without masses, skin changes or nipple discharge. ABDOMEN:  Soft, non-tender, with active bowel sounds, and no hepatosplenomegaly.  No masses. BACK:  No CVA tenderness.  No tenderness on percussion of the back or rib cage.  SKIN:  No rashes, ulcers or lesions. EXTREMITIES: No edema, no skin discoloration or tenderness.  No palpable cords. LYMPH NODES: No palpable cervical, supraclavicular, axillary or inguinal adenopathy  NEUROLOGICAL: Unremarkable. PSYCH:  Appropriate.   LAB RESULTS:  No visits with results within 3 Day(s) from this visit. Latest known visit with results is:  Appointment on 01/21/2015  Component Date Value Ref Range Status  . WBC 01/21/2015 12.9* 3.6 - 11.0 K/uL Final  . RBC 01/21/2015 4.05  3.80 - 5.20 MIL/uL Final  . Hemoglobin 01/21/2015 12.5  12.0 - 16.0 g/dL Final  . HCT 01/21/2015 37.4  35.0 - 47.0 % Final  . MCV 01/21/2015 92.3  80.0 - 100.0 fL Final  . MCH 01/21/2015 30.9  26.0 - 34.0 pg Final  . MCHC 01/21/2015 33.5  32.0 - 36.0 g/dL Final  . RDW 01/21/2015 13.7  11.5 - 14.5 % Final  . Platelets 01/21/2015 384  150 - 440 K/uL Final  . Neutrophils Relative % 01/21/2015 71   Final  . Neutro Abs 01/21/2015 9.1* 1.4 - 6.5 K/uL Final  . Lymphocytes Relative 01/21/2015 16   Final  . Lymphs Abs 01/21/2015 2.1  1.0 - 3.6 K/uL Final  . Monocytes Relative 01/21/2015 9    Final  . Monocytes Absolute 01/21/2015 1.2* 0.2 - 0.9 K/uL Final  . Eosinophils Relative 01/21/2015 3   Final  . Eosinophils Absolute 01/21/2015 0.4  0 - 0.7 K/uL Final  . Basophils Relative 01/21/2015 1   Final  . Basophils Absolute 01/21/2015 0.1  0 - 0.1 K/uL Final  . Sodium 01/21/2015 133* 135 - 145 mmol/L Final  . Potassium 01/21/2015 3.8  3.5 - 5.1 mmol/L Final  . Chloride 01/21/2015 97* 101 - 111 mmol/L Final  . CO2 01/21/2015 27  22 - 32 mmol/L Final  . Glucose, Bld 01/21/2015 86  65 - 99 mg/dL Final  . BUN 01/21/2015 9  6 - 20 mg/dL Final  . Creatinine, Ser 01/21/2015 0.78  0.44 - 1.00 mg/dL Final  . Calcium 01/21/2015 9.3  8.9 - 10.3 mg/dL Final  . Total Protein 01/21/2015 9.0* 6.5 - 8.1 g/dL Final  . Albumin 01/21/2015 4.2  3.5 - 5.0 g/dL Final  . AST 01/21/2015 25  15 - 41 U/L Final  . ALT 01/21/2015 24  14 - 54 U/L Final  . Alkaline Phosphatase 01/21/2015 144* 38 - 126 U/L Final  . Total Bilirubin 01/21/2015 0.5  0.3 - 1.2 mg/dL Final  . GFR calc non Af Amer 01/21/2015 >60  >60 mL/min Final  . GFR calc Af Amer 01/21/2015 >60  >60 mL/min Final   Comment: (NOTE) The eGFR has been calculated using the CKD EPI equation. This calculation has not been validated in all clinical situations. eGFR's persistently <60 mL/min signify possible Chronic Kidney Disease.   . Anion gap 01/21/2015 9  5 - 15 Final    Lab Results  Component Value Date   LABCA2 20.3 06/11/2014     STUDIES: Ct Chest W Contrast  01/24/2015   CLINICAL DATA:  Right upper lobectomy for lung cancer 2014. Restaging. Cough and chest pain for 3 months  EXAM: CT CHEST WITH CONTRAST  TECHNIQUE: Multidetector CT imaging of the chest was performed during intravenous contrast administration.  CONTRAST:  94m OMNIPAQUE IOHEXOL 300 MG/ML  SOLN  COMPARISON:  Chest CT 03/06/2013  FINDINGS: Mediastinum/Nodes: Lymphadenopathy noted as follows:  Pretracheal node, 1.3 cm, image 18, previously 0.8 cm by my measurement at a  similar  anatomic level previously.  Confluent left hilar lymphadenopathy, 2.3 cm image 24, previously 1.3 cm at similar anatomic level.  Subcarinal lymphadenopathy, now 2.0 cm image 23 compared to my previous measurement of 0.3 cm.  Evidence of at least partial right upper lobectomy.  Heart size at upper limits of normal. No pericardial effusion. Great vessels are normal in caliber.  Left Port-A-Cath in place with tip at the cavoatrial junction. Evidence of right mastectomy. Presumed postsurgical change reidentified in the central left breast.  Lungs/Pleura: Bilateral upper lobe/right upper lung zone subpleural interstitial marking prominence is identified, increased in the left since previously and new on the right. These findings could be related to previous radiation therapy. New irregular left upper lobe pleural parenchymal mass, 2.3 x 2.2 cm image 11. No measurable right upper lobe residual mass as compared to the previous finding.  New subpleural irregular pulmonary nodule, 0.7 cm, image 19. Diffuse mild prominence of the interstitial markings noted with hypoaeration that could suggest atelectasis. Central airways are patent.  Upper abdomen: Adrenal glands are unremarkable. Gallbladder decompressed but unremarkable.  Musculoskeletal: Minimal if any callus formation is identified adjacent to right lateral fourth, fifth, and sixth rib fractures with associated pleural thickening.  IMPRESSION: New irregular left upper lobe 2.3 cm mass highly suspicious for recurrent or metastatic lung or breast malignancy.  Mediastinal and hilar lymphadenopathy, also most likely metastatic. If differentiation between lung and breast malignancy is desired, these would likely be accessible at bronchoscopy.  Presumed right upper lung zone interval development of post radiation change without measurable residual mass at the site of previously reported lung cancer status post lobectomy.  New irregular 0.7 cm right upper lung zone  pleural parenchymal nodule.  Acute to subacute right lateral fourth through sixth rib fractures with associated pleural thickening. These could be pathologic related to metastatic disease or radiation treatment or posttraumatic. The patient reportedly had thoracotomy therefore these could be postsurgical but the appearance is less typical for post surgical change.   Electronically Signed   By: Conchita Paris M.D.   On: 01/24/2015 11:00    ASSESSMENT: Patient has new abnormality detected on CT scan.  Some of the changes may be related to radiation therapy however there is a recurrent mass in the left upper lobe with some mediastinal adenopathy  MEDICAL DECISION MAKING:  Repeat PET scan.  Reevaluate patient.  After PET scan is done. EGFR and alk will be evaluated  On original specimen CT scan has been reviewed independently. Proceed with PET scanning Discussed case in tumor conference Call pulmonologist ask him to review CT scan to decide whether EBUS will be useful  Patient expressed understanding and was in agreement with this plan. She also understands that She can call clinic at any time with any questions, concerns, or complaints.    Malignant neoplasm of female breast   Staging form: Breast, AJCC 7th Edition     Clinical: Stage IIIA (T3, N1, M0) - Signed by Forest Gleason, MD on 01/26/2015 Malignant neoplasm of upper lobe, bronchus or lung   Staging form: Lung, AJCC 7th Edition     Clinical: Stage IA (T1b, N0, M0) - Signed by Forest Gleason, MD on 01/26/2015   Forest Gleason, MD   01/30/2015 9:28 AM

## 2015-02-01 ENCOUNTER — Ambulatory Visit
Admission: RE | Admit: 2015-02-01 | Discharge: 2015-02-01 | Disposition: A | Discharge status: Home / Self Care | Payer: 59 | Source: Ambulatory Visit | Attending: Oncology | Admitting: Oncology

## 2015-02-01 ENCOUNTER — Other Ambulatory Visit: Payer: Self-pay | Admitting: Oncology

## 2015-02-01 DIAGNOSIS — C3411 Malignant neoplasm of upper lobe, right bronchus or lung: Secondary | ICD-10-CM

## 2015-02-01 DIAGNOSIS — Z9011 Acquired absence of right breast and nipple: Secondary | ICD-10-CM | POA: Diagnosis not present

## 2015-02-01 DIAGNOSIS — Z853 Personal history of malignant neoplasm of breast: Secondary | ICD-10-CM | POA: Diagnosis not present

## 2015-02-01 DIAGNOSIS — R911 Solitary pulmonary nodule: Secondary | ICD-10-CM | POA: Insufficient documentation

## 2015-02-01 DIAGNOSIS — C3491 Malignant neoplasm of unspecified part of right bronchus or lung: Secondary | ICD-10-CM | POA: Diagnosis present

## 2015-02-01 LAB — GLUCOSE, CAPILLARY: Glucose-Capillary: 98 mg/dL (ref 65–99)

## 2015-02-01 MED ORDER — FLUDEOXYGLUCOSE F - 18 (FDG) INJECTION
12.4200 | Freq: Once | INTRAVENOUS | Status: AC | PRN
  Administered 2015-02-01: 12.42 via INTRAVENOUS

## 2015-02-04 ENCOUNTER — Inpatient Hospital Stay: Admission: RE | Admit: 2015-02-04 | Payer: 59 | Source: Ambulatory Visit

## 2015-02-04 ENCOUNTER — Encounter: Payer: Self-pay | Admitting: *Deleted

## 2015-02-04 ENCOUNTER — Inpatient Hospital Stay: Payer: 59

## 2015-02-04 ENCOUNTER — Ambulatory Visit: Payer: 59 | Admitting: Oncology

## 2015-02-04 ENCOUNTER — Telehealth: Payer: Self-pay | Admitting: *Deleted

## 2015-02-04 DIAGNOSIS — Z9011 Acquired absence of right breast and nipple: Secondary | ICD-10-CM | POA: Diagnosis not present

## 2015-02-04 DIAGNOSIS — Z9221 Personal history of antineoplastic chemotherapy: Secondary | ICD-10-CM | POA: Diagnosis not present

## 2015-02-04 DIAGNOSIS — Z87891 Personal history of nicotine dependence: Secondary | ICD-10-CM | POA: Diagnosis not present

## 2015-02-04 DIAGNOSIS — C801 Malignant (primary) neoplasm, unspecified: Secondary | ICD-10-CM

## 2015-02-04 DIAGNOSIS — C771 Secondary and unspecified malignant neoplasm of intrathoracic lymph nodes: Secondary | ICD-10-CM | POA: Diagnosis not present

## 2015-02-04 DIAGNOSIS — I129 Hypertensive chronic kidney disease with stage 1 through stage 4 chronic kidney disease, or unspecified chronic kidney disease: Secondary | ICD-10-CM | POA: Diagnosis not present

## 2015-02-04 DIAGNOSIS — C50911 Malignant neoplasm of unspecified site of right female breast: Secondary | ICD-10-CM | POA: Diagnosis not present

## 2015-02-04 DIAGNOSIS — Z1630 Resistance to unspecified antimicrobial drugs: Secondary | ICD-10-CM | POA: Diagnosis not present

## 2015-02-04 DIAGNOSIS — Z902 Acquired absence of lung [part of]: Secondary | ICD-10-CM | POA: Diagnosis not present

## 2015-02-04 DIAGNOSIS — Z923 Personal history of irradiation: Secondary | ICD-10-CM | POA: Diagnosis not present

## 2015-02-04 DIAGNOSIS — Z85118 Personal history of other malignant neoplasm of bronchus and lung: Secondary | ICD-10-CM | POA: Diagnosis not present

## 2015-02-04 DIAGNOSIS — Z853 Personal history of malignant neoplasm of breast: Secondary | ICD-10-CM | POA: Diagnosis not present

## 2015-02-04 DIAGNOSIS — E1122 Type 2 diabetes mellitus with diabetic chronic kidney disease: Secondary | ICD-10-CM | POA: Diagnosis not present

## 2015-02-04 DIAGNOSIS — N189 Chronic kidney disease, unspecified: Secondary | ICD-10-CM | POA: Diagnosis not present

## 2015-02-04 DIAGNOSIS — R590 Localized enlarged lymph nodes: Secondary | ICD-10-CM | POA: Diagnosis present

## 2015-02-04 MED ORDER — HEPARIN SOD (PORK) LOCK FLUSH 100 UNIT/ML IV SOLN
500.0000 [IU] | Freq: Once | INTRAVENOUS | Status: AC
Start: 2015-02-04 — End: 2015-02-04
  Administered 2015-02-04: 500 [IU] via INTRAVENOUS

## 2015-02-04 MED ORDER — SODIUM CHLORIDE 0.9 % IJ SOLN
10.0000 mL | INTRAMUSCULAR | Status: AC | PRN
  Administered 2015-02-04: 10 mL
  Filled 2015-02-04: qty 10

## 2015-02-04 MED ORDER — HEPARIN SOD (PORK) LOCK FLUSH 100 UNIT/ML IV SOLN
INTRAVENOUS | Status: AC
  Filled 2015-02-04: qty 5

## 2015-02-04 NOTE — Patient Instructions (Signed)
  Your procedure is scheduled on: 02-05-15 Report to [facility] To find out your arrival time please call [phone] between 1PM - 3PM-PT AWARE OF 9 AM ARRIVAL TIME  Remember: Instructions that are not followed completely may result in serious medical risk, up to and including death, or upon the discretion of your surgeon and anesthesiologist your surgery may need to be rescheduled.    _X___ 1. Do not eat food or drink liquids after midnight. No gum chewing or hard candies.     _X___ 2. No Alcohol for 24 hours before or after surgery.   ____ 3. Bring all medications with you on the day of surgery if instructed.    ____ 4. Notify your doctor if there is any change in your medical condition     (cold, fever, infections).     Do not wear jewelry, make-up, hairpins, clips or nail polish.  Do not wear lotions, powders, or perfumes. You may wear deodorant.  Do not shave 48 hours prior to surgery. Men may shave face and neck.  Do not bring valuables to the hospital.    [facility] is not responsible for any belongings or valuables.               Contacts, dentures or bridgework may not be worn into surgery.  Leave your suitcase in the car. After surgery it may be brought to your room.  For patients admitted to the hospital, discharge time is determined by your                treatment team.   Patients discharged the day of surgery will not be allowed to drive home.   Please read over the following fact sheets that you were given:      ____ Take these medicines the morning of surgery with A SIP OF WATER:    1. NONE  2.   3.   4.  5.  6.  ____ Fleet Enema (as directed)   ____ Use CHG Soap as directed  ____ Use inhalers on the day of surgery  ____ Stop metformin 2 days prior to surgery    ____ Take 1/2 of usual insulin dose the night before surgery and none on the morning of surgery.   ____ Stop Coumadin/Plavix/aspirin N/A  ____ Stop Anti-inflammatories-NO NSAIDS-PT  HAS STOPPED NAPROXEN DAYS AGO-TYLENOL OK   ____ Stop supplements until after surgery.    ____ Bring C-Pap to the hospital.

## 2015-02-04 NOTE — Telephone Encounter (Signed)
Oncology Nurse Navigator Documentation  Oncology Nurse Navigator Flowsheets 02/04/2015  Navigator Encounter Type Telephone  Time Spent with Patient 15   Notified patient of pre-op appointment by phone today after noon. Also notified of procedure scheduled for 02/05/15 9:30am [facility] [place] arrival. Reminded to be NPO after midnight, continue to avoid anticoagulant medications, and have a driver and support person to be with patient at home after the procedure. Patient verbalizes understanding and reads back appointments.

## 2015-02-05 ENCOUNTER — Ambulatory Visit: Payer: 59 | Admitting: Anesthesiology

## 2015-02-05 ENCOUNTER — Encounter: Payer: Self-pay | Admitting: *Deleted

## 2015-02-05 ENCOUNTER — Ambulatory Visit
Admission: RE | Admit: 2015-02-05 | Discharge: 2015-02-05 | Disposition: A | Discharge status: Home / Self Care | Payer: 59 | Source: Ambulatory Visit | Attending: Internal Medicine | Admitting: Internal Medicine

## 2015-02-05 ENCOUNTER — Encounter
Admission: RE | Disposition: A | Discharge status: Home / Self Care | Payer: Self-pay | Source: Ambulatory Visit | Attending: Internal Medicine

## 2015-02-05 DIAGNOSIS — E1122 Type 2 diabetes mellitus with diabetic chronic kidney disease: Secondary | ICD-10-CM | POA: Insufficient documentation

## 2015-02-05 DIAGNOSIS — Z902 Acquired absence of lung [part of]: Secondary | ICD-10-CM | POA: Insufficient documentation

## 2015-02-05 DIAGNOSIS — Z1630 Resistance to unspecified antimicrobial drugs: Secondary | ICD-10-CM | POA: Insufficient documentation

## 2015-02-05 DIAGNOSIS — C3411 Malignant neoplasm of upper lobe, right bronchus or lung: Secondary | ICD-10-CM

## 2015-02-05 DIAGNOSIS — N189 Chronic kidney disease, unspecified: Secondary | ICD-10-CM | POA: Insufficient documentation

## 2015-02-05 DIAGNOSIS — I129 Hypertensive chronic kidney disease with stage 1 through stage 4 chronic kidney disease, or unspecified chronic kidney disease: Secondary | ICD-10-CM | POA: Insufficient documentation

## 2015-02-05 DIAGNOSIS — C771 Secondary and unspecified malignant neoplasm of intrathoracic lymph nodes: Secondary | ICD-10-CM | POA: Insufficient documentation

## 2015-02-05 DIAGNOSIS — R590 Localized enlarged lymph nodes: Secondary | ICD-10-CM

## 2015-02-05 DIAGNOSIS — Z853 Personal history of malignant neoplasm of breast: Secondary | ICD-10-CM | POA: Insufficient documentation

## 2015-02-05 DIAGNOSIS — Z923 Personal history of irradiation: Secondary | ICD-10-CM | POA: Insufficient documentation

## 2015-02-05 DIAGNOSIS — Z9221 Personal history of antineoplastic chemotherapy: Secondary | ICD-10-CM | POA: Insufficient documentation

## 2015-02-05 DIAGNOSIS — Z87891 Personal history of nicotine dependence: Secondary | ICD-10-CM | POA: Insufficient documentation

## 2015-02-05 DIAGNOSIS — Z9011 Acquired absence of right breast and nipple: Secondary | ICD-10-CM | POA: Insufficient documentation

## 2015-02-05 DIAGNOSIS — Z85118 Personal history of other malignant neoplasm of bronchus and lung: Secondary | ICD-10-CM | POA: Insufficient documentation

## 2015-02-05 HISTORY — PX: ENDOBRONCHIAL ULTRASOUND: SHX5096

## 2015-02-05 HISTORY — DX: Adverse effect of unspecified anesthetic, initial encounter: T41.45XA

## 2015-02-05 HISTORY — DX: Other complications of anesthesia, initial encounter: T88.59XA

## 2015-02-05 LAB — GLUCOSE, CAPILLARY: Glucose-Capillary: 108 mg/dL — ABNORMAL HIGH (ref 65–99)

## 2015-02-05 SURGERY — ENDOBRONCHIAL ULTRASOUND (EBUS)
Anesthesia: General

## 2015-02-05 MED ORDER — SODIUM CHLORIDE 0.9 % IV SOLN
Freq: Once | INTRAVENOUS | Status: AC
  Administered 2015-02-05: 09:00:00 via INTRAVENOUS

## 2015-02-05 MED ORDER — MIDAZOLAM HCL 2 MG/2ML IJ SOLN
INTRAMUSCULAR | Status: DC | PRN
  Administered 2015-02-05: 2 mg via INTRAVENOUS

## 2015-02-05 MED ORDER — ONDANSETRON HCL 4 MG/2ML IJ SOLN: 4.0000 mg | Freq: Once | INTRAMUSCULAR | Status: DC | PRN

## 2015-02-05 MED ORDER — FAMOTIDINE 20 MG PO TABS
20.0000 mg | ORAL_TABLET | Freq: Once | ORAL | Status: AC
  Administered 2015-02-05: 20 mg via ORAL

## 2015-02-05 MED ORDER — PROPOFOL 10 MG/ML IV BOLUS
INTRAVENOUS | Status: DC | PRN
  Administered 2015-02-05: 160 mg via INTRAVENOUS

## 2015-02-05 MED ORDER — SUGAMMADEX SODIUM 200 MG/2ML IV SOLN
INTRAVENOUS | Status: DC | PRN
  Administered 2015-02-05: 240 mg via INTRAVENOUS

## 2015-02-05 MED ORDER — SODIUM CHLORIDE 0.9 % IV SOLN
INTRAVENOUS | Status: DC | PRN
Start: 2015-02-05 — End: 2015-02-05
  Administered 2015-02-05: 11:00:00 via INTRAVENOUS

## 2015-02-05 MED ORDER — ROCURONIUM BROMIDE 100 MG/10ML IV SOLN
INTRAVENOUS | Status: DC | PRN
  Administered 2015-02-05: 50 mg via INTRAVENOUS

## 2015-02-05 MED ORDER — FENTANYL CITRATE (PF) 100 MCG/2ML IJ SOLN
25.0000 ug | INTRAMUSCULAR | Status: DC | PRN
  Administered 2015-02-05 (×2): 25 ug via INTRAVENOUS

## 2015-02-05 MED ORDER — LIDOCAINE HCL (CARDIAC) 20 MG/ML IV SOLN
INTRAVENOUS | Status: DC | PRN
  Administered 2015-02-05: 50 mg via INTRAVENOUS

## 2015-02-05 MED ORDER — FENTANYL CITRATE (PF) 100 MCG/2ML IJ SOLN
INTRAMUSCULAR | Status: AC
  Filled 2015-02-05: qty 2

## 2015-02-05 MED ORDER — PROPOFOL INFUSION 10 MG/ML OPTIME
INTRAVENOUS | Status: DC | PRN
  Administered 2015-02-05: 150 ug/kg/min via INTRAVENOUS

## 2015-02-05 MED ORDER — FENTANYL CITRATE (PF) 100 MCG/2ML IJ SOLN
INTRAMUSCULAR | Status: DC | PRN
  Administered 2015-02-05: 100 ug via INTRAVENOUS

## 2015-02-05 MED ORDER — FAMOTIDINE 20 MG PO TABS
ORAL_TABLET | ORAL | Status: AC
  Administered 2015-02-05: 20 mg via ORAL
  Filled 2015-02-05: qty 1

## 2015-02-05 NOTE — Interval H&P Note (Signed)
History and Physical Interval Note:  02/05/2015 10:46 AM  Brittney Lam  has presented today for surgery, with the diagnosis of LEFT UPPER LOBE MASS and Signifcant Mediastinal Adenopathy The various methods of treatment have been discussed with the patient and family. After consideration of risks, benefits and other options for treatment, the patient has consented to  Procedure(s): ENDOBRONCHIAL ULTRASOUND (N/A) as a surgical intervention .  The patient's history has been reviewed, patient examined, no change in status, stable for surgery.  I have reviewed the patient's chart and labs.  Questions were answered to the patient's satisfaction.     Flora Lipps

## 2015-02-05 NOTE — Anesthesia Procedure Notes (Signed)
Procedure Name: Intubation Date/Time: 02/05/2015 11:03 AM Performed by: Rolla Plate Pre-anesthesia Checklist: Patient identified, Emergency Drugs available, Timeout performed, Suction available and Patient being monitored Patient Re-evaluated:Patient Re-evaluated prior to inductionOxygen Delivery Method: Circle system utilized Preoxygenation: Pre-oxygenation with 100% oxygen Intubation Type: IV induction Ventilation: Mask ventilation without difficulty Laryngoscope Size: Miller and 2 Grade View: Grade I Tube type: Oral Tube size: 7.0 mm Number of attempts: 1 Airway Equipment and Method: Stylet Placement Confirmation: ETT inserted through vocal cords under direct vision,  positive ETCO2 and breath sounds checked- equal and bilateral Secured at: 21 cm Tube secured with: Tape Dental Injury: Teeth and Oropharynx as per pre-operative assessment

## 2015-02-05 NOTE — Anesthesia Postprocedure Evaluation (Signed)
  Anesthesia Post-op Note  Patient: Brittney Lam  Procedure(s) Performed: Procedure(s): ENDOBRONCHIAL ULTRASOUND (N/A)  Anesthesia type:General  Patient location: PACU  Post pain: Pain level controlled  Post assessment: Post-op Vital signs reviewed, Patient's Cardiovascular Status Stable, Respiratory Function Stable, Patent Airway and No signs of Nausea or vomiting  Post vital signs: Reviewed and stable  Last Vitals:  Filed Vitals:   02/05/15 1150  BP: 95/62  Pulse:   Temp: 37 C  Resp:     Level of consciousness: awake, alert  and patient cooperative  Complications: No apparent anesthesia complications

## 2015-02-05 NOTE — Anesthesia Preprocedure Evaluation (Signed)
Anesthesia Evaluation  Patient identified by MRN, date of birth, ID band Patient awake    Reviewed: Allergy & Precautions, NPO status , Patient's Chart, lab work & pertinent test results  History of Anesthesia Complications (+) history of anesthetic complications  Airway Mallampati: II  TM Distance: >3 FB Neck ROM: Full    Dental  (+) Chipped, Missing, Dental Advisory Given,    Pulmonary former smoker,  breath sounds clear to auscultation        Cardiovascular hypertension, Normal cardiovascular examRhythm:Regular Rate:Normal     Neuro/Psych negative neurological ROS  negative psych ROS   GI/Hepatic negative GI ROS, Neg liver ROS,   Endo/Other  negative endocrine ROSdiabetes, Poorly Controlled, Type 2  Renal/GU Renal InsufficiencyRenal disease  negative genitourinary   Musculoskeletal negative musculoskeletal ROS (+)   Abdominal Normal abdominal exam  (+)   Peds negative pediatric ROS (+)  Hematology negative hematology ROS (+)   Anesthesia Other Findings   Reproductive/Obstetrics                             Anesthesia Physical Anesthesia Plan  ASA: III  Anesthesia Plan: General   Post-op Pain Management:    Induction: Intravenous  Airway Management Planned: Oral ETT  Additional Equipment:   Intra-op Plan:   Post-operative Plan: Extubation in OR  Informed Consent: I have reviewed the patients History and Physical, chart, labs and discussed the procedure including the risks, benefits and alternatives for the proposed anesthesia with the patient or authorized representative who has indicated his/her understanding and acceptance.   Dental advisory given  Plan Discussed with: CRNA and Surgeon  Anesthesia Plan Comments:         Anesthesia Quick Evaluation

## 2015-02-05 NOTE — Op Note (Signed)
PROCEDURE: ENDOBRONCHIAL ULTRASOUND   PROCEDURE DATE: 02/05/2015  TIME:  NAME:  ERLINE SIDDOWAY  DOB:1961/01/29  MRN: [id number] LOC:  ARPO/None    HOSP DAY: '@LENGTHOFSTAYDAYS'$ @ CODE STATUS:        Indications/Preliminary Diagnosis: Mediastinal Adenopathy  Consent: (Place X beside choice/s below)  The benefits, risks and possible complications of the procedure were        explained to:  _x__ patient  _x__ patient's family  ___ other:___________  who verbalized understanding and gave:  ___ verbal  ___ written  _x__ verbal and written  ___ telephone  ___ other:________ consent.      Unable to obtain consent; procedure performed on emergent basis.     Other:       PRESEDATION ASSESSMENT: History and Physical has been performed. Patient meds and allergies have been reviewed. Presedation airway examination has been performed and documented. Baseline vital signs, sedation score, oxygenation status, and cardiac rhythm were reviewed. Patient was deemed to be in satisfactory condition to undergo the procedure.    PREMEDICATIONS: SEE ANESTHESIOLOGY RECORDS   Airway Prep (Place X beside choice below)   1% Transtracheal Lidocaine Anesthetization 7 cc   Patient prepped per Bronchoscopy Lab Policy       Insertion Route (Place X beside choice below)   Nasal   Oral   Endotracheal Tube   Tracheostomy   INTRAPROCEDURE MEDICATIONS: SEE ANESTHESIOLOGY RECORDS   PROCEDURE DETAILS: Timeout performed and correct patient, name, & ID confirmed. Following prep per Pulmonary policy, appropriate sedation was administered.  EBUS scope inserted via ETT tube. Airway exam proceeded with findings, technical procedures, and specimen collection as noted below. At the end of exam the scope was withdrawn without incident. Impression and Plan as noted below.       TECHNICAL PROCEDURES: (Place X beside choice below)   Procedures  Description    None     Electrocautery     Cryotherapy     Balloon  Dilatation     Bronchography     Stent Placement     Therapeutic Aspiration     Laser/Argon Plasma    Brachytherapy Catheter Placement    Foreign Body Removal     SPECIMENS (Sites): (Place X beside choice below)  Specimens Description   No Specimens Obtained     Washings    Lavage    Biopsies   x Fine Needle Aspirates FNA 21 gauge x 4 from RT paratracheal, FNA 21 gauge x 4 Left Hilar   Brushings    Sputum    FINDINGS: There were several Large approx 3x3 cm masses seen under Ultrasound-RT PARATREACHEAL MASS AND LEFT HILAR MASS Several NEEDLE TRANSBRONCHIAL ASPIRATES TAKEN 4 from RT paratracheal,  4 from left hilar  ESTIMATED BLOOD LOSS: none COMPLICATIONS/RESOLUTION: none       IMPRESSION:POST-PROCEDURE DX: likely malignant, high probability of metastatic disease    RECOMMENDATION/PLAN: follow up pathology reports    Corrin Parker, M.D. Pulmonary & [place] Director Intensive Care Unit

## 2015-02-05 NOTE — Interval H&P Note (Signed)
History and Physical Interval Note:  02/05/2015 10:44 AM  Brittney Lam  has presented today for surgery, with the diagnosis of LEFT UPPER LOBE MASS and significant Mediastinal Adenopathy The various methods of treatment have been discussed with the patient and family. After consideration of risks, benefits and other options for treatment, the patient has consented to  Procedure(s): ENDOBRONCHIAL ULTRASOUND (N/A) as a surgical intervention .  The patient's history has been reviewed, patient examined, no change in status, stable for surgery.  I have reviewed the patient's chart and labs.  Questions were answered to the patient's satisfaction.     Flora Lipps

## 2015-02-05 NOTE — H&P (View-Only) (Signed)
[place] @ [facility] [phone]  Fax:([phone]) [place]: November 03, 1960  MR#: [id number]  JYN#:[id number]  Patient Care Team: [name], MD as PCP - General (Family Medicine) Seeplaputhur Robinette Haines, MD (General Surgery)  CHIEF COMPLAINT:  Chief Complaint  Patient presents with  . Follow-up    Oncology History   44. 54 year old female status post recent left breast cancer in 2005  2. Right breast cancer, locally advanced stage IIIa (pyT3, N1, M0), invasive mammary carcinoma with mucin production. Status post right modified radical mastectomy for 8 cm lesion with one of 4 sentinel lymph nodes positive for metastatic disease. Tumor is ER positive PR negative HER-2/neu not overexpressed. 3. Completed neoadjuvant chemotherapy with chest wall radiation therapy,  Finished on October 21, 2013. 4. Started letrazole 2.5 mg by mouth daily from October 24, 2013. 5. Carcinoma of lung.  Right upper lobe, status post right upper lobectomy, [id number].     Malignant neoplasm of female breast   04/03/2013 Initial Diagnosis Malignant neoplasm of female breast    No flowsheet data found.  INTERVAL HISTORY: 54 year old lady with a history of carcinoma of breast status post right breast mastectomy in history of carcinoma of lung came today further follow-up.  Patient had some bony pain secondary to letrozole therapy but gradually recovering.  No bony pain.  Appetite has been stable.  This and does not smoke.  No cough hemoptysis or chest pain January 30, 2015 Patient is here for ongoing evaluation and treatment consideration had abnormal CT scan with me [place] and hilar lymphadenopathy Patient had a cancer of the right breast radiation on the right breast and right upper lobe lung cancer removed REVIEW OF SYSTEMS:   GENERAL:  Feels good.  Active.  No fevers, sweats or weight loss. PERFORMANCE STATUS (ECOG):0 HEENT:  No visual changes, runny nose, sore throat, mouth sores  or tenderness. Lungs: No shortness of breath or cough.  No hemoptysis. Cardiac:  No chest pain, palpitations, orthopnea, or PND. GI:  No nausea, vomiting, diarrhea, constipation, melena or hematochezia. GU:  No urgency, frequency, dysuria, or hematuria. Musculoskeletal:  No back pain.  No joint pain.  No muscle tenderness. Extremities:  No pain or swelling. Skin:  No rashes or skin changes. Neuro:  No headache, numbness or weakness, balance or coordination issues. Endocrine:  No diabetes, thyroid issues, hot flashes or night sweats. Psych:  No mood changes, depression or anxiety. Pain:  No focal pain. Review of systems:  All other systems reviewed and found to be negative.  As per HPI. Otherwise, a complete review of systems is negatve.  PAST MEDICAL HISTORY: Past Medical History  Diagnosis Date  . Unspecified essential hypertension   . Kidney failure   . Personal history of tobacco use, presenting hazards to health   . Cataract   . Personal history of malignant neoplasm of breast   . Breast screening, unspecified   . Special screening for malignant neoplasms, colon   . Cancer 2001    left breast cancer s/p L/SN/R in 2001  . Cancer 2014    right breast invasive CA, Er pos,Pr neg, Her 2 neg.  . Lung cancer, upper lobe 2014    right upper lobe  . Diabetes mellitus without complication   . MDRO (multiple drug resistant organisms) resistance   . Breast cancer   . H. pylori duodenitis     PAST SURGICAL HISTORY: Past Surgical History  Procedure Laterality Date  . Intercostal nerve  block  2005  . Cataract extraction w/ intraocular lens implant  2005  . Carpal tunnel release Right 2011  . Insertion central venous access device w/ subcutaneous port  12-07-12  . Breast lumpectomy Left 2001  . Breast surgery Right 2014    total mastectomy  . Right lung upper lobectomy  03/2013  . Lobectomy Right   . Cataract extraction      FAMILY HISTORY Family History  Problem Relation Age  of Onset  . Diabetes Other   . Hyperlipidemia Other   . Hypertension Other     GYNECOLOGIC HISTORY:  No LMP recorded. Patient is postmenopausal.     ADVANCED DIRECTIVES: Patient does not have any advanced healthcare directive. Information has been given.   HEALTH MAINTENANCE: History  Substance Use Topics  . Smoking status: Former Smoker -- 0.50 packs/day for 30 years    Types: Cigarettes  . Smokeless tobacco: Never Used     Comment: using 35m nicorette gum- 1/2 piece, 10 times per day  . Alcohol Use: No      Allergies  Allergen Reactions  . Metformin Diarrhea and Nausea Only  . Other Rash    Sage Wipes    Current Outpatient Prescriptions  Medication Sig Dispense Refill  . acetaminophen (TYLENOL) 325 MG tablet Take 650 mg by mouth every 6 (six) hours as needed for pain.    .Marland Kitchengabapentin (NEURONTIN) 300 MG capsule Take 300 mg by mouth as needed.    .Marland KitchenglipiZIDE (GLUCOTROL XL) 5 MG 24 hr tablet Take by mouth.    . hydrochlorothiazide (HYDRODIURIL) 25 MG tablet Take 25 mg by mouth daily.    .Marland Kitchenletrozole (FEMARA) 2.5 MG tablet Take 2.5 mg by mouth daily.    . Multiple Vitamins-Minerals (MULTIVITAMIN PO) Take by mouth.    . naproxen sodium (ANAPROX) 220 MG tablet Take 220 mg by mouth every 8 (eight) hours as needed.    . nicotine polacrilex (NICORETTE) 2 MG gum Take 2 mg by mouth 5 (five) times daily.     . simvastatin (ZOCOR) 10 MG tablet Take 10 mg by mouth daily.    . hydrochlorothiazide (HYDRODIURIL) 25 MG tablet Take 25 mg by mouth daily. Take half daily    . PARoxetine Mesylate (BRISDELLE PO) Take 1 tablet by mouth at bedtime.     No current facility-administered medications for this visit.    OBJECTIVE:  Filed Vitals:   01/30/15 0911  BP: 133/77  Pulse: 108  Temp: 96.4 F (35.8 C)     Body mass index is 27.29 kg/(m^2).    ECOG FS:0 - Asymptomatic  PHYSICAL EXAM: GENERAL:  Well developed, well nourished, sitting comfortably in the exam room in no acute  distress. MENTAL STATUS:  Alert and oriented to person, place and time. HEAD:   Normocephalic, atraumatic, face symmetric, no Cushingoid features. EYES:    Pupils equal round and reactive to light and accomodation.  No conjunctivitis or scleral icterus. ENT:  Oropharynx clear without lesion.  Tongue normal. Mucous membranes moist.  RESPIRATORY:  Clear to auscultation without rales, wheezes or rhonchi. CARDIOVASCULAR:  Regular rate and rhythm without murmur, rub or gallop. BREAST:  Right breast : Status post mastectomy.  No evidence of recurrent disease skin changes or nipple discharge.  Left breast without masses, skin changes or nipple discharge. ABDOMEN:  Soft, non-tender, with active bowel sounds, and no hepatosplenomegaly.  No masses. BACK:  No CVA tenderness.  No tenderness on percussion of the back or rib cage.  SKIN:  No rashes, ulcers or lesions. EXTREMITIES: No edema, no skin discoloration or tenderness.  No palpable cords. LYMPH NODES: No palpable cervical, supraclavicular, axillary or inguinal adenopathy  NEUROLOGICAL: Unremarkable. PSYCH:  Appropriate.   LAB RESULTS:  No visits with results within 3 Day(s) from this visit. Latest known visit with results is:  Appointment on 01/21/2015  Component Date Value Ref Range Status  . WBC 01/21/2015 12.9* 3.6 - 11.0 K/uL Final  . RBC 01/21/2015 4.05  3.80 - 5.20 MIL/uL Final  . Hemoglobin 01/21/2015 12.5  12.0 - 16.0 g/dL Final  . HCT 01/21/2015 37.4  35.0 - 47.0 % Final  . MCV 01/21/2015 92.3  80.0 - 100.0 fL Final  . MCH 01/21/2015 30.9  26.0 - 34.0 pg Final  . MCHC 01/21/2015 33.5  32.0 - 36.0 g/dL Final  . RDW 01/21/2015 13.7  11.5 - 14.5 % Final  . Platelets 01/21/2015 384  150 - 440 K/uL Final  . Neutrophils Relative % 01/21/2015 71   Final  . Neutro Abs 01/21/2015 9.1* 1.4 - 6.5 K/uL Final  . Lymphocytes Relative 01/21/2015 16   Final  . Lymphs Abs 01/21/2015 2.1  1.0 - 3.6 K/uL Final  . Monocytes Relative 01/21/2015 9    Final  . Monocytes Absolute 01/21/2015 1.2* 0.2 - 0.9 K/uL Final  . Eosinophils Relative 01/21/2015 3   Final  . Eosinophils Absolute 01/21/2015 0.4  0 - 0.7 K/uL Final  . Basophils Relative 01/21/2015 1   Final  . Basophils Absolute 01/21/2015 0.1  0 - 0.1 K/uL Final  . Sodium 01/21/2015 133* 135 - 145 mmol/L Final  . Potassium 01/21/2015 3.8  3.5 - 5.1 mmol/L Final  . Chloride 01/21/2015 97* 101 - 111 mmol/L Final  . CO2 01/21/2015 27  22 - 32 mmol/L Final  . Glucose, Bld 01/21/2015 86  65 - 99 mg/dL Final  . BUN 01/21/2015 9  6 - 20 mg/dL Final  . Creatinine, Ser 01/21/2015 0.78  0.44 - 1.00 mg/dL Final  . Calcium 01/21/2015 9.3  8.9 - 10.3 mg/dL Final  . Total Protein 01/21/2015 9.0* 6.5 - 8.1 g/dL Final  . Albumin 01/21/2015 4.2  3.5 - 5.0 g/dL Final  . AST 01/21/2015 25  15 - 41 U/L Final  . ALT 01/21/2015 24  14 - 54 U/L Final  . Alkaline Phosphatase 01/21/2015 144* 38 - 126 U/L Final  . Total Bilirubin 01/21/2015 0.5  0.3 - 1.2 mg/dL Final  . GFR calc non Af Amer 01/21/2015 >60  >60 mL/min Final  . GFR calc Af Amer 01/21/2015 >60  >60 mL/min Final   Comment: (NOTE) The eGFR has been calculated using the CKD EPI equation. This calculation has not been validated in all clinical situations. eGFR's persistently <60 mL/min signify possible Chronic Kidney Disease.   . Anion gap 01/21/2015 9  5 - 15 Final    Lab Results  Component Value Date   LABCA2 20.3 06/11/2014     STUDIES: Ct Chest W Contrast  01/24/2015   CLINICAL DATA:  Right upper lobectomy for lung cancer 2014. Restaging. Cough and chest pain for 3 months  EXAM: CT CHEST WITH CONTRAST  TECHNIQUE: Multidetector CT imaging of the chest was performed during intravenous contrast administration.  CONTRAST:  28m OMNIPAQUE IOHEXOL 300 MG/ML  SOLN  COMPARISON:  Chest CT 03/06/2013  FINDINGS: Mediastinum/Nodes: Lymphadenopathy noted as follows:  Pretracheal node, 1.3 cm, image 18, previously 0.8 cm by my measurement at a  similar  anatomic level previously.  Confluent left hilar lymphadenopathy, 2.3 cm image 24, previously 1.3 cm at similar anatomic level.  Subcarinal lymphadenopathy, now 2.0 cm image 23 compared to my previous measurement of 0.3 cm.  Evidence of at least partial right upper lobectomy.  Heart size at upper limits of normal. No pericardial effusion. Great vessels are normal in caliber.  Left Port-A-Cath in place with tip at the cavoatrial junction. Evidence of right mastectomy. Presumed postsurgical change reidentified in the central left breast.  Lungs/Pleura: Bilateral upper lobe/right upper lung zone subpleural interstitial marking prominence is identified, increased in the left since previously and new on the right. These findings could be related to previous radiation therapy. New irregular left upper lobe pleural parenchymal mass, 2.3 x 2.2 cm image 11. No measurable right upper lobe residual mass as compared to the previous finding.  New subpleural irregular pulmonary nodule, 0.7 cm, image 19. Diffuse mild prominence of the interstitial markings noted with hypoaeration that could suggest atelectasis. Central airways are patent.  Upper abdomen: Adrenal glands are unremarkable. Gallbladder decompressed but unremarkable.  Musculoskeletal: Minimal if any callus formation is identified adjacent to right lateral fourth, fifth, and sixth rib fractures with associated pleural thickening.  IMPRESSION: New irregular left upper lobe 2.3 cm mass highly suspicious for recurrent or metastatic lung or breast malignancy.  Mediastinal and hilar lymphadenopathy, also most likely metastatic. If differentiation between lung and breast malignancy is desired, these would likely be accessible at bronchoscopy.  Presumed right upper lung zone interval development of post radiation change without measurable residual mass at the site of previously reported lung cancer status post lobectomy.  New irregular 0.7 cm right upper lung zone  pleural parenchymal nodule.  Acute to subacute right lateral fourth through sixth rib fractures with associated pleural thickening. These could be pathologic related to metastatic disease or radiation treatment or posttraumatic. The patient reportedly had thoracotomy therefore these could be postsurgical but the appearance is less typical for post surgical change.   Electronically Signed   By: Conchita Paris M.D.   On: 01/24/2015 11:00    ASSESSMENT: Patient has new abnormality detected on CT scan.  Some of the changes may be related to radiation therapy however there is a recurrent mass in the left upper lobe with some mediastinal adenopathy  MEDICAL DECISION MAKING:  Repeat PET scan.  Reevaluate patient.  After PET scan is done. EGFR and alk will be evaluated  On original specimen CT scan has been reviewed independently. Proceed with PET scanning Discussed case in tumor conference Call pulmonologist ask him to review CT scan to decide whether EBUS will be useful  Patient expressed understanding and was in agreement with this plan. She also understands that She can call clinic at any time with any questions, concerns, or complaints.    Malignant neoplasm of female breast   Staging form: Breast, AJCC 7th Edition     Clinical: Stage IIIA (T3, N1, M0) - Signed by Forest Gleason, MD on 01/26/2015 Malignant neoplasm of upper lobe, bronchus or lung   Staging form: Lung, AJCC 7th Edition     Clinical: Stage IA (T1b, N0, M0) - Signed by Forest Gleason, MD on 01/26/2015   Forest Gleason, MD   01/30/2015 9:28 AM

## 2015-02-05 NOTE — Discharge Instructions (Signed)
Flexible Bronchoscopy, Care After Refer to this sheet in the next few weeks. These instructions provide you with information on caring for yourself after your procedure. Your health care provider may also give you more specific instructions. Your treatment has been planned according to current medical practices, but problems sometimes occur. Call your health care provider if you have any problems or questions after your procedure.  WHAT TO EXPECT AFTER THE PROCEDURE It is normal to have the following symptoms for 24-48 hours after the procedure:   Increased cough.  Low-grade fever.  Sore throat or hoarse voice.  Small streaks of blood in your thick spit (sputum) if tissue samples were taken (biopsy). HOME CARE INSTRUCTIONS   Do not eat or drink anything for 2 hours after your procedure. Your nose and throat were numbed by medicine. If you try to eat or drink before the medicine wears off, food or drink could go into your lungs or you could burn yourself. After the numbness is gone and your cough and gag reflexes have returned, you may eat soft food and drink liquids slowly.   The day after the procedure, you can go back to your normal diet.   You may resume normal activities.   Keep all follow-up visits as directed by your health care provider. It is important to keep all your appointments, especially if tissue samples were taken for testing (biopsy). SEEK IMMEDIATE MEDICAL CARE IF:   You have increasing shortness of breath.   You become light-headed or faint.   You have chest pain.   You have any new concerning symptoms.  You cough up more than a small amount of blood.  The amount of blood you cough up increases. MAKE SURE YOU:  Understand these instructions.  Will watch your condition.  Will get help right away if you are not doing well or get worse. Document Released: 03/06/2005 Document Revised: 01/01/2014 Document Reviewed: 04/21/2013 [facility] Patient Information  2015 [place], [place]. This information is not intended to replace advice given to you by your health care provider. Make sure you discuss any questions you have with your health care provider. AMBULATORY SURGERY  DISCHARGE INSTRUCTIONS   1) The drugs that you were given will stay in your system until tomorrow so for the next 24 hours you should not:  A) Drive an automobile B) Make any legal decisions C) Drink any alcoholic beverage   2) You may resume regular meals tomorrow.  Today it is better to start with liquids and gradually work up to solid foods.  You may eat anything you prefer, but it is better to start with liquids, then soup and crackers, and gradually work up to solid foods.   3) Please notify your doctor immediately if you have any unusual bleeding, trouble breathing, redness and pain at the surgery site, drainage, fever, or pain not relieved by medication. 4)                    5) Additional Instructions: Hospital Main Number [phone] 3)

## 2015-02-05 NOTE — Transfer of Care (Signed)
Immediate Anesthesia Transfer of Care Note  Patient: [name]  Procedure(s) Performed: Procedure(s): ENDOBRONCHIAL ULTRASOUND (N/A)  Patient Location: PACU  Anesthesia Type:General  Level of Consciousness: awake and alert   Airway & Oxygen Therapy: Patient Spontanous Breathing and Patient connected to nasal cannula oxygen  Post-op Assessment: Report given to RN and Post -op Vital signs reviewed and stable  Post vital signs: Reviewed and stable  Last Vitals:  Filed Vitals:   02/05/15 1150  BP: 95/62  Pulse:   Temp: 37 C  Resp:     Complications: No apparent anesthesia complications

## 2015-02-08 LAB — CYTOLOGY - NON PAP

## 2015-02-14 ENCOUNTER — Other Ambulatory Visit: Payer: Self-pay

## 2015-02-14 DIAGNOSIS — Z853 Personal history of malignant neoplasm of breast: Secondary | ICD-10-CM

## 2015-02-18 ENCOUNTER — Other Ambulatory Visit: Payer: Self-pay

## 2015-02-18 NOTE — Patient Outreach (Signed)
[facility]) Care Management  02/18/2015  Brittney Lam October 14, 1960 [id number]  I spoke with Brittney Lam by phone today.  As a follow up to our last visit when [facility] had a cough, Dr. Jeb Levering sent Brittney Lam for a CT scan of her lungs- he found a spot on the left lung.  She had a PET scan and there were 5 tumors found, 3 on the right lung and 2 on the left.  MD feels the tumors may have metastasized from the breast cancer.  She has had a biopsy so she will see the MD tomorrow to discuss.    She continues to check blood sugars; around 100 mg/dl in the morning and around '85mg'$ /dl after meals.  She continues in the [employer] program through work and has lost 11 lbs.   She has decreased her fat intake to 60 gm/day. She is unable to exercise because of the SOB.  No low blood sugars.    [name], RN, BA, [place], [place] Direct Dial:  [phone]  Fax:  [phone] E-mail: [email or web address] [address], [place], [place]  [address]

## 2015-02-19 ENCOUNTER — Inpatient Hospital Stay: Payer: 59 | Admitting: Oncology

## 2015-02-20 ENCOUNTER — Inpatient Hospital Stay (HOSPITAL_BASED_OUTPATIENT_CLINIC_OR_DEPARTMENT_OTHER): Payer: 59 | Admitting: Oncology

## 2015-02-20 DIAGNOSIS — Z170 Estrogen receptor positive status [ER+]: Secondary | ICD-10-CM

## 2015-02-20 DIAGNOSIS — C3490 Malignant neoplasm of unspecified part of unspecified bronchus or lung: Secondary | ICD-10-CM | POA: Diagnosis not present

## 2015-02-20 DIAGNOSIS — Z79899 Other long term (current) drug therapy: Secondary | ICD-10-CM

## 2015-02-20 DIAGNOSIS — Z853 Personal history of malignant neoplasm of breast: Secondary | ICD-10-CM

## 2015-02-20 DIAGNOSIS — C50911 Malignant neoplasm of unspecified site of right female breast: Secondary | ICD-10-CM

## 2015-02-20 DIAGNOSIS — Z79811 Long term (current) use of aromatase inhibitors: Secondary | ICD-10-CM

## 2015-02-20 NOTE — Progress Notes (Signed)
Patient does not have living will.  Former smoker. 

## 2015-02-26 ENCOUNTER — Ambulatory Visit
Admission: RE | Admit: 2015-02-26 | Discharge: 2015-02-26 | Disposition: A | Discharge status: Home / Self Care | Payer: 59 | Source: Ambulatory Visit | Attending: Radiation Oncology | Admitting: Radiation Oncology

## 2015-02-26 ENCOUNTER — Other Ambulatory Visit: Payer: Self-pay | Admitting: *Deleted

## 2015-02-26 ENCOUNTER — Encounter: Payer: Self-pay | Admitting: Radiation Oncology

## 2015-02-26 VITALS — BP 115/68 | HR 91 | Temp 96.5°F | Resp 20 | Ht 62.0 in | Wt 144.4 lb

## 2015-02-26 DIAGNOSIS — C3490 Malignant neoplasm of unspecified part of unspecified bronchus or lung: Secondary | ICD-10-CM

## 2015-02-26 DIAGNOSIS — C3411 Malignant neoplasm of upper lobe, right bronchus or lung: Secondary | ICD-10-CM | POA: Insufficient documentation

## 2015-02-26 DIAGNOSIS — Z510 Encounter for antineoplastic radiation therapy: Secondary | ICD-10-CM | POA: Insufficient documentation

## 2015-02-26 DIAGNOSIS — C3410 Malignant neoplasm of upper lobe, unspecified bronchus or lung: Secondary | ICD-10-CM

## 2015-02-26 NOTE — Consult Note (Signed)
Radiation Oncology NEW PATIENT EVALUATION  Name: Brittney Lam  MRN: [id number]  Date:   02/26/2015     DOB: 05-25-1961   This 54 y.o. female patient presents to the clinic for initial evaluation of stage IIIB lung cancer (T3 N3 M0). Adenocarcinoma with a history of bilateral breast cancer with previous treatment to bilateral chest walls  REFERRING PHYSICIAN: [name], MD  CHIEF COMPLAINT:  Chief Complaint  Patient presents with  . Lung Cancer    Pt is OPNA here for consultation for lung cancer.      DIAGNOSIS: The encounter diagnosis was Malignant neoplasm of lung, unspecified laterality, unspecified part of lung.   PREVIOUS INVESTIGATIONS:  PET CT and CT scans reviewed Surgical cytology reports reviewed Clinical notes reviewed  HPI: Patient is a 54 year old female well known to our department having received radiation therapy to her left breast in 2005 and again right chest wall and peripheral lymphatics in 1 year prior free T3 N1 invasive mammary carcinoma status post right modified radical mastectomy for an 8 cm lesion with for sentinel lymph nodes positive for metastatic disease. She had new adjuvant chemotherapy chest wall radiation. Patient also has a history of right upper lobe lung cancer status post right upper lobectomy for T1 BN 0 lesion. Recently presented with some vague pulmonary complaints CT scan showed left upper lobe mass measuring 2.7 cm suspicious for primary bronchogenic malignancy. She also multifocal thoracic nodal metastasis crossing the midline mediastinum. She underwent endobronchial ultrasound which was positive for adenocarcinoma thought to be metastatic although tumor markers did not 0.2 primary breast cancer. She's been seen by medical oncology was presented at our weekly tumor conference staged a IIIB based on contralateral mediastinal nodal involvement and recommendation was made for combined modality chemoradiation. She is seen today for that  opinion. She specifically denies hemoptysis has a mild cough no productive cough. She's also been having some pressure behind her right eye and some headaches. Has not had any imaging of her brain at this point.  PLANNED TREATMENT REGIMEN: Concurrent chemotherapy and radiation. Would favor I am RT treatment planning delivery based on previous radiation to her bilateral chest walls  PAST MEDICAL HISTORY:  has a past medical history of Unspecified essential hypertension; Kidney failure; Personal history of tobacco use, presenting hazards to health; Cataract; Personal history of malignant neoplasm of breast; Breast screening, unspecified; Special screening for malignant neoplasms, colon; Cancer (2001); Cancer (2014); Lung cancer, upper lobe (2014); Diabetes mellitus without complication; MDRO (multiple drug resistant organisms) resistance; Breast cancer; H. pylori duodenitis; and Complication of anesthesia.    PAST SURGICAL HISTORY:  Past Surgical History  Procedure Laterality Date  . Intercostal nerve block  2005  . Cataract extraction w/ intraocular lens implant  2005  . Carpal tunnel release Right 2011  . Insertion central venous access device w/ subcutaneous port  12-07-12  . Breast lumpectomy Left 2001  . Breast surgery Right 2014    total mastectomy  . Right lung upper lobectomy  03/2013  . Lobectomy Right   . Cataract extraction      FAMILY HISTORY: family history includes Diabetes in her other; Hyperlipidemia in her other; Hypertension in her other.  SOCIAL HISTORY:  reports that she quit smoking about 2 years ago. Her smoking use included Cigarettes. She has a 15 pack-year smoking history. She has never used smokeless tobacco. She reports that she does not drink alcohol or use illicit drugs.  ALLERGIES: Metformin and Other  MEDICATIONS:  Current  Outpatient Prescriptions  Medication Sig Dispense Refill  . acetaminophen (TYLENOL) 325 MG tablet Take 650 mg by mouth every 6 (six) hours  as needed for pain.    Marland Kitchen gabapentin (NEURONTIN) 300 MG capsule Take 300 mg by mouth as needed.    Marland Kitchen glipiZIDE (GLUCOTROL XL) 5 MG 24 hr tablet Take by mouth.    . hydrochlorothiazide (HYDRODIURIL) 25 MG tablet Take 12.5 mg by mouth daily.     Marland Kitchen letrozole (FEMARA) 2.5 MG tablet Take 2.5 mg by mouth daily.    . Multiple Vitamins-Minerals (MULTIVITAMIN PO) Take by mouth.    . naproxen sodium (ANAPROX) 220 MG tablet Take 220 mg by mouth every 8 (eight) hours as needed.    . nicotine polacrilex (NICORETTE) 2 MG gum Take 2 mg by mouth 5 (five) times daily.     . simvastatin (ZOCOR) 10 MG tablet Take 10 mg by mouth daily.    Marland Kitchen PARoxetine Mesylate (BRISDELLE PO) Take 1 tablet by mouth at bedtime.     No current facility-administered medications for this encounter.   Facility-Administered Medications Ordered in Other Encounters  Medication Dose Route Frequency Provider Last Rate Last Dose  . sodium chloride 0.9 % injection 10 mL  10 mL Intracatheter PRN Forest Gleason, MD   10 mL at 02/04/15 1027    ECOG PERFORMANCE STATUS:  0 - Asymptomatic  REVIEW OF SYSTEMS: Except for the headaches and pressure behind her right eye and occasional cough and mild dyspnea on exertion Patient denies any weight loss, fatigue, weakness, fever, chills or night sweats. Patient denies any loss of vision, blurred vision. Patient denies any ringing  of the ears or hearing loss. No irregular heartbeat. Patient denies heart murmur or history of fainting. Patient denies any chest pain or pain radiating to her upper extremities. Patient denies any shortness of breath, difficulty breathing at night, cough or hemoptysis. Patient denies any swelling in the lower legs. Patient denies any nausea vomiting, vomiting of blood, or coffee ground material in the vomitus. Patient denies any stomach pain. Patient states has had normal bowel movements no significant constipation or diarrhea. Patient denies any dysuria, hematuria or significant  nocturia. Patient denies any problems walking, swelling in the joints or loss of balance. Patient denies any skin changes, loss of hair or loss of weight. Patient denies any excessive worrying or anxiety or significant depression. Patient denies any problems with insomnia. Patient denies excessive thirst, polyuria, polydipsia. Patient denies any swollen glands, patient denies easy bruising or easy bleeding. Patient denies any recent infections, allergies or URI. Patient "s visual fields have not changed significantly in recent time.    PHYSICAL EXAM: BP 115/68 mmHg  Pulse 91  Temp(Src) 96.5 F (35.8 C)  Resp 20  Ht '5\' 2"'$  (1.575 m)  Wt 144 lb 6.4 oz (65.5 kg)  BMI 26.40 kg/m2 Well-developed female in NAD. No cervical or supra clavicular adenopathy is appreciated. No evidence of chest wall mass or nodularity is noted. Well-developed well-nourished patient in NAD. HEENT reveals PERLA, EOMI, discs not visualized.  Oral cavity is clear. No oral mucosal lesions are identified. Neck is clear without evidence of cervical or supraclavicular adenopathy. Lungs are clear to A&P. Cardiac examination is essentially unremarkable with regular rate and rhythm without murmur rub or thrill. Abdomen is benign with no organomegaly or masses noted. Motor sensory and DTR levels are equal and symmetric in the upper and lower extremities. Cranial nerves II through XII are grossly intact. Proprioception is intact. No  peripheral adenopathy or edema is identified. No motor or sensory levels are noted. Crude visual fields are within normal range.   LABORATORY DATA:  Cytology reports from endobronchial ultrasound reviewed   RADIOLOGY RESULTS: CT scans and PET/CT scans reviewed brain imaging has been ordered IMPRESSION: At least stage IIIB adenocarcinoma of the lung in patient with known history of bilateral breast cancer for concurrent chemotherapy and radiation.  PLAN: I discussed the case personally with medical  oncology. Would favor combined modality regimen for lung cancer. Would plan on delivering 6000 cGy to her primary tumor. Would favor a split course fashion treatment based on the extensive nature of her disease. Would treat up to 4000 cGy and evaluate for response after a one-week break. Risks and benefits of treatment including increasing dysphagia cough fatigue alteration of blood counts skin reaction destruction of normal lung volume all were discussed in detail with the patient. She seems to comprehend my treatment plan well. I would favor I am RT treatment planning and delivery based on prior radiation to bilateral chest walls from previous bilateral breast radiation. I believe I can contour the dose using I am RT treatment planning and delivery to spare the skin is much as possible and any further side effects. My treatment plan was reviewed with the patient and she seems to comprehend my treatment plan well. I have set up and ordered CT simulation later this week. We'll coordinate her chemotherapy with medical oncology. Also brain imaging will be performed based on her soft neurologic findings of headaches and pressure behind her right eye to rule out possibility of brain involvement.  I would like to take this opportunity for allowing me to participate in the care of your patient.Armstead Peaks., MD

## 2015-02-27 ENCOUNTER — Ambulatory Visit
Admission: RE | Admit: 2015-02-27 | Discharge: 2015-02-27 | Disposition: A | Discharge status: Home / Self Care | Payer: 59 | Source: Ambulatory Visit | Attending: Radiation Oncology | Admitting: Radiation Oncology

## 2015-02-27 DIAGNOSIS — Z510 Encounter for antineoplastic radiation therapy: Secondary | ICD-10-CM | POA: Diagnosis present

## 2015-02-27 DIAGNOSIS — C3411 Malignant neoplasm of upper lobe, right bronchus or lung: Secondary | ICD-10-CM | POA: Diagnosis not present

## 2015-02-28 ENCOUNTER — Ambulatory Visit
Admission: RE | Admit: 2015-02-28 | Discharge: 2015-02-28 | Disposition: A | Discharge status: Home / Self Care | Payer: 59 | Source: Ambulatory Visit | Attending: Oncology | Admitting: Oncology

## 2015-02-28 DIAGNOSIS — Z853 Personal history of malignant neoplasm of breast: Secondary | ICD-10-CM | POA: Insufficient documentation

## 2015-02-28 DIAGNOSIS — C3410 Malignant neoplasm of upper lobe, unspecified bronchus or lung: Secondary | ICD-10-CM | POA: Insufficient documentation

## 2015-02-28 DIAGNOSIS — R51 Headache: Secondary | ICD-10-CM | POA: Diagnosis present

## 2015-02-28 MED ORDER — GADOBENATE DIMEGLUMINE 529 MG/ML IV SOLN
15.0000 mL | Freq: Once | INTRAVENOUS | Status: AC | PRN
  Administered 2015-02-28: 13 mL via INTRAVENOUS

## 2015-03-05 ENCOUNTER — Encounter: Payer: Self-pay | Admitting: Oncology

## 2015-03-05 DIAGNOSIS — C3490 Malignant neoplasm of unspecified part of unspecified bronchus or lung: Secondary | ICD-10-CM | POA: Insufficient documentation

## 2015-03-05 NOTE — Progress Notes (Signed)
[name] @ [facility] [phone]  Fax:([phone]) [place]: 08/21/1961  MR#: [id number]  KVQ#:[id number]  Patient Care Team: [name], MD as PCP - General (Family Medicine) Seeplaputhur Robinette Haines, MD (General Surgery)  CHIEF COMPLAINT:  Chief Complaint  Patient presents with  . Follow-up    Oncology History   93. 54 year old female status post recent left breast cancer in 2005  2. Right breast cancer, locally advanced stage IIIa (pyT3, N1, M0), invasive mammary carcinoma with mucin production. Status post right modified radical mastectomy for 8 cm lesion with one of 4 sentinel lymph nodes positive for metastatic disease. Tumor is ER positive PR negative HER-2/neu not overexpressed. 3. Completed neoadjuvant chemotherapy with chest wall radiation therapy,  Finished on October 21, 2013. 4. Started letrazole 2.5 mg by mouth daily from October 24, 2013. 5. Carcinoma of lung.  Right upper lobe, status post right upper lobectomy, [id number]. 6.  Left upper lobe mass with mediastinal lymph node biopsies positive for poorly differentiated adenocarcinoma T1 N1 M0 tumor III Starting chemoradiation therapy in July of 2016 (second primary) Patient had EUS in June of 2016.  Bilateral hilar adenopathy which has been biopsied was positive for metastatic adenocarcinoma consistent with lung primary     Malignant neoplasm of upper lobe, bronchus or lung   04/03/2013 Initial Diagnosis Malignant neoplasm of upper lobe, bronchus or lung    Malignant neoplasm of female breast   04/03/2013 Initial Diagnosis Malignant neoplasm of female breast   54year-old lady with second primary left upper lobe lung cancer with mediastinal lymph node.  Bilateral lymph nodes were positive for metastatic adenocarcinoma.  Patient had left upper lobe lung mass.  Here for further follow-up and treatment consideration. Patient also has occasional headaches with MRI scan of head has been ordered and  will be reviewed.  Patient is also being evaluated by Dr. Donella Stade   INTERVAL HISTORY: Patient is here with most likely appears to be having second lung cancer on the left upper lobe primary disease with contralateral lymphadenopathy both positive metastases adenocarcinoma by EUS.  Here to discuss the results and further planning of treatment.  Pathology has been reviewed and case was discussed in tumor conference this appears to be lung primary rather than metastases to from breast cancer. Case was also discussed with radiation oncologist today. MRI scan of brain has been ordered. REVIEW OF SYSTEMS:   GENERAL:  Feels good.  Active.  No fevers, sweats or weight loss. PERFORMANCE STATUS (ECOG):0 HEENT:  No visual changes, runny nose, sore throat, mouth sores or tenderness. Lungs: No shortness of breath or cough.  No hemoptysis. Cardiac:  No chest pain, palpitations, orthopnea, or PND. GI:  No nausea, vomiting, diarrhea, constipation, melena or hematochezia. GU:  No urgency, frequency, dysuria, or hematuria. Musculoskeletal:  No back pain.  No joint pain.  No muscle tenderness. Extremities:  No pain or swelling. Skin:  No rashes or skin changes. Neuro:  No  numbness or weakness, balance or coordination issues.  Recent has occasional headache Endocrine:  No diabetes, thyroid issues, hot flashes or night sweats. Psych:  No mood changes, depression or anxiety. Pain:  No focal pain. Review of systems:  All other systems reviewed and found to be negative.  As per HPI. Otherwise, a complete review of systems is negatve.  PAST MEDICAL HISTORY: Past Medical History  Diagnosis Date  . Unspecified essential hypertension   . Kidney failure   . Personal history of tobacco  use, presenting hazards to health   . Cataract   . Personal history of malignant neoplasm of breast   . Breast screening, unspecified   . Special screening for malignant neoplasms, colon   . Cancer 2001    left breast cancer  s/p L/SN/R in 2001  . Cancer 2014    right breast invasive CA, Er pos,Pr neg, Her 2 neg.  . Lung cancer, upper lobe 2014    right upper lobe  . Diabetes mellitus without complication   . MDRO (multiple drug resistant organisms) resistance   . Breast cancer   . H. pylori duodenitis   . Complication of anesthesia     BP DROPPED DURING LOBECTOMY IN 2014    PAST SURGICAL HISTORY: Past Surgical History  Procedure Laterality Date  . Intercostal nerve block  2005  . Cataract extraction w/ intraocular lens implant  2005  . Carpal tunnel release Right 2011  . Insertion central venous access device w/ subcutaneous port  12-07-12  . Breast lumpectomy Left 2001  . Breast surgery Right 2014    total mastectomy  . Right lung upper lobectomy  03/2013  . Lobectomy Right   . Cataract extraction      FAMILY HISTORY Family History  Problem Relation Age of Onset  . Diabetes Other   . Hyperlipidemia Other   . Hypertension Other     GYNECOLOGIC HISTORY:  No LMP recorded. Patient is postmenopausal.     ADVANCED DIRECTIVES: Patient does not have any advanced healthcare directive. Information has been given.   HEALTH MAINTENANCE: History  Substance Use Topics  . Smoking status: Former Smoker -- 0.50 packs/day for 30 years    Types: Cigarettes    Quit date: 02/03/2013  . Smokeless tobacco: Never Used     Comment: using 7m nicorette gum- 1/2 piece, 10 times per day  . Alcohol Use: No      Allergies  Allergen Reactions  . Metformin Diarrhea and Nausea Only  . Other Rash    Sage Wipes    Current Outpatient Prescriptions  Medication Sig Dispense Refill  . acetaminophen (TYLENOL) 325 MG tablet Take 650 mg by mouth every 6 (six) hours as needed for pain.    .Marland Kitchengabapentin (NEURONTIN) 300 MG capsule Take 300 mg by mouth as needed.    .Marland KitchenglipiZIDE (GLUCOTROL XL) 5 MG 24 hr tablet Take by mouth.    . letrozole (FEMARA) 2.5 MG tablet Take 2.5 mg by mouth daily.    . Multiple  Vitamins-Minerals (MULTIVITAMIN PO) Take by mouth.    . naproxen sodium (ANAPROX) 220 MG tablet Take 220 mg by mouth every 8 (eight) hours as needed.    . nicotine polacrilex (NICORETTE) 2 MG gum Take 2 mg by mouth 5 (five) times daily.     . simvastatin (ZOCOR) 10 MG tablet Take 10 mg by mouth daily.    . hydrochlorothiazide (HYDRODIURIL) 25 MG tablet Take 12.5 mg by mouth daily.     .Marland KitchenPARoxetine Mesylate (BRISDELLE PO) Take 1 tablet by mouth at bedtime.     No current facility-administered medications for this visit.   Facility-Administered Medications Ordered in Other Visits  Medication Dose Route Frequency Provider Last Rate Last Dose  . sodium chloride 0.9 % injection 10 mL  10 mL Intracatheter PRN JForest Gleason MD   10 mL at 02/04/15 1027    OBJECTIVE:  Filed Vitals:   02/20/15 1148  BP: 124/79  Pulse: 96  Temp: 98.3 F (36.8 C)  Body mass index is 26.24 kg/(m^2).    ECOG FS:0 - Asymptomatic  PHYSICAL EXAM: GENERAL:  Well developed, well nourished, sitting comfortably in the exam room in no acute distress. MENTAL STATUS:  Alert and oriented to person, place and time. HEAD:   Normocephalic, atraumatic, face symmetric, no Cushingoid features. EYES:    Pupils equal round and reactive to light and accomodation.  No conjunctivitis or scleral icterus. ENT:  Oropharynx clear without lesion.  Tongue normal. Mucous membranes moist.  RESPIRATORY:  Clear to auscultation without rales, wheezes or rhonchi. CARDIOVASCULAR:  Regular rate and rhythm without murmur, rub or gallop. BREAST:  Right breast : Status post mastectomy.  No evidence of recurrent disease skin changes or nipple discharge.  Left breast without masses, skin changes or nipple discharge. ABDOMEN:  Soft, non-tender, with active bowel sounds, and no hepatosplenomegaly.  No masses. BACK:  No CVA tenderness.  No tenderness on percussion of the back or rib cage. SKIN:  No rashes, ulcers or lesions. EXTREMITIES: No edema, no  skin discoloration or tenderness.  No palpable cords. LYMPH NODES: No palpable cervical, supraclavicular, axillary or inguinal adenopathy  NEUROLOGICAL: Unremarkable. PSYCH:  Appropriate.   LAB RESULTS:  No visits with results within 3 Day(s) from this visit. Latest known visit with results is:  Admission on 02/05/2015, Discharged on 02/05/2015  Component Date Value Ref Range Status  . Glucose-Capillary 02/05/2015 108* 65 - 99 mg/dL Final  . CYTOLOGY - NON GYN 02/05/2015    Final-Edited                   Value:Cytology - Non PAP CASE: [id number] PATIENT: Brittney Lam Non-Gyn Cytology Report     SPECIMEN SUBMITTED: A. FNA, right, paratracheal mass  CLINICAL HISTORY: H/o breast cancer and lung cancer  PRE-OPERATIVE DIAGNOSIS:   POST-OPERATIVE DIAGNOSIS: None Provided     DIAGNOSIS: A. RIGHT PARATRACHEAL MASS; ENDOBRONCHIAL ULTRASOUND-GUIDED FINE NEEDLE ASPIRATE: - METASTATIC ADENOCARCINOMA.  Comment: There are no specific morphologic features in this sample to suggest a primary site. Immunohistochemistry was performed, and in this sample the neoplastic cells are negative for estrogen receptor, GATA3, Napsin A, TTF-1, and p40. The previous history was reviewed in detail. The absence of estrogen receptor and GATA3 expression indicates that metastatic breast carcinoma is unlikely.  There is insufficient tumor tissue in the cell blocks for ancillary molecular testing, but there is probably enough material for additional immunohistochemistry if needed.  G                         ROSS DESCRIPTION: Procedure:  EBUS Cytotechnologist: Rivka Barbara and Georgina Snell Site: right paratracheal FNA  Material submitted: 3 # Diff quick stained slides 3 # Pap stained slides 1 Needle rinse in Cytolyt for ThinPrep   Gross Description: Specimen Labeled: right paratracheal Volume: 40 mL Description: red CytoLyt solution with a 2.5 x 1.0 x 0.1 cm aggregate of red  material Cell blocks: 1-2  Correction performed by Bryan Lemma, MD.  Electronically signed 02/08/2015 4:01:29PM   Final Diagnosis performed by Bryan Lemma, MD.  Electronically signed 02/08/2015 3:55:31PM   Technical component performed at [place], [address] [place], [place] [address] Lab: [phone] Dir: Darrick Penna. Evette Doffing, MD  Professional component performed at [facility], [facility], [place], [place], [place] [address] Lab: [phone] Dir: Dellia Nims. Reuel Derby, MD  Technical component performed at [place], [address] [place], [place] [address] Lab: [phone]-  [place]: Darrick Penna. Evette Doffing, MD  Professional component performed at [facility], [facility], [place], [place], [place] [address] Lab: [phone] Dir: Dellia Nims. Rubinas, MD    . CYTOLOGY - NON GYN 02/05/2015    Final                   Value:Cytology - Non PAP CASE: [id number] PATIENT: Lemon Renda Non-Gyn Cytology Report     SPECIMEN SUBMITTED: A. FNA, left hilar mass  CLINICAL HISTORY: H/o breast and lung cancer  PRE-OPERATIVE DIAGNOSIS:   POST-OPERATIVE DIAGNOSIS: None Provided     DIAGNOSIS: A. LEFT HILAR MASS; ENDOBRONCHIAL ULTRASOUND-GUIDED FINE NEEDLE ASPIRATE: - METASTATIC ADENOCARCINOMA.  Comment: There are no specific morphologic features in this sample to suggest a primary site. The sample is quite scant, and morphologic comparison with the right paratracheal EBUS FNA ([id number]) is difficult, but they appear generally similar. The right paratracheal sample is more suitable for ancillary testing, and the absence of estrogen receptor and GATA3 expression in that sample indicates that metastatic breast carcinoma is unlikely.  There is insufficient material in this specimen for ancillary molecular testing.  GROSS DESCRIPTION: Procedure:  EBUS Cytotechnologist: Romie Jumper                         a Bertell Maria  and [employer] Site: Left hilar FNA No immediate assessment performed  Material submitted: 1 # Diff quick stained slides 1 # Pap stained slides 1 Needle rinse in Cytolyt for Thin Prep  Gross Description: Specimen Labeled: left hilar Volume: 50 mL Description: red CytoLyt solution with a 2.5 x 2.0 x 0.1 cm aggregate of wispy pink tan tissue Cell block(s): 1-3   Final Diagnosis performed by Bryan Lemma, MD.  Electronically signed 02/08/2015 3:57:54PM    The electronic signature indicates that the named Attending Pathologist has evaluated the specimen  Technical component performed at [place], [address] [place], [place] [address] Lab: [phone] Dir: Darrick Penna. Evette Doffing, MD  Professional component performed at [facility], [facility], [place], [place], [place] [address] Lab: [phone] Dir: Dellia Nims. Reuel Derby, MD      Lab Results  Component Value Date   LABCA2 20.3 06/11/2014     STUDIES: Mr Jeri Cos ZS Contrast  Mar 18, 2015   CLINICAL DATA:  Recently diagnosed stage III lung cancer. Personal history of breast cancer.  EXAM: MRI HEAD WITHOUT AND WITH CONTRAST  TECHNIQUE: Multiplanar, multiecho pulse sequences of the brain and surrounding structures were obtained without and with intravenous contrast.  CONTRAST:  67m MULTIHANCE GADOBENATE DIMEGLUMINE 529 MG/ML IV SOLN  COMPARISON:  None.  FINDINGS: There is no evidence of acute infarct, intracranial hemorrhage, mass, midline shift, or extra-axial fluid collection. Ventricles and sulci are normal. There are a few punctate foci of T2 hyperintensity in the subcortical cerebral white matter, nonspecific and not greater than expected for patient's age. No abnormal enhancement is identified.  Prior bilateral cataract extraction is noted. Mild bilateral ethmoid and left maxillary sinus mucosal thickening is noted. No significant mastoid effusion. Major intracranial vascular flow voids are preserved.   IMPRESSION: Unremarkable appearance of the brain for age. No evidence of intracranial metastatic disease.   Electronically Signed   By: ALogan Bores  On: 02016-02-1813:26    ASSESSMENT: 1 left upper lobe lung mass with mediastinal lymph node along with contralateral hilar lymphadenopathy both positive for metastatic adenocarcinoma T1 N3 M0 tumor stage IIIB 2.  Previous  history of right upper lobe lung cancer status post resection T1 N0 M0 tumor 3.  History of carcinoma breast.  Right breast cancer status post chemoradiation therapy on anti-hormonal therapy. Previous history of left breast cancer  MEDICAL DECISION MAKING:   Pathology report has been reviewed.   MRI scan of brain was ordered because of headache and has been reviewed reported to be negative for any metastases  Case was discussed in tumor conference as well as discussed with Dr. Donella Stade  Possibility of treating these as second lung primary with stage IIIB disease  Starting radiation and chemotherapy with carboplatinum and Taxol  Patient expressed understanding and was in agreement with this plan. She also understands that She can call clinic at any time with any questions, concerns, or complaints.  Patient was explained all the side effects of chemotherapy.  And informed consent has been obtained   Malignant neoplasm of female breast   Staging form: Breast, AJCC 7th Edition     Clinical: Stage IIIA (T3, N1, M0) - Signed by Forest Gleason, MD on 01/26/2015 Malignant neoplasm of upper lobe, bronchus or lung   Staging form: Lung, AJCC 7th Edition     Clinical: Stage IA (T1b, N0, M0) - Signed by Forest Gleason, MD on 01/26/2015   Forest Gleason, MD   03/05/2015 1:05 PM

## 2015-03-06 ENCOUNTER — Inpatient Hospital Stay (HOSPITAL_BASED_OUTPATIENT_CLINIC_OR_DEPARTMENT_OTHER): Payer: 59 | Admitting: Oncology

## 2015-03-06 ENCOUNTER — Inpatient Hospital Stay: Payer: 59 | Attending: Oncology

## 2015-03-06 ENCOUNTER — Inpatient Hospital Stay: Payer: 59

## 2015-03-06 ENCOUNTER — Encounter: Payer: Self-pay | Admitting: Oncology

## 2015-03-06 ENCOUNTER — Telehealth: Payer: Self-pay | Admitting: *Deleted

## 2015-03-06 VITALS — BP 115/73 | HR 93 | Temp 96.5°F | Wt 143.5 lb

## 2015-03-06 VITALS — BP 96/55 | HR 70 | Resp 20

## 2015-03-06 DIAGNOSIS — Z79899 Other long term (current) drug therapy: Secondary | ICD-10-CM

## 2015-03-06 DIAGNOSIS — I10 Essential (primary) hypertension: Secondary | ICD-10-CM | POA: Diagnosis not present

## 2015-03-06 DIAGNOSIS — Z170 Estrogen receptor positive status [ER+]: Secondary | ICD-10-CM | POA: Insufficient documentation

## 2015-03-06 DIAGNOSIS — Z9011 Acquired absence of right breast and nipple: Secondary | ICD-10-CM | POA: Insufficient documentation

## 2015-03-06 DIAGNOSIS — C3490 Malignant neoplasm of unspecified part of unspecified bronchus or lung: Secondary | ICD-10-CM

## 2015-03-06 DIAGNOSIS — C50911 Malignant neoplasm of unspecified site of right female breast: Secondary | ICD-10-CM | POA: Insufficient documentation

## 2015-03-06 DIAGNOSIS — C3412 Malignant neoplasm of upper lobe, left bronchus or lung: Secondary | ICD-10-CM

## 2015-03-06 DIAGNOSIS — Z853 Personal history of malignant neoplasm of breast: Secondary | ICD-10-CM

## 2015-03-06 DIAGNOSIS — C779 Secondary and unspecified malignant neoplasm of lymph node, unspecified: Secondary | ICD-10-CM | POA: Diagnosis not present

## 2015-03-06 DIAGNOSIS — E119 Type 2 diabetes mellitus without complications: Secondary | ICD-10-CM | POA: Insufficient documentation

## 2015-03-06 DIAGNOSIS — Z5111 Encounter for antineoplastic chemotherapy: Secondary | ICD-10-CM | POA: Insufficient documentation

## 2015-03-06 LAB — CBC WITH DIFFERENTIAL/PLATELET
BASOS ABS: 0.1 10*3/uL (ref 0–0.1)
BASOS PCT: 1 %
EOS PCT: 2 %
Eosinophils Absolute: 0.2 10*3/uL (ref 0–0.7)
HEMATOCRIT: 31.2 % — AB (ref 35.0–47.0)
Hemoglobin: 10.5 g/dL — ABNORMAL LOW (ref 12.0–16.0)
Lymphocytes Relative: 11 %
Lymphs Abs: 1 10*3/uL (ref 1.0–3.6)
MCH: 30.5 pg (ref 26.0–34.0)
MCHC: 33.6 g/dL (ref 32.0–36.0)
MCV: 90.9 fL (ref 80.0–100.0)
MONO ABS: 0.8 10*3/uL (ref 0.2–0.9)
Monocytes Relative: 9 %
NEUTROS ABS: 7.2 10*3/uL — AB (ref 1.4–6.5)
Neutrophils Relative %: 77 %
Platelets: 404 10*3/uL (ref 150–440)
RBC: 3.43 MIL/uL — AB (ref 3.80–5.20)
RDW: 14.2 % (ref 11.5–14.5)
WBC: 9.3 10*3/uL (ref 3.6–11.0)

## 2015-03-06 LAB — COMPREHENSIVE METABOLIC PANEL
ALBUMIN: 3.2 g/dL — AB (ref 3.5–5.0)
ALK PHOS: 116 U/L (ref 38–126)
ALT: 14 U/L (ref 14–54)
AST: 30 U/L (ref 15–41)
Anion gap: 12 (ref 5–15)
BUN: 10 mg/dL (ref 6–20)
CO2: 27 mmol/L (ref 22–32)
Calcium: 8.7 mg/dL — ABNORMAL LOW (ref 8.9–10.3)
Chloride: 98 mmol/L — ABNORMAL LOW (ref 101–111)
Creatinine, Ser: 0.98 mg/dL (ref 0.44–1.00)
GFR calc Af Amer: >60 mL/min (ref >60)
GFR calc non Af Amer: >60 mL/min (ref >60)
Glucose, Bld: 250 mg/dL — ABNORMAL HIGH (ref 65–99)
POTASSIUM: 2.8 mmol/L — AB (ref 3.5–5.1)
SODIUM: 137 mmol/L (ref 135–145)
Total Bilirubin: 0.4 mg/dL (ref 0.3–1.2)
Total Protein: 7.5 g/dL (ref 6.5–8.1)

## 2015-03-06 LAB — MAGNESIUM: MAGNESIUM: 2 mg/dL (ref 1.7–2.4)

## 2015-03-06 MED ORDER — HEPARIN SOD (PORK) LOCK FLUSH 100 UNIT/ML IV SOLN
500.0000 [IU] | Freq: Once | INTRAVENOUS | Status: AC | PRN
  Administered 2015-03-06: 500 [IU]
  Filled 2015-03-06: qty 5

## 2015-03-06 MED ORDER — SODIUM CHLORIDE 0.9 % IV SOLN: 40.0000 meq | Freq: Once | INTRAVENOUS | Status: DC

## 2015-03-06 MED ORDER — SODIUM CHLORIDE 0.9 % IV SOLN
Freq: Once | INTRAVENOUS | Status: AC
  Administered 2015-03-06: 14:00:00 via INTRAVENOUS
  Filled 2015-03-06: qty 8

## 2015-03-06 MED ORDER — DIPHENHYDRAMINE HCL 50 MG/ML IJ SOLN
25.0000 mg | Freq: Once | INTRAMUSCULAR | Status: AC
  Administered 2015-03-06: 25 mg via INTRAVENOUS
  Filled 2015-03-06: qty 1

## 2015-03-06 MED ORDER — SODIUM CHLORIDE 0.9 % IJ SOLN
10.0000 mL | INTRAMUSCULAR | Status: DC | PRN
  Filled 2015-03-06: qty 10

## 2015-03-06 MED ORDER — PACLITAXEL CHEMO INJECTION 300 MG/50ML
45.0000 mg/m2 | Freq: Once | INTRAVENOUS | Status: AC
  Administered 2015-03-06: 78 mg via INTRAVENOUS
  Filled 2015-03-06: qty 13

## 2015-03-06 MED ORDER — FAMOTIDINE IN NACL 20-0.9 MG/50ML-% IV SOLN
20.0000 mg | Freq: Once | INTRAVENOUS | Status: AC
  Administered 2015-03-06: 20 mg via INTRAVENOUS
  Filled 2015-03-06: qty 50

## 2015-03-06 MED ORDER — ONDANSETRON HCL 4 MG PO TABS: 4.0000 mg | ORAL_TABLET | Freq: Four times a day (QID) | ORAL | Status: AC | PRN

## 2015-03-06 MED ORDER — SODIUM CHLORIDE 0.9 % IV SOLN
Freq: Once | INTRAVENOUS | Status: AC
  Administered 2015-03-06: 11:00:00 via INTRAVENOUS
  Filled 2015-03-06: qty 1000

## 2015-03-06 MED ORDER — CARBOPLATIN CHEMO INJECTION 450 MG/45ML
185.0000 mg | Freq: Once | INTRAVENOUS | Status: AC
  Administered 2015-03-06: 190 mg via INTRAVENOUS
  Filled 2015-03-06: qty 19

## 2015-03-06 MED ORDER — SODIUM CHLORIDE 0.9 % IV SOLN
Freq: Once | INTRAVENOUS | Status: AC
  Administered 2015-03-06: 12:00:00 via INTRAVENOUS
  Filled 2015-03-06: qty 250

## 2015-03-06 NOTE — Progress Notes (Signed)
[place] @ [facility] [phone]  Fax:([phone]) [place]: Nov 24, 1960  MR#: [id number]  RCB#:[id number]  Patient Care Team: [name], MD as PCP - General (Family Medicine) Seeplaputhur Robinette Haines, MD (General Surgery)  CHIEF COMPLAINT:  Chief Complaint  Patient presents with  . Follow-up    Oncology History   87. 54 year old female status post recent left breast cancer in 2005  2. Right breast cancer, locally advanced stage IIIa (pyT3, N1, M0), invasive mammary carcinoma with mucin production. Status post right modified radical mastectomy for 8 cm lesion with one of 4 sentinel lymph nodes positive for metastatic disease. Tumor is ER positive PR negative HER-2/neu not overexpressed. 3. Completed neoadjuvant chemotherapy with chest wall radiation therapy,  Finished on October 21, 2013. 4. Started letrazole 2.5 mg by mouth daily from October 24, 2013. 5. Carcinoma of lung.  Right upper lobe, status post right upper lobectomy, [id number]. 6.  Left upper lobe mass with mediastinal lymph node biopsies positive for poorly differentiated adenocarcinoma T1 N1 M0 tumor III Starting chemoradiation therapy in July of 2016 (second primary) Patient had EUS in June of 2016.  Bilateral hilar adenopathy which has been biopsied was positive for metastatic adenocarcinoma consistent with lung primary Starting chemoradiation therapy in July of 2016     Malignant neoplasm of upper lobe, bronchus or lung   04/03/2013 Initial Diagnosis Malignant neoplasm of upper lobe, bronchus or lung    Malignant neoplasm of female breast   04/03/2013 Initial Diagnosis Malignant neoplasm of female breast    Lung cancer   03/05/2015 Initial Diagnosis Lung cancer   54year-old lady with second primary left upper lobe lung cancer with mediastinal lymph node.  Bilateral lymph nodes were positive for metastatic adenocarcinoma.  Patient had left upper lobe lung mass.  Here for further follow-up and  treatment consideration. Patient also has occasional headaches with MRI scan of head has been ordered and will be reviewed.  Patient is also being evaluated by Dr. Donella Stade   INTERVAL HISTORY: Patient is here with most likely appears to be having second lung cancer on the left upper lobe primary disease with contralateral lymphadenopathy both positive metastases adenocarcinoma by EUS.  Here to discuss the results and further planning of treatment.  Pathology has been reviewed and case was discussed in tumor conference this appears to be lung primary rather than metastases to from breast cancer. Case was also discussed with radiation oncologist today. MRI scan of brain has been ordered. March 06, 2015 Patient is here for ongoing evaluation and treatment consideration Patient is starting radiation therapy from July 11 Starting carboplatinum and Taxol on a weekly basis. REVIEW OF SYSTEMS:   GENERAL:  Feels good.  Active.  No fevers, sweats or weight loss. PERFORMANCE STATUS (ECOG):0 HEENT:  No visual changes, runny nose, sore throat, mouth sores or tenderness. Lungs: No shortness of breath or cough.  No hemoptysis. Cardiac:  No chest pain, palpitations, orthopnea, or PND. GI:  No nausea, vomiting, diarrhea, constipation, melena or hematochezia. GU:  No urgency, frequency, dysuria, or hematuria. Musculoskeletal:  No back pain.  No joint pain.  No muscle tenderness. Extremities:  No pain or swelling. Skin:  No rashes or skin changes. Neuro:  No  numbness or weakness, balance or coordination issues.  Recent has occasional headache Endocrine:  No diabetes, thyroid issues, hot flashes or night sweats. Psych:  No mood changes, depression or anxiety. Pain:  No focal pain. Review of systems:  All  other systems reviewed and found to be negative.  As per HPI. Otherwise, a complete review of systems is negatve.  PAST MEDICAL HISTORY: Past Medical History  Diagnosis Date  . Unspecified essential  hypertension   . Kidney failure   . Personal history of tobacco use, presenting hazards to health   . Cataract   . Personal history of malignant neoplasm of breast   . Breast screening, unspecified   . Special screening for malignant neoplasms, colon   . Cancer 2001    left breast cancer s/p L/SN/R in 2001  . Cancer 2014    right breast invasive CA, Er pos,Pr neg, Her 2 neg.  . Lung cancer, upper lobe 2014    right upper lobe  . Diabetes mellitus without complication   . MDRO (multiple drug resistant organisms) resistance   . Breast cancer   . H. pylori duodenitis   . Complication of anesthesia     BP DROPPED DURING LOBECTOMY IN 2014    PAST SURGICAL HISTORY: Past Surgical History  Procedure Laterality Date  . Intercostal nerve block  2005  . Cataract extraction w/ intraocular lens implant  2005  . Carpal tunnel release Right 2011  . Insertion central venous access device w/ subcutaneous port  12-07-12  . Breast lumpectomy Left 2001  . Breast surgery Right 2014    total mastectomy  . Right lung upper lobectomy  03/2013  . Lobectomy Right   . Cataract extraction      FAMILY HISTORY Family History  Problem Relation Age of Onset  . Diabetes Other   . Hyperlipidemia Other   . Hypertension Other     GYNECOLOGIC HISTORY:  No LMP recorded. Patient is postmenopausal.     ADVANCED DIRECTIVES: Patient does not have any advanced healthcare directive. Information has been given.   HEALTH MAINTENANCE: History  Substance Use Topics  . Smoking status: Former Smoker -- 0.50 packs/day for 30 years    Types: Cigarettes    Quit date: 02/03/2013  . Smokeless tobacco: Never Used     Comment: using 33m nicorette gum- 1/2 piece, 10 times per day  . Alcohol Use: No      Allergies  Allergen Reactions  . Metformin Diarrhea and Nausea Only  . Other Rash    Sage Wipes    Current Outpatient Prescriptions  Medication Sig Dispense Refill  . acetaminophen (TYLENOL) 325 MG  tablet Take 650 mg by mouth every 6 (six) hours as needed for pain.    .Marland Kitchengabapentin (NEURONTIN) 300 MG capsule Take 300 mg by mouth as needed.    .Marland KitchenglipiZIDE (GLUCOTROL XL) 5 MG 24 hr tablet Take by mouth.    . hydrochlorothiazide (HYDRODIURIL) 25 MG tablet Take 12.5 mg by mouth daily.     .Marland Kitchenletrozole (FEMARA) 2.5 MG tablet Take 2.5 mg by mouth daily.    . Multiple Vitamins-Minerals (MULTIVITAMIN PO) Take by mouth.    . naproxen sodium (ANAPROX) 220 MG tablet Take 220 mg by mouth every 8 (eight) hours as needed.    . nicotine polacrilex (NICORETTE) 2 MG gum Take 2 mg by mouth 5 (five) times daily.     .Marland KitchenPARoxetine Mesylate (BRISDELLE PO) Take 1 tablet by mouth at bedtime.    . simvastatin (ZOCOR) 10 MG tablet Take 10 mg by mouth daily.    . ondansetron (ZOFRAN) 4 MG tablet Take 1 tablet (4 mg total) by mouth every 6 (six) hours as needed for nausea or vomiting. 30 tablet  3   No current facility-administered medications for this visit.   Facility-Administered Medications Ordered in Other Visits  Medication Dose Route Frequency Provider Last Rate Last Dose  . 0.9 %  sodium chloride infusion   Intravenous Once Forest Gleason, MD      . CARBOplatin (PARAPLATIN) 190 mg in sodium chloride 0.9 % 100 mL chemo infusion  190 mg Intravenous Once Forest Gleason, MD      . diphenhydrAMINE (BENADRYL) injection 25 mg  25 mg Intravenous Once Forest Gleason, MD      . famotidine (PEPCID) IVPB 20 mg premix  20 mg Intravenous Once Forest Gleason, MD      . heparin lock flush 100 unit/mL  500 Units Intracatheter Once PRN Forest Gleason, MD      . ondansetron (ZOFRAN) 16 mg, dexamethasone (DECADRON) 20 mg in sodium chloride 0.9 % 50 mL IVPB   Intravenous Once Forest Gleason, MD      . PACLitaxel (TAXOL) 78 mg in dextrose 5 % 250 mL chemo infusion (</= 28m/m2)  45 mg/m2 (Treatment Plan Actual) Intravenous Once JForest Gleason MD      . sodium chloride 0.9 % injection 10 mL  10 mL Intracatheter PRN JForest Gleason MD   10 mL at  02/04/15 1027  . sodium chloride 0.9 % injection 10 mL  10 mL Intracatheter PRN JForest Gleason MD        OBJECTIVE:  Filed Vitals:   03/06/15 0911  BP: 115/73  Pulse: 93  Temp: 96.5 F (35.8 C)     Body mass index is 26.24 kg/(m^2).    ECOG FS:0 - Asymptomatic  PHYSICAL EXAM: GENERAL:  Well developed, well nourished, sitting comfortably in the exam room in no acute distress. MENTAL STATUS:  Alert and oriented to person, place and time. HEAD:   Normocephalic, atraumatic, face symmetric, no Cushingoid features. EYES:    Pupils equal round and reactive to light and accomodation.  No conjunctivitis or scleral icterus. ENT:  Oropharynx clear without lesion.  Tongue normal. Mucous membranes moist.  RESPIRATORY:  Clear to auscultation without rales, wheezes or rhonchi. CARDIOVASCULAR:  Regular rate and rhythm without murmur, rub or gallop. BREAST:  Right breast : Status post mastectomy.  No evidence of recurrent disease skin changes or nipple discharge.  Left breast without masses, skin changes or nipple discharge. ABDOMEN:  Soft, non-tender, with active bowel sounds, and no hepatosplenomegaly.  No masses. BACK:  No CVA tenderness.  No tenderness on percussion of the back or rib cage. SKIN:  No rashes, ulcers or lesions. EXTREMITIES: No edema, no skin discoloration or tenderness.  No palpable cords. LYMPH NODES: No palpable cervical, supraclavicular, axillary or inguinal adenopathy  NEUROLOGICAL: Unremarkable. PSYCH:  Appropriate.   LAB RESULTS:  Infusion on 03/06/2015  Component Date Value Ref Range Status  . WBC 03/06/2015 9.3  3.6 - 11.0 K/uL Final   A-LINE DRAW  . RBC 03/06/2015 3.43* 3.80 - 5.20 MIL/uL Final  . Hemoglobin 03/06/2015 10.5* 12.0 - 16.0 g/dL Final  . HCT 03/06/2015 31.2* 35.0 - 47.0 % Final  . MCV 03/06/2015 90.9  80.0 - 100.0 fL Final  . MCH 03/06/2015 30.5  26.0 - 34.0 pg Final  . MCHC 03/06/2015 33.6  32.0 - 36.0 g/dL Final  . RDW 03/06/2015 14.2  11.5 - 14.5  % Final  . Platelets 03/06/2015 404  150 - 440 K/uL Final  . Neutrophils Relative % 03/06/2015 77   Final  . Neutro Abs 03/06/2015 7.2* 1.4 -  6.5 K/uL Final  . Lymphocytes Relative 03/06/2015 11   Final  . Lymphs Abs 03/06/2015 1.0  1.0 - 3.6 K/uL Final  . Monocytes Relative 03/06/2015 9   Final  . Monocytes Absolute 03/06/2015 0.8  0.2 - 0.9 K/uL Final  . Eosinophils Relative 03/06/2015 2   Final  . Eosinophils Absolute 03/06/2015 0.2  0 - 0.7 K/uL Final  . Basophils Relative 03/06/2015 1   Final  . Basophils Absolute 03/06/2015 0.1  0 - 0.1 K/uL Final  . Sodium 03/06/2015 137  135 - 145 mmol/L Final  . Potassium 03/06/2015 2.8* 3.5 - 5.1 mmol/L Final   Comment: RESULTS VERIFIED BY REPEAT TESTING CRITICAL RESULT CALLED TO, READ BACK BY AND VERIFIED WITH ANITA BLACK @ 1055 03/06/15 BGB   . Chloride 03/06/2015 98* 101 - 111 mmol/L Final  . CO2 03/06/2015 27  22 - 32 mmol/L Final  . Glucose, Bld 03/06/2015 250* 65 - 99 mg/dL Final  . BUN 03/06/2015 10  6 - 20 mg/dL Final  . Creatinine, Ser 03/06/2015 0.98  0.44 - 1.00 mg/dL Final  . Calcium 03/06/2015 8.7* 8.9 - 10.3 mg/dL Final  . Total Protein 03/06/2015 7.5  6.5 - 8.1 g/dL Final  . Albumin 03/06/2015 3.2* 3.5 - 5.0 g/dL Final  . AST 03/06/2015 30  15 - 41 U/L Final  . ALT 03/06/2015 14  14 - 54 U/L Final  . Alkaline Phosphatase 03/06/2015 116  38 - 126 U/L Final  . Total Bilirubin 03/06/2015 0.4  0.3 - 1.2 mg/dL Final  . GFR calc non Af Amer 03/06/2015 >60  >60 mL/min Final  . GFR calc Af Amer 03/06/2015 >60  >60 mL/min Final   Comment: (NOTE) The eGFR has been calculated using the CKD EPI equation. This calculation has not been validated in all clinical situations. eGFR's persistently <60 mL/min signify possible Chronic Kidney Disease.   . Anion gap 03/06/2015 12  5 - 15 Final  Office Visit on 03/06/2015  Component Date Value Ref Range Status  . Magnesium 03/06/2015 2.0  1.7 - 2.4 mg/dL Final    Lab Results  Component  Value Date   LABCA2 20.3 06/11/2014     STUDIES: Mr Jeri Cos OM Contrast  03/18/2015   CLINICAL DATA:  Recently diagnosed stage III lung cancer. Personal history of breast cancer.  EXAM: MRI HEAD WITHOUT AND WITH CONTRAST  TECHNIQUE: Multiplanar, multiecho pulse sequences of the brain and surrounding structures were obtained without and with intravenous contrast.  CONTRAST:  63m MULTIHANCE GADOBENATE DIMEGLUMINE 529 MG/ML IV SOLN  COMPARISON:  None.  FINDINGS: There is no evidence of acute infarct, intracranial hemorrhage, mass, midline shift, or extra-axial fluid collection. Ventricles and sulci are normal. There are a few punctate foci of T2 hyperintensity in the subcortical cerebral white matter, nonspecific and not greater than expected for patient's age. No abnormal enhancement is identified.  Prior bilateral cataract extraction is noted. Mild bilateral ethmoid and left maxillary sinus mucosal thickening is noted. No significant mastoid effusion. Major intracranial vascular flow voids are preserved.  IMPRESSION: Unremarkable appearance of the brain for age. No evidence of intracranial metastatic disease.   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  Previous history of right upper lobe lung cancer status post resection T1 N0  M0 tumor 3.  History of carcinoma breast.  Right breast cancer status post chemoradiation therapy on anti-hormonal therapy. Previous history of left breast cancer  MEDICAL DECISION MAKING:   Pathology report has been reviewed.   MRI scan of brain was ordered because of headache and has been reviewed reported to be negative for any metastases  Case was discussed in tumor conference as well as discussed with Dr. Donella Stade  Possibility of treating these as second  lung primary with stage IIIB disease  Starting radiation and chemotherapy with carboplatinum and Taxol  Patient expressed understanding and was in agreement with this plan. She also understands that She can call clinic at any time with any questions, concerns, or complaints.  Patient was explained all the side effects of chemotherapy.  And informed consent has been obtained   Malignant neoplasm of female breast   Staging form: Breast, AJCC 7th Edition     Clinical: Stage IIIA (T3, N1, M0) - Signed by Forest Gleason, MD on 01/26/2015 Malignant neoplasm of upper lobe, bronchus or lung   Staging form: Lung, AJCC 7th Edition     Clinical: Stage IA (T1b, N0, M0) - Signed by Forest Gleason, MD on 01/26/2015   Forest Gleason, MD   03/06/2015 1:39 PM

## 2015-03-06 NOTE — Telephone Encounter (Signed)
Brad from clinic lab called with critical K+ (2.8).  MD notified.

## 2015-03-06 NOTE — Patient Instructions (Signed)
Paclitaxel injection What is this medicine? PACLITAXEL (PAK li TAX el) is a chemotherapy drug. It targets fast dividing cells, like cancer cells, and causes these cells to die. This medicine is used to treat ovarian cancer, breast cancer, and other cancers. This medicine may be used for other purposes; ask your health care provider or pharmacist if you have questions. COMMON BRAND NAME(S): Onxol, Taxol What should I tell my health care provider before I take this medicine? They need to know if you have any of these conditions: -blood disorders -irregular heartbeat -infection (especially a virus infection such as chickenpox, cold sores, or herpes) -liver disease -previous or ongoing radiation therapy -an unusual or allergic reaction to paclitaxel, alcohol, polyoxyethylated castor oil, other chemotherapy agents, other medicines, foods, dyes, or preservatives -pregnant or trying to get pregnant -breast-feeding How should I use this medicine? This drug is given as an infusion into a vein. It is administered in a hospital or clinic by a specially trained health care professional. Talk to your pediatrician regarding the use of this medicine in children. Special care may be needed. Overdosage: If you think you have taken too much of this medicine contact a poison control center or emergency room at once. NOTE: This medicine is only for you. Do not share this medicine with others. What if I miss a dose? It is important not to miss your dose. Call your doctor or health care professional if you are unable to keep an appointment. What may interact with this medicine? Do not take this medicine with any of the following medications: -disulfiram -metronidazole This medicine may also interact with the following medications: -cyclosporine -diazepam -ketoconazole -medicines to increase blood counts like filgrastim, pegfilgrastim, sargramostim -other chemotherapy drugs like cisplatin, doxorubicin,  epirubicin, etoposide, teniposide, vincristine -quinidine -testosterone -vaccines -verapamil Talk to your doctor or health care professional before taking any of these medicines: -acetaminophen -aspirin -ibuprofen -ketoprofen -naproxen This list may not describe all possible interactions. Give your health care provider a list of all the medicines, herbs, non-prescription drugs, or dietary supplements you use. Also tell them if you smoke, drink alcohol, or use illegal drugs. Some items may interact with your medicine. What should I watch for while using this medicine? Your condition will be monitored carefully while you are receiving this medicine. You will need important blood work done while you are taking this medicine. This drug may make you feel generally unwell. This is not uncommon, as chemotherapy can affect healthy cells as well as cancer cells. Report any side effects. Continue your course of treatment even though you feel ill unless your doctor tells you to stop. In some cases, you may be given additional medicines to help with side effects. Follow all directions for their use. Call your doctor or health care professional for advice if you get a fever, chills or sore throat, or other symptoms of a cold or flu. Do not treat yourself. This drug decreases your body's ability to fight infections. Try to avoid being around people who are sick. This medicine may increase your risk to bruise or bleed. Call your doctor or health care professional if you notice any unusual bleeding. Be careful brushing and flossing your teeth or using a toothpick because you may get an infection or bleed more easily. If you have any dental work done, tell your dentist you are receiving this medicine. Avoid taking products that contain aspirin, acetaminophen, ibuprofen, naproxen, or ketoprofen unless instructed by your doctor. These medicines may hide a fever.   Do not become pregnant while taking this medicine.  Women should inform their doctor if they wish to become pregnant or think they might be pregnant. There is a potential for serious side effects to an unborn child. Talk to your health care professional or pharmacist for more information. Do not breast-feed an infant while taking this medicine. Men are advised not to father a child while receiving this medicine. What side effects may I notice from receiving this medicine? Side effects that you should report to your doctor or health care professional as soon as possible: -allergic reactions like skin rash, itching or hives, swelling of the face, lips, or tongue -low blood counts - This drug may decrease the number of white blood cells, red blood cells and platelets. You may be at increased risk for infections and bleeding. -signs of infection - fever or chills, cough, sore throat, pain or difficulty passing urine -signs of decreased platelets or bleeding - bruising, pinpoint red spots on the skin, black, tarry stools, nosebleeds -signs of decreased red blood cells - unusually weak or tired, fainting spells, lightheadedness -breathing problems -chest pain -high or low blood pressure -mouth sores -nausea and vomiting -pain, swelling, redness or irritation at the injection site -pain, tingling, numbness in the hands or feet -slow or irregular heartbeat -swelling of the ankle, feet, hands Side effects that usually do not require medical attention (report to your doctor or health care professional if they continue or are bothersome): -bone pain -complete hair loss including hair on your head, underarms, pubic hair, eyebrows, and eyelashes -changes in the color of fingernails -diarrhea -loosening of the fingernails -loss of appetite -muscle or joint pain -red flush to skin -sweating This list may not describe all possible side effects. Call your doctor for medical advice about side effects. You may report side effects to FDA at  1-800-FDA-1088. Where should I keep my medicine? This drug is given in a hospital or clinic and will not be stored at home. NOTE: This sheet is a summary. It may not cover all possible information. If you have questions about this medicine, talk to your doctor, pharmacist, or health care provider.  2015, Elsevier/Gold Standard. (2012-10-10 16:41:21)  Carboplatin injection What is this medicine? CARBOPLATIN (KAR boe pla tin) is a chemotherapy drug. It targets fast dividing cells, like cancer cells, and causes these cells to die. This medicine is used to treat ovarian cancer and many other cancers. This medicine may be used for other purposes; ask your health care provider or pharmacist if you have questions. COMMON BRAND NAME(S): Paraplatin What should I tell my health care provider before I take this medicine? They need to know if you have any of these conditions: -blood disorders -hearing problems -kidney disease -recent or ongoing radiation therapy -an unusual or allergic reaction to carboplatin, cisplatin, other chemotherapy, other medicines, foods, dyes, or preservatives -pregnant or trying to get pregnant -breast-feeding How should I use this medicine? This drug is usually given as an infusion into a vein. It is administered in a hospital or clinic by a specially trained health care professional. Talk to your pediatrician regarding the use of this medicine in children. Special care may be needed. Overdosage: If you think you have taken too much of this medicine contact a poison control center or emergency room at once. NOTE: This medicine is only for you. Do not share this medicine with others. What if I miss a dose? It is important not to miss a dose. Call your   doctor or health care professional if you are unable to keep an appointment. What may interact with this medicine? -medicines for seizures -medicines to increase blood counts like filgrastim, pegfilgrastim,  sargramostim -some antibiotics like amikacin, gentamicin, neomycin, streptomycin, tobramycin -vaccines Talk to your doctor or health care professional before taking any of these medicines: -acetaminophen -aspirin -ibuprofen -ketoprofen -naproxen This list may not describe all possible interactions. Give your health care provider a list of all the medicines, herbs, non-prescription drugs, or dietary supplements you use. Also tell them if you smoke, drink alcohol, or use illegal drugs. Some items may interact with your medicine. What should I watch for while using this medicine? Your condition will be monitored carefully while you are receiving this medicine. You will need important blood work done while you are taking this medicine. This drug may make you feel generally unwell. This is not uncommon, as chemotherapy can affect healthy cells as well as cancer cells. Report any side effects. Continue your course of treatment even though you feel ill unless your doctor tells you to stop. In some cases, you may be given additional medicines to help with side effects. Follow all directions for their use. Call your doctor or health care professional for advice if you get a fever, chills or sore throat, or other symptoms of a cold or flu. Do not treat yourself. This drug decreases your body's ability to fight infections. Try to avoid being around people who are sick. This medicine may increase your risk to bruise or bleed. Call your doctor or health care professional if you notice any unusual bleeding. Be careful brushing and flossing your teeth or using a toothpick because you may get an infection or bleed more easily. If you have any dental work done, tell your dentist you are receiving this medicine. Avoid taking products that contain aspirin, acetaminophen, ibuprofen, naproxen, or ketoprofen unless instructed by your doctor. These medicines may hide a fever. Do not become pregnant while taking this  medicine. Women should inform their doctor if they wish to become pregnant or think they might be pregnant. There is a potential for serious side effects to an unborn child. Talk to your health care professional or pharmacist for more information. Do not breast-feed an infant while taking this medicine. What side effects may I notice from receiving this medicine? Side effects that you should report to your doctor or health care professional as soon as possible: -allergic reactions like skin rash, itching or hives, swelling of the face, lips, or tongue -signs of infection - fever or chills, cough, sore throat, pain or difficulty passing urine -signs of decreased platelets or bleeding - bruising, pinpoint red spots on the skin, black, tarry stools, nosebleeds -signs of decreased red blood cells - unusually weak or tired, fainting spells, lightheadedness -breathing problems -changes in hearing -changes in vision -chest pain -high blood pressure -low blood counts - This drug may decrease the number of white blood cells, red blood cells and platelets. You may be at increased risk for infections and bleeding. -nausea and vomiting -pain, swelling, redness or irritation at the injection site -pain, tingling, numbness in the hands or feet -problems with balance, talking, walking -trouble passing urine or change in the amount of urine Side effects that usually do not require medical attention (report to your doctor or health care professional if they continue or are bothersome): -hair loss -loss of appetite -metallic taste in the mouth or changes in taste This list may not describe all   possible side effects. Call your doctor for medical advice about side effects. You may report side effects to FDA at 1-800-FDA-1088. Where should I keep my medicine? This drug is given in a hospital or clinic and will not be stored at home. NOTE: This sheet is a summary. It may not cover all possible information. If you  have questions about this medicine, talk to your doctor, pharmacist, or health care provider.  2015, Elsevier/Gold Standard. (2007-11-22 14:38:05)  

## 2015-03-06 NOTE — Progress Notes (Signed)
Patient does have living will.  Former smoker.

## 2015-03-08 DIAGNOSIS — Z510 Encounter for antineoplastic radiation therapy: Secondary | ICD-10-CM | POA: Diagnosis not present

## 2015-03-11 ENCOUNTER — Ambulatory Visit
Admission: RE | Admit: 2015-03-11 | Discharge: 2015-03-11 | Disposition: A | Discharge status: Home / Self Care | Payer: 59 | Source: Ambulatory Visit | Attending: Radiation Oncology | Admitting: Radiation Oncology

## 2015-03-11 ENCOUNTER — Other Ambulatory Visit: Payer: Self-pay

## 2015-03-11 DIAGNOSIS — Z510 Encounter for antineoplastic radiation therapy: Secondary | ICD-10-CM | POA: Diagnosis not present

## 2015-03-11 NOTE — Patient Outreach (Signed)
[place] [facility]) Care Management  03/11/2015  ANUJA MANKA [birth year] [id number]   Left a message on Alisyn' cell phone to call me and update me re: health.  She is currently going through cancer treatment.   [name], RN, BA, [place], [place] Direct Dial:  [phone]  Fax:  [phone] E-mail: [email or web address] [address], [place], [place]  [address]

## 2015-03-12 ENCOUNTER — Ambulatory Visit
Admission: RE | Admit: 2015-03-12 | Discharge: 2015-03-12 | Disposition: A | Discharge status: Home / Self Care | Payer: 59 | Source: Ambulatory Visit | Attending: Radiation Oncology | Admitting: Radiation Oncology

## 2015-03-12 DIAGNOSIS — Z510 Encounter for antineoplastic radiation therapy: Secondary | ICD-10-CM | POA: Diagnosis not present

## 2015-03-13 ENCOUNTER — Ambulatory Visit
Admission: RE | Admit: 2015-03-13 | Discharge: 2015-03-13 | Disposition: A | Discharge status: Home / Self Care | Payer: 59 | Source: Ambulatory Visit | Attending: Radiation Oncology | Admitting: Radiation Oncology

## 2015-03-13 ENCOUNTER — Inpatient Hospital Stay: Payer: 59

## 2015-03-13 ENCOUNTER — Inpatient Hospital Stay (HOSPITAL_BASED_OUTPATIENT_CLINIC_OR_DEPARTMENT_OTHER): Payer: 59 | Admitting: Oncology

## 2015-03-13 ENCOUNTER — Encounter: Payer: Self-pay | Admitting: Oncology

## 2015-03-13 ENCOUNTER — Other Ambulatory Visit: Payer: Self-pay

## 2015-03-13 VITALS — BP 119/74 | HR 92 | Temp 97.4°F | Wt 143.3 lb

## 2015-03-13 DIAGNOSIS — C3412 Malignant neoplasm of upper lobe, left bronchus or lung: Secondary | ICD-10-CM | POA: Diagnosis not present

## 2015-03-13 DIAGNOSIS — C50911 Malignant neoplasm of unspecified site of right female breast: Secondary | ICD-10-CM

## 2015-03-13 DIAGNOSIS — C3490 Malignant neoplasm of unspecified part of unspecified bronchus or lung: Secondary | ICD-10-CM

## 2015-03-13 DIAGNOSIS — Z510 Encounter for antineoplastic radiation therapy: Secondary | ICD-10-CM | POA: Diagnosis not present

## 2015-03-13 DIAGNOSIS — Z170 Estrogen receptor positive status [ER+]: Secondary | ICD-10-CM | POA: Diagnosis not present

## 2015-03-13 DIAGNOSIS — Z79899 Other long term (current) drug therapy: Secondary | ICD-10-CM

## 2015-03-13 DIAGNOSIS — C779 Secondary and unspecified malignant neoplasm of lymph node, unspecified: Secondary | ICD-10-CM

## 2015-03-13 DIAGNOSIS — Z853 Personal history of malignant neoplasm of breast: Secondary | ICD-10-CM

## 2015-03-13 DIAGNOSIS — Z5111 Encounter for antineoplastic chemotherapy: Secondary | ICD-10-CM | POA: Diagnosis not present

## 2015-03-13 LAB — CBC WITH DIFFERENTIAL/PLATELET
BASOS ABS: 0.1 10*3/uL (ref 0–0.1)
Basophils Relative: 1 %
EOS ABS: 0.1 10*3/uL (ref 0–0.7)
EOS PCT: 2 %
HEMATOCRIT: 31 % — AB (ref 35.0–47.0)
HEMOGLOBIN: 10.6 g/dL — AB (ref 12.0–16.0)
Lymphocytes Relative: 9 %
Lymphs Abs: 0.9 10*3/uL — ABNORMAL LOW (ref 1.0–3.6)
MCH: 30.8 pg (ref 26.0–34.0)
MCHC: 34.2 g/dL (ref 32.0–36.0)
MCV: 90 fL (ref 80.0–100.0)
MONOS PCT: 8 %
Monocytes Absolute: 0.7 10*3/uL (ref 0.2–0.9)
NEUTROS ABS: 7.8 10*3/uL — AB (ref 1.4–6.5)
NEUTROS PCT: 80 %
Platelets: 442 10*3/uL — ABNORMAL HIGH (ref 150–440)
RBC: 3.45 MIL/uL — AB (ref 3.80–5.20)
RDW: 14.1 % (ref 11.5–14.5)
WBC: 9.8 10*3/uL (ref 3.6–11.0)

## 2015-03-13 LAB — COMPREHENSIVE METABOLIC PANEL
ALT: 22 U/L (ref 14–54)
AST: 26 U/L (ref 15–41)
Albumin: 3.6 g/dL (ref 3.5–5.0)
Alkaline Phosphatase: 134 U/L — ABNORMAL HIGH (ref 38–126)
Anion gap: 8 (ref 5–15)
BILIRUBIN TOTAL: 0.5 mg/dL (ref 0.3–1.2)
BUN: 10 mg/dL (ref 6–20)
CALCIUM: 8.9 mg/dL (ref 8.9–10.3)
CO2: 29 mmol/L (ref 22–32)
Chloride: 95 mmol/L — ABNORMAL LOW (ref 101–111)
Creatinine, Ser: 0.76 mg/dL (ref 0.44–1.00)
GFR calc non Af Amer: >60 mL/min (ref >60)
GLUCOSE: 206 mg/dL — AB (ref 65–99)
Potassium: 3 mmol/L — ABNORMAL LOW (ref 3.5–5.1)
Sodium: 132 mmol/L — ABNORMAL LOW (ref 135–145)
TOTAL PROTEIN: 8.2 g/dL — AB (ref 6.5–8.1)

## 2015-03-13 LAB — MAGNESIUM: MAGNESIUM: 2 mg/dL (ref 1.7–2.4)

## 2015-03-13 MED ORDER — SODIUM CHLORIDE 0.9 % IV SOLN
Freq: Once | INTRAVENOUS | Status: AC
  Administered 2015-03-13: 11:00:00 via INTRAVENOUS
  Filled 2015-03-13: qty 8

## 2015-03-13 MED ORDER — SODIUM CHLORIDE 0.9 % IV SOLN
Freq: Once | INTRAVENOUS | Status: AC
  Administered 2015-03-13: 11:00:00 via INTRAVENOUS
  Filled 2015-03-13: qty 1000

## 2015-03-13 MED ORDER — FAMOTIDINE IN NACL 20-0.9 MG/50ML-% IV SOLN
20.0000 mg | Freq: Once | INTRAVENOUS | Status: AC
  Administered 2015-03-13: 20 mg via INTRAVENOUS
  Filled 2015-03-13: qty 50

## 2015-03-13 MED ORDER — PACLITAXEL CHEMO INJECTION 300 MG/50ML
45.0000 mg/m2 | Freq: Once | INTRAVENOUS | Status: AC
  Administered 2015-03-13: 78 mg via INTRAVENOUS
  Filled 2015-03-13: qty 13

## 2015-03-13 MED ORDER — POTASSIUM CHLORIDE CRYS ER 20 MEQ PO TBCR: 20.0000 meq | EXTENDED_RELEASE_TABLET | Freq: Every day | ORAL | Status: DC

## 2015-03-13 MED ORDER — SODIUM CHLORIDE 0.9 % IV SOLN
215.0000 mg | Freq: Once | INTRAVENOUS | Status: AC
  Administered 2015-03-13: 220 mg via INTRAVENOUS
  Filled 2015-03-13: qty 22

## 2015-03-13 MED ORDER — DIPHENHYDRAMINE HCL 50 MG/ML IJ SOLN
25.0000 mg | Freq: Once | INTRAMUSCULAR | Status: AC
  Administered 2015-03-13: 25 mg via INTRAVENOUS
  Filled 2015-03-13: qty 1

## 2015-03-13 MED ORDER — SODIUM CHLORIDE 0.9 % IJ SOLN
10.0000 mL | INTRAMUSCULAR | Status: DC | PRN
  Administered 2015-03-13: 10 mL via INTRAVENOUS
  Filled 2015-03-13: qty 10

## 2015-03-13 MED ORDER — HEPARIN SOD (PORK) LOCK FLUSH 100 UNIT/ML IV SOLN
500.0000 [IU] | Freq: Once | INTRAVENOUS | Status: AC
Start: 2015-03-13 — End: 2015-03-13
  Administered 2015-03-13: 500 [IU] via INTRAVENOUS
  Filled 2015-03-13: qty 5

## 2015-03-13 NOTE — Patient Outreach (Signed)
[facility] [place] [facility]) Care Management  03/13/2015  Brittney Lam November 11, 1960 [id number]   Spoke to patient by phone. She was in her first radiation treatment when I called her earlier in the day. She is scheduled for 22 radiation treatments and 6 chemo treatments- she's had her first.    She has been checking blood sugars- fasting average '143mg'$ /dl and evening blood sugars are averaging '108mg'$ /dl.   She is much less short of breath than she was the last time I spoke to her- she tells me her K+ was 2.5 and since her K+ has increased she's less SOB.   Brittney Lam had not been sick the last time she had to go through chemo treatments but last week when she started this new type of chemo, she was exhausted and she didn't get out of bed 2 days after chemo- she took her Glipizide but didn't eat all day- she didn't have low blood sugar symptoms and she didn't check her sugars all day. I have suggested she check her sugar sporadically through the day when she has chemo- if she has extremely low blood sugars, she may need to speak to her MD about taking less Glipizide that day that she does not eat.    I will f/u with Dao in the next couple of weeks to assess her progress.  She is on medical leave from work.   [name], RN, BA, [place], [place] Direct Dial:  [phone]  Fax:  [phone] E-mail: [email or web address] [address] [place],   [address]

## 2015-03-13 NOTE — Progress Notes (Signed)
Patient does not have living will.  Former smoker. 

## 2015-03-13 NOTE — Progress Notes (Signed)
[place] @ [facility] Telephone:([phone]) [phone]  Fax:([phone]) [place]: 1961/06/16  MR#: [id number]  JOA#:[id number]  Patient Care Team: [name], MD as PCP - General (Family Medicine) Seeplaputhur Robinette Haines, MD (General Surgery)  CHIEF COMPLAINT:  Chief Complaint  Patient presents with  . Follow-up    Oncology History   27. 54 year old female status post recent left breast cancer in 2005  2. Right breast cancer, locally advanced stage IIIa (pyT3, N1, M0), invasive mammary carcinoma with mucin production. Status post right modified radical mastectomy for 8 cm lesion with one of 4 sentinel lymph nodes positive for metastatic disease. Tumor is ER positive PR negative HER-2/neu not overexpressed. 3. Completed neoadjuvant chemotherapy with chest wall radiation therapy,  Finished on October 21, 2013. 4. Started letrazole 2.5 mg by mouth daily from October 24, 2013. 5. Carcinoma of lung.  Right upper lobe, status post right upper lobectomy, [id number]. 6.  Left upper lobe mass with mediastinal lymph node biopsies positive for poorly differentiated adenocarcinoma T1 N1 M0 tumor III Starting chemoradiation therapy in July of 2016 (second primary) Patient had EUS in June of 2016.  Bilateral hilar adenopathy which has been biopsied was positive for metastatic adenocarcinoma consistent with lung primary Starting chemoradiation therapy in July of 2016     Malignant neoplasm of upper lobe, bronchus or lung   04/03/2013 Initial Diagnosis Malignant neoplasm of upper lobe, bronchus or lung    Malignant neoplasm of female breast   04/03/2013 Initial Diagnosis Malignant neoplasm of female breast    Lung cancer   03/05/2015 Initial Diagnosis Lung cancer   54year-old lady with second primary left upper lobe lung cancer with mediastinal lymph node.  Bilateral lymph nodes were positive for metastatic adenocarcinoma.  Patient had left upper lobe lung mass.  Here for further follow-up and  treatment consideration. Patient also has occasional headaches with MRI scan of head has been ordered and will be reviewed.  Patient is also being evaluated by Dr. Donella Stade   INTERVAL HISTORY: Patient is here with most likely appears to be having second lung cancer on the left upper lobe primary disease with contralateral lymphadenopathy both positive metastases adenocarcinoma by EUS.  Here to discuss the results and further planning of treatment.  Pathology has been reviewed and case was discussed in tumor conference this appears to be lung primary rather than metastases to from breast cancer. Case was also discussed with radiation oncologist today. MRI scan of brain has been ordered. July, 13, 2016 Patient is here for ongoing evaluation and treatment consideration Patient is here for second cycle of chemotherapy patient reports after second or third day had nausea and vomited once and had some loose stool did not take any Imodium nausea pill helped with the nausea.  But nausea vomiting and diarrhea has completely resolved within 24 hours.  Patient is also started radiation therapy and tolerating treatment very well. REVIEW OF SYSTEMS:   GENERAL:  Feels good.  Active.  No fevers, sweats or weight loss. PERFORMANCE STATUS (ECOG):0 HEENT:  No visual changes, runny nose, sore throat, mouth sores or tenderness. Lungs: No shortness of breath or cough.  No hemoptysis. Cardiac:  No chest pain, palpitations, orthopnea, or PND. GI:  No nausea, vomiting, diarrhea, constipation, melena or hematochezia. GU:  No urgency, frequency, dysuria, or hematuria. Musculoskeletal:  No back pain.  No joint pain.  No muscle tenderness. Extremities:  No pain or swelling. Skin:  No rashes or skin changes. Neuro:  No  numbness or weakness, balance or coordination issues.  Recent has occasional headache Endocrine:  No diabetes, thyroid issues, hot flashes or night sweats. Psych:  No mood changes, depression or  anxiety. Pain:  No focal pain. Review of systems:  All other systems reviewed and found to be negative.  As per HPI. Otherwise, a complete review of systems is negatve.  PAST MEDICAL HISTORY: Past Medical History  Diagnosis Date  . Unspecified essential hypertension   . Kidney failure   . Personal history of tobacco use, presenting hazards to health   . Cataract   . Personal history of malignant neoplasm of breast   . Breast screening, unspecified   . Special screening for malignant neoplasms, colon   . Cancer 2001    left breast cancer s/p L/SN/R in 2001  . Cancer 2014    right breast invasive CA, Er pos,Pr neg, Her 2 neg.  . Lung cancer, upper lobe 2014    right upper lobe  . Diabetes mellitus without complication   . MDRO (multiple drug resistant organisms) resistance   . Breast cancer   . H. pylori duodenitis   . Complication of anesthesia     BP DROPPED DURING LOBECTOMY IN 2014    PAST SURGICAL HISTORY: Past Surgical History  Procedure Laterality Date  . Intercostal nerve block  2005  . Cataract extraction w/ intraocular lens implant  2005  . Carpal tunnel release Right 2011  . Insertion central venous access device w/ subcutaneous port  12-07-12  . Breast lumpectomy Left 2001  . Breast surgery Right 2014    total mastectomy  . Right lung upper lobectomy  03/2013  . Lobectomy Right   . Cataract extraction      FAMILY HISTORY Family History  Problem Relation Age of Onset  . Diabetes Other   . Hyperlipidemia Other   . Hypertension Other     GYNECOLOGIC HISTORY:  No LMP recorded. Patient is postmenopausal.     ADVANCED DIRECTIVES: Patient does not have any advanced healthcare directive. Information has been given.   HEALTH MAINTENANCE: History  Substance Use Topics  . Smoking status: Former Smoker -- 0.50 packs/day for 30 years    Types: Cigarettes    Quit date: 02/03/2013  . Smokeless tobacco: Never Used     Comment: using 45m nicorette gum- 1/2  piece, 10 times per day  . Alcohol Use: No      Allergies  Allergen Reactions  . Metformin Diarrhea and Nausea Only  . Other Rash    Sage Wipes    Current Outpatient Prescriptions  Medication Sig Dispense Refill  . acetaminophen (TYLENOL) 325 MG tablet Take 650 mg by mouth every 6 (six) hours as needed for pain.    .Marland Kitchengabapentin (NEURONTIN) 300 MG capsule Take 300 mg by mouth as needed.    .Marland KitchenglipiZIDE (GLUCOTROL XL) 5 MG 24 hr tablet Take by mouth.    . hydrochlorothiazide (HYDRODIURIL) 25 MG tablet Take 12.5 mg by mouth daily.     .Marland Kitchenletrozole (FEMARA) 2.5 MG tablet Take 2.5 mg by mouth daily.    . Multiple Vitamins-Minerals (MULTIVITAMIN PO) Take by mouth.    . naproxen sodium (ANAPROX) 220 MG tablet Take 220 mg by mouth every 8 (eight) hours as needed.    . nicotine polacrilex (NICORETTE) 2 MG gum Take 2 mg by mouth 5 (five) times daily.     . ondansetron (ZOFRAN) 4 MG tablet Take 1 tablet (4 mg total) by  mouth every 6 (six) hours as needed for nausea or vomiting. 30 tablet 3  . PARoxetine Mesylate (BRISDELLE PO) Take 1 tablet by mouth at bedtime.    . simvastatin (ZOCOR) 10 MG tablet Take 10 mg by mouth daily.    . potassium chloride SA (K-DUR,KLOR-CON) 20 MEQ tablet Take 1 tablet (20 mEq total) by mouth daily. 30 tablet 0   No current facility-administered medications for this visit.   Facility-Administered Medications Ordered in Other Visits  Medication Dose Route Frequency Provider Last Rate Last Dose  . sodium chloride 0.9 % injection 10 mL  10 mL Intracatheter PRN Forest Gleason, MD   10 mL at 02/04/15 1027  . sodium chloride 0.9 % injection 10 mL  10 mL Intravenous PRN Forest Gleason, MD   10 mL at 03/13/15 0916    OBJECTIVE:  Filed Vitals:   03/13/15 0958  BP: 119/74  Pulse: 92  Temp: 97.4 F (36.3 C)     Body mass index is 26.2 kg/(m^2).    ECOG FS:0 - Asymptomatic  PHYSICAL EXAM: GENERAL:  Well developed, well nourished, sitting comfortably in the exam room in no  acute distress. MENTAL STATUS:  Alert and oriented to person, place and time. HEAD:   Normocephalic, atraumatic, face symmetric, no Cushingoid features. EYES:    Pupils equal round and reactive to light and accomodation.  No conjunctivitis or scleral icterus. ENT:  Oropharynx clear without lesion.  Tongue normal. Mucous membranes moist.  RESPIRATORY:  Clear to auscultation without rales, wheezes or rhonchi. CARDIOVASCULAR:  Regular rate and rhythm without murmur, rub or gallop. BREAST:  Right breast : Status post mastectomy.  No evidence of recurrent disease skin changes or nipple discharge.  Left breast without masses, skin changes or nipple discharge. ABDOMEN:  Soft, non-tender, with active bowel sounds, and no hepatosplenomegaly.  No masses. BACK:  No CVA tenderness.  No tenderness on percussion of the back or rib cage. SKIN:  No rashes, ulcers or lesions. EXTREMITIES: No edema, no skin discoloration or tenderness.  No palpable cords. LYMPH NODES: No palpable cervical, supraclavicular, axillary or inguinal adenopathy  NEUROLOGICAL: Unremarkable. PSYCH:  Appropriate.   LAB RESULTS:  Infusion on 03/13/2015  Component Date Value Ref Range Status  . WBC 03/13/2015 9.8  3.6 - 11.0 K/uL Final   A-LINE DRAW  . RBC 03/13/2015 3.45* 3.80 - 5.20 MIL/uL Final  . Hemoglobin 03/13/2015 10.6* 12.0 - 16.0 g/dL Final  . HCT 03/13/2015 31.0* 35.0 - 47.0 % Final  . MCV 03/13/2015 90.0  80.0 - 100.0 fL Final  . MCH 03/13/2015 30.8  26.0 - 34.0 pg Final  . MCHC 03/13/2015 34.2  32.0 - 36.0 g/dL Final  . RDW 03/13/2015 14.1  11.5 - 14.5 % Final  . Platelets 03/13/2015 442* 150 - 440 K/uL Final  . Neutrophils Relative % 03/13/2015 80   Final  . Neutro Abs 03/13/2015 7.8* 1.4 - 6.5 K/uL Final  . Lymphocytes Relative 03/13/2015 9   Final  . Lymphs Abs 03/13/2015 0.9* 1.0 - 3.6 K/uL Final  . Monocytes Relative 03/13/2015 8   Final  . Monocytes Absolute 03/13/2015 0.7  0.2 - 0.9 K/uL Final  .  Eosinophils Relative 03/13/2015 2   Final  . Eosinophils Absolute 03/13/2015 0.1  0 - 0.7 K/uL Final  . Basophils Relative 03/13/2015 1   Final  . Basophils Absolute 03/13/2015 0.1  0 - 0.1 K/uL Final  . Sodium 03/13/2015 132* 135 - 145 mmol/L Final  . Potassium  03/13/2015 3.0* 3.5 - 5.1 mmol/L Final  . Chloride 03/13/2015 95* 101 - 111 mmol/L Final  . CO2 03/13/2015 29  22 - 32 mmol/L Final  . Glucose, Bld 03/13/2015 206* 65 - 99 mg/dL Final  . BUN 03/13/2015 10  6 - 20 mg/dL Final  . Creatinine, Ser 03/13/2015 0.76  0.44 - 1.00 mg/dL Final  . Calcium 03/13/2015 8.9  8.9 - 10.3 mg/dL Final  . Total Protein 03/13/2015 8.2* 6.5 - 8.1 g/dL Final  . Albumin 03/13/2015 3.6  3.5 - 5.0 g/dL Final  . AST 03/13/2015 26  15 - 41 U/L Final  . ALT 03/13/2015 22  14 - 54 U/L Final  . Alkaline Phosphatase 03/13/2015 134* 38 - 126 U/L Final  . Total Bilirubin 03/13/2015 0.5  0.3 - 1.2 mg/dL Final  . GFR calc non Af Amer 03/13/2015 >60  >60 mL/min Final  . GFR calc Af Amer 03/13/2015 >60  >60 mL/min Final   Comment: (NOTE) The eGFR has been calculated using the CKD EPI equation. This calculation has not been validated in all clinical situations. eGFR's persistently <60 mL/min signify possible Chronic Kidney Disease.   . Anion gap 03/13/2015 8  5 - 15 Final  . Magnesium 03/13/2015 2.0  1.7 - 2.4 mg/dL Final    Lab Results  Component Value Date   LABCA2 20.3 06/11/2014     STUDIES: Mr Jeri Cos TW Contrast  03/11/15   CLINICAL DATA:  Recently diagnosed stage III lung cancer. Personal history of breast cancer.  EXAM: MRI HEAD WITHOUT AND WITH CONTRAST  TECHNIQUE: Multiplanar, multiecho pulse sequences of the brain and surrounding structures were obtained without and with intravenous contrast.  CONTRAST:  4m MULTIHANCE GADOBENATE DIMEGLUMINE 529 MG/ML IV SOLN  COMPARISON:  None.  FINDINGS: There is no evidence of acute infarct, intracranial hemorrhage, mass, midline shift, or extra-axial fluid  collection. Ventricles and sulci are normal. There are a few punctate foci of T2 hyperintensity in the subcortical cerebral white matter, nonspecific and not greater than expected for patient's age. No abnormal enhancement is identified.  Prior bilateral cataract extraction is noted. Mild bilateral ethmoid and left maxillary sinus mucosal thickening is noted. No significant mastoid effusion. Major intracranial vascular flow voids are preserved.  IMPRESSION: Unremarkable appearance of the brain for age. No evidence of intracranial metastatic disease.   Electronically Signed   By: ALogan Bores  On: 007/07/1613:26    ASSESSMENT: 1 left upper lobe lung mass with mediastinal lymph node along with contralateral hilar lymphadenopathy both positive for metastatic adenocarcinoma T1 N3 M0 tumor stage IIIB 2.  Previous history of right upper lobe lung cancer status post resection T1 N0 M0 tumor 3.  History of carcinoma breast.  Right breast cancer status post chemoradiation therapy on anti-hormonal therapy. Previous history of left breast cancer  MEDICAL DECISION MAKING:   All lab data has been reviewed.  We will proceed with second cycle of chemotherapy with carboplatinum and Taxol Continue radiation therapy Patient was instructed to call me if gets diarrhea again.  Was advised to use Imodium 2 tablets right away with first loose stool and then 1 tablet every 4 hour.  Needed  Patient expressed understanding and was in agreement with this plan. She also understands that She can call clinic at any time with any questions, concerns, or complaints.  Patient was explained all the side effects of chemotherapy.  And informed consent has been obtained   Malignant neoplasm of female breast  Staging form: Breast, AJCC 7th Edition     Clinical: Stage IIIA (T3, N1, M0) - Signed by Forest Gleason, MD on 01/26/2015 Malignant neoplasm of upper lobe, bronchus or lung   Staging form: Lung, AJCC 7th Edition      Clinical: Stage IA (T1b, N0, M0) - Signed by Forest Gleason, MD on 01/26/2015   Forest Gleason, MD   03/13/2015 6:01 PM

## 2015-03-14 ENCOUNTER — Ambulatory Visit
Admission: RE | Admit: 2015-03-14 | Discharge: 2015-03-14 | Disposition: A | Discharge status: Home / Self Care | Payer: 59 | Source: Ambulatory Visit | Attending: Radiation Oncology | Admitting: Radiation Oncology

## 2015-03-14 DIAGNOSIS — Z510 Encounter for antineoplastic radiation therapy: Secondary | ICD-10-CM | POA: Diagnosis not present

## 2015-03-15 ENCOUNTER — Ambulatory Visit
Admission: RE | Admit: 2015-03-15 | Discharge: 2015-03-15 | Disposition: A | Discharge status: Home / Self Care | Payer: 59 | Source: Ambulatory Visit | Attending: Radiation Oncology | Admitting: Radiation Oncology

## 2015-03-15 DIAGNOSIS — Z510 Encounter for antineoplastic radiation therapy: Secondary | ICD-10-CM | POA: Diagnosis not present

## 2015-03-18 ENCOUNTER — Ambulatory Visit
Admission: RE | Admit: 2015-03-18 | Discharge: 2015-03-18 | Disposition: A | Discharge status: Home / Self Care | Payer: 59 | Source: Ambulatory Visit | Attending: General Surgery | Admitting: General Surgery

## 2015-03-18 ENCOUNTER — Ambulatory Visit
Admission: RE | Admit: 2015-03-18 | Discharge: 2015-03-18 | Disposition: A | Discharge status: Home / Self Care | Payer: 59 | Source: Ambulatory Visit | Attending: Radiation Oncology | Admitting: Radiation Oncology

## 2015-03-18 DIAGNOSIS — Z853 Personal history of malignant neoplasm of breast: Secondary | ICD-10-CM

## 2015-03-18 DIAGNOSIS — Z1231 Encounter for screening mammogram for malignant neoplasm of breast: Secondary | ICD-10-CM | POA: Diagnosis present

## 2015-03-18 DIAGNOSIS — Z510 Encounter for antineoplastic radiation therapy: Secondary | ICD-10-CM | POA: Diagnosis not present

## 2015-03-19 ENCOUNTER — Ambulatory Visit
Admission: RE | Admit: 2015-03-19 | Discharge: 2015-03-19 | Disposition: A | Discharge status: Home / Self Care | Payer: 59 | Source: Ambulatory Visit | Attending: Radiation Oncology | Admitting: Radiation Oncology

## 2015-03-19 ENCOUNTER — Ambulatory Visit: Payer: 59 | Admitting: Radiation Oncology

## 2015-03-19 DIAGNOSIS — Z510 Encounter for antineoplastic radiation therapy: Secondary | ICD-10-CM | POA: Diagnosis not present

## 2015-03-20 ENCOUNTER — Inpatient Hospital Stay: Payer: 59

## 2015-03-20 ENCOUNTER — Ambulatory Visit
Admission: RE | Admit: 2015-03-20 | Discharge: 2015-03-20 | Disposition: A | Discharge status: Home / Self Care | Payer: 59 | Source: Ambulatory Visit | Attending: Radiation Oncology | Admitting: Radiation Oncology

## 2015-03-20 VITALS — BP 104/68 | HR 97 | Temp 98.4°F | Resp 20

## 2015-03-20 DIAGNOSIS — Z5111 Encounter for antineoplastic chemotherapy: Secondary | ICD-10-CM | POA: Diagnosis not present

## 2015-03-20 DIAGNOSIS — Z510 Encounter for antineoplastic radiation therapy: Secondary | ICD-10-CM | POA: Diagnosis not present

## 2015-03-20 DIAGNOSIS — C3490 Malignant neoplasm of unspecified part of unspecified bronchus or lung: Secondary | ICD-10-CM

## 2015-03-20 LAB — CBC WITH DIFFERENTIAL/PLATELET
BASOS ABS: 0.1 10*3/uL (ref 0–0.1)
BASOS PCT: 1 %
Eosinophils Absolute: 0.1 10*3/uL (ref 0–0.7)
Eosinophils Relative: 2 %
HEMATOCRIT: 28.8 % — AB (ref 35.0–47.0)
Hemoglobin: 9.7 g/dL — ABNORMAL LOW (ref 12.0–16.0)
LYMPHS PCT: 9 %
Lymphs Abs: 0.5 10*3/uL — ABNORMAL LOW (ref 1.0–3.6)
MCH: 30.8 pg (ref 26.0–34.0)
MCHC: 33.7 g/dL (ref 32.0–36.0)
MCV: 91.6 fL (ref 80.0–100.0)
MONO ABS: 0.6 10*3/uL (ref 0.2–0.9)
Monocytes Relative: 10 %
NEUTROS ABS: 4.6 10*3/uL (ref 1.4–6.5)
Neutrophils Relative %: 78 %
Platelets: 415 10*3/uL (ref 150–440)
RBC: 3.14 MIL/uL — ABNORMAL LOW (ref 3.80–5.20)
RDW: 14.7 % — AB (ref 11.5–14.5)
WBC: 5.8 10*3/uL (ref 3.6–11.0)

## 2015-03-20 LAB — BASIC METABOLIC PANEL
ANION GAP: 4 — AB (ref 5–15)
BUN: 9 mg/dL (ref 6–20)
CHLORIDE: 102 mmol/L (ref 101–111)
CO2: 26 mmol/L (ref 22–32)
CREATININE: 0.79 mg/dL (ref 0.44–1.00)
Calcium: 8.6 mg/dL — ABNORMAL LOW (ref 8.9–10.3)
GFR calc Af Amer: >60 mL/min (ref >60)
GFR calc non Af Amer: >60 mL/min (ref >60)
Glucose, Bld: 198 mg/dL — ABNORMAL HIGH (ref 65–99)
POTASSIUM: 3.7 mmol/L (ref 3.5–5.1)
SODIUM: 132 mmol/L — AB (ref 135–145)

## 2015-03-20 MED ORDER — SODIUM CHLORIDE 0.9 % IJ SOLN
10.0000 mL | INTRAMUSCULAR | Status: DC | PRN
  Administered 2015-03-20: 10 mL
  Filled 2015-03-20: qty 10

## 2015-03-20 MED ORDER — CARBOPLATIN CHEMO INJECTION 450 MG/45ML
215.0000 mg | Freq: Once | INTRAVENOUS | Status: AC
  Administered 2015-03-20: 220 mg via INTRAVENOUS
  Filled 2015-03-20: qty 22

## 2015-03-20 MED ORDER — FAMOTIDINE IN NACL 20-0.9 MG/50ML-% IV SOLN
20.0000 mg | Freq: Once | INTRAVENOUS | Status: AC
  Administered 2015-03-20: 20 mg via INTRAVENOUS
  Filled 2015-03-20: qty 50

## 2015-03-20 MED ORDER — SODIUM CHLORIDE 0.9 % IV SOLN
Freq: Once | INTRAVENOUS | Status: AC
  Administered 2015-03-20: 11:00:00 via INTRAVENOUS
  Filled 2015-03-20: qty 1000

## 2015-03-20 MED ORDER — SODIUM CHLORIDE 0.9 % IV SOLN
Freq: Once | INTRAVENOUS | Status: AC
  Administered 2015-03-20: 11:00:00 via INTRAVENOUS
  Filled 2015-03-20: qty 8

## 2015-03-20 MED ORDER — HEPARIN SOD (PORK) LOCK FLUSH 100 UNIT/ML IV SOLN
500.0000 [IU] | Freq: Once | INTRAVENOUS | Status: AC | PRN
  Administered 2015-03-20: 500 [IU]
  Filled 2015-03-20: qty 5

## 2015-03-20 MED ORDER — DEXTROSE 5 % IV SOLN
45.0000 mg/m2 | Freq: Once | INTRAVENOUS | Status: AC
  Administered 2015-03-20: 78 mg via INTRAVENOUS
  Filled 2015-03-20: qty 13

## 2015-03-20 MED ORDER — DIPHENHYDRAMINE HCL 50 MG/ML IJ SOLN
25.0000 mg | Freq: Once | INTRAMUSCULAR | Status: AC
  Administered 2015-03-20: 25 mg via INTRAVENOUS
  Filled 2015-03-20: qty 1

## 2015-03-21 ENCOUNTER — Other Ambulatory Visit: Payer: 59

## 2015-03-21 ENCOUNTER — Ambulatory Visit: Payer: 59

## 2015-03-21 ENCOUNTER — Ambulatory Visit
Admission: RE | Admit: 2015-03-21 | Discharge: 2015-03-21 | Disposition: A | Discharge status: Home / Self Care | Payer: 59 | Source: Ambulatory Visit | Attending: Radiation Oncology | Admitting: Radiation Oncology

## 2015-03-21 DIAGNOSIS — Z510 Encounter for antineoplastic radiation therapy: Secondary | ICD-10-CM | POA: Diagnosis not present

## 2015-03-22 ENCOUNTER — Ambulatory Visit
Admission: RE | Admit: 2015-03-22 | Discharge: 2015-03-22 | Disposition: A | Discharge status: Home / Self Care | Payer: 59 | Source: Ambulatory Visit | Attending: Radiation Oncology | Admitting: Radiation Oncology

## 2015-03-22 DIAGNOSIS — Z510 Encounter for antineoplastic radiation therapy: Secondary | ICD-10-CM | POA: Diagnosis not present

## 2015-03-25 ENCOUNTER — Other Ambulatory Visit: Payer: Self-pay

## 2015-03-25 ENCOUNTER — Ambulatory Visit
Admission: RE | Admit: 2015-03-25 | Discharge: 2015-03-25 | Disposition: A | Discharge status: Home / Self Care | Payer: 59 | Source: Ambulatory Visit | Attending: Radiation Oncology | Admitting: Radiation Oncology

## 2015-03-25 DIAGNOSIS — Z510 Encounter for antineoplastic radiation therapy: Secondary | ICD-10-CM | POA: Diagnosis not present

## 2015-03-25 NOTE — Patient Outreach (Signed)
[place] [facility]) Care Management  03/25/2015  Brittney Lam 07/15/1961 [id number]   Patient left a message- she is having some difficulty with radiation treatments and is having difficulty swallowing- she meets with MD tomorrow to discuss.  She may have to stop treatment for a period in order allow for healing.  She did not elaborate on diabetes numbers/hypoglycemia episodes or medications    [name], RN, [place], [place], [place] [facility] [employer] Dial:  [phone]  Fax:  [phone] E-mail: [email or web address] [address], [place], [place]  [address]      I will follow up with her in a couple of weeks.

## 2015-03-25 NOTE — Patient Outreach (Signed)
[name]  03/25/2015  Brittney Lam Jan 23, 1961 [id number]  Left message with Dameshia- inquiring about current cancer treatment and blood sugar control.      [name], RN, BA, [place], [place] Direct Dial:  [phone]  Fax:  [phone] E-mail: [email or web address] [address], [place], [place]  [address]

## 2015-03-26 ENCOUNTER — Ambulatory Visit
Admission: RE | Admit: 2015-03-26 | Discharge: 2015-03-26 | Disposition: A | Discharge status: Home / Self Care | Payer: 59 | Source: Ambulatory Visit | Attending: Radiation Oncology | Admitting: Radiation Oncology

## 2015-03-26 DIAGNOSIS — Z510 Encounter for antineoplastic radiation therapy: Secondary | ICD-10-CM | POA: Diagnosis not present

## 2015-03-27 ENCOUNTER — Ambulatory Visit
Admission: RE | Admit: 2015-03-27 | Discharge: 2015-03-27 | Disposition: A | Discharge status: Home / Self Care | Payer: 59 | Source: Ambulatory Visit | Attending: Radiation Oncology | Admitting: Radiation Oncology

## 2015-03-27 ENCOUNTER — Inpatient Hospital Stay (HOSPITAL_BASED_OUTPATIENT_CLINIC_OR_DEPARTMENT_OTHER): Payer: 59 | Admitting: Oncology

## 2015-03-27 ENCOUNTER — Inpatient Hospital Stay: Payer: 59

## 2015-03-27 ENCOUNTER — Other Ambulatory Visit: Payer: Self-pay | Admitting: *Deleted

## 2015-03-27 ENCOUNTER — Encounter: Payer: Self-pay | Admitting: Oncology

## 2015-03-27 VITALS — BP 104/70 | HR 93 | Temp 98.1°F | Wt 140.0 lb

## 2015-03-27 DIAGNOSIS — Z5111 Encounter for antineoplastic chemotherapy: Secondary | ICD-10-CM | POA: Diagnosis not present

## 2015-03-27 DIAGNOSIS — C779 Secondary and unspecified malignant neoplasm of lymph node, unspecified: Secondary | ICD-10-CM

## 2015-03-27 DIAGNOSIS — Z170 Estrogen receptor positive status [ER+]: Secondary | ICD-10-CM

## 2015-03-27 DIAGNOSIS — C3412 Malignant neoplasm of upper lobe, left bronchus or lung: Secondary | ICD-10-CM | POA: Diagnosis not present

## 2015-03-27 DIAGNOSIS — Z79899 Other long term (current) drug therapy: Secondary | ICD-10-CM

## 2015-03-27 DIAGNOSIS — Z510 Encounter for antineoplastic radiation therapy: Secondary | ICD-10-CM | POA: Diagnosis not present

## 2015-03-27 DIAGNOSIS — C50911 Malignant neoplasm of unspecified site of right female breast: Secondary | ICD-10-CM

## 2015-03-27 DIAGNOSIS — C3490 Malignant neoplasm of unspecified part of unspecified bronchus or lung: Secondary | ICD-10-CM

## 2015-03-27 LAB — CBC WITH DIFFERENTIAL/PLATELET
Basophils Absolute: 0.1 10*3/uL (ref 0–0.1)
Basophils Relative: 1 %
Eosinophils Absolute: 0.1 10*3/uL (ref 0–0.7)
Eosinophils Relative: 1 %
HEMATOCRIT: 28.3 % — AB (ref 35.0–47.0)
Hemoglobin: 9.5 g/dL — ABNORMAL LOW (ref 12.0–16.0)
Lymphocytes Relative: 6 %
Lymphs Abs: 0.4 10*3/uL — ABNORMAL LOW (ref 1.0–3.6)
MCH: 30.8 pg (ref 26.0–34.0)
MCHC: 33.5 g/dL (ref 32.0–36.0)
MCV: 92.1 fL (ref 80.0–100.0)
Monocytes Absolute: 0.7 10*3/uL (ref 0.2–0.9)
Monocytes Relative: 12 %
NEUTROS PCT: 80 %
Neutro Abs: 4.8 10*3/uL (ref 1.4–6.5)
Platelets: 348 10*3/uL (ref 150–440)
RBC: 3.07 MIL/uL — AB (ref 3.80–5.20)
RDW: 15.1 % — ABNORMAL HIGH (ref 11.5–14.5)
WBC: 6 10*3/uL (ref 3.6–11.0)

## 2015-03-27 LAB — COMPREHENSIVE METABOLIC PANEL
ALT: 17 U/L (ref 14–54)
ANION GAP: 6 (ref 5–15)
AST: 21 U/L (ref 15–41)
Albumin: 3.2 g/dL — ABNORMAL LOW (ref 3.5–5.0)
Alkaline Phosphatase: 129 U/L — ABNORMAL HIGH (ref 38–126)
BILIRUBIN TOTAL: 0.5 mg/dL (ref 0.3–1.2)
BUN: 12 mg/dL (ref 6–20)
CHLORIDE: 99 mmol/L — AB (ref 101–111)
CO2: 27 mmol/L (ref 22–32)
Calcium: 8.6 mg/dL — ABNORMAL LOW (ref 8.9–10.3)
Creatinine, Ser: 0.78 mg/dL (ref 0.44–1.00)
GFR calc non Af Amer: >60 mL/min (ref >60)
Glucose, Bld: 157 mg/dL — ABNORMAL HIGH (ref 65–99)
POTASSIUM: 3.7 mmol/L (ref 3.5–5.1)
Sodium: 132 mmol/L — ABNORMAL LOW (ref 135–145)
TOTAL PROTEIN: 7.6 g/dL (ref 6.5–8.1)

## 2015-03-27 MED ORDER — SODIUM CHLORIDE 0.9 % IV SOLN
215.0000 mg | Freq: Once | INTRAVENOUS | Status: AC
  Administered 2015-03-27: 220 mg via INTRAVENOUS
  Filled 2015-03-27: qty 22

## 2015-03-27 MED ORDER — DIPHENHYDRAMINE HCL 50 MG/ML IJ SOLN
25.0000 mg | Freq: Once | INTRAMUSCULAR | Status: AC
  Administered 2015-03-27: 25 mg via INTRAVENOUS
  Filled 2015-03-27: qty 1

## 2015-03-27 MED ORDER — DEXAMETHASONE SODIUM PHOSPHATE 100 MG/10ML IJ SOLN
Freq: Once | INTRAMUSCULAR | Status: AC
  Administered 2015-03-27: 11:00:00 via INTRAVENOUS
  Filled 2015-03-27: qty 8

## 2015-03-27 MED ORDER — ESOMEPRAZOLE MAGNESIUM 20 MG PO CPDR: 20.0000 mg | DELAYED_RELEASE_CAPSULE | Freq: Every morning | ORAL | Status: AC

## 2015-03-27 MED ORDER — DEXTROSE 5 % IV SOLN
45.0000 mg/m2 | Freq: Once | INTRAVENOUS | Status: AC
  Administered 2015-03-27: 78 mg via INTRAVENOUS
  Filled 2015-03-27: qty 13

## 2015-03-27 MED ORDER — HEPARIN SOD (PORK) LOCK FLUSH 100 UNIT/ML IV SOLN
500.0000 [IU] | Freq: Once | INTRAVENOUS | Status: AC | PRN
  Administered 2015-03-27: 500 [IU]
  Filled 2015-03-27: qty 5

## 2015-03-27 MED ORDER — DEXAMETHASONE 4 MG PO TABS: 4.0000 mg | ORAL_TABLET | Freq: Every day | ORAL | Status: DC

## 2015-03-27 MED ORDER — SODIUM CHLORIDE 0.9 % IV SOLN
Freq: Once | INTRAVENOUS | Status: AC
  Administered 2015-03-27: 10:00:00 via INTRAVENOUS
  Filled 2015-03-27: qty 1000

## 2015-03-27 MED ORDER — FAMOTIDINE IN NACL 20-0.9 MG/50ML-% IV SOLN
20.0000 mg | Freq: Once | INTRAVENOUS | Status: AC
  Administered 2015-03-27: 20 mg via INTRAVENOUS
  Filled 2015-03-27: qty 50

## 2015-03-27 NOTE — Progress Notes (Signed)
Patient does not have living will.  Former smoker.  Complains of problems swallowing due to radiation.  Feels like her food gets stuck.

## 2015-03-27 NOTE — Progress Notes (Signed)
[place] @ [facility] Telephone:(336) [phone]  Fax:([phone]) [place]: May 18, 1961  MR#: [id number]  ERD#:[id number]  Patient Care Team: [name], MD as PCP - General (Family Medicine) Seeplaputhur Robinette Haines, MD (General Surgery)  CHIEF COMPLAINT:  Chief Complaint  Patient presents with  . Follow-up    Oncology History   68. 54 year old female status post recent left breast cancer in 2005  2. Right breast cancer, locally advanced stage IIIa (pyT3, N1, M0), invasive mammary carcinoma with mucin production. Status post right modified radical mastectomy for 8 cm lesion with one of 4 sentinel lymph nodes positive for metastatic disease. Tumor is ER positive PR negative HER-2/neu not overexpressed. 3. Completed neoadjuvant chemotherapy with chest wall radiation therapy,  Finished on October 21, 2013. 4. Started letrazole 2.5 mg by mouth daily from October 24, 2013. 5. Carcinoma of lung.  Right upper lobe, status post right upper lobectomy, T1BN0M0. 6.  Left upper lobe mass with mediastinal lymph node biopsies positive for poorly differentiated adenocarcinoma T1 N1 M0 tumor III Starting chemoradiation therapy in July of 2016 (second primary) Patient had EUS in June of 2016.  Bilateral hilar adenopathy which has been biopsied was positive for metastatic adenocarcinoma consistent with lung primary Starting chemoradiation therapy in July of 2016     Malignant neoplasm of upper lobe, bronchus or lung   04/03/2013 Initial Diagnosis Malignant neoplasm of upper lobe, bronchus or lung    Malignant neoplasm of female breast   04/03/2013 Initial Diagnosis Malignant neoplasm of female breast    Lung cancer   03/05/2015 Initial Diagnosis Lung cancer   54year-old lady with second primary left upper lobe lung cancer with mediastinal lymph node.  Bilateral lymph nodes were positive for metastatic adenocarcinoma.  Patient had left upper lobe lung mass.  Here for further follow-up and  treatment consideration. Patient also has occasional headaches with MRI scan of head has been ordered and will be reviewed.  Patient is also being evaluated by Dr. Donella Stade   INTERVAL HISTORY: Patient is here with most likely appears to be having second lung cancer on the left upper lobe primary disease with contralateral lymphadenopathy both positive metastases adenocarcinoma by EUS.  Here to discuss the results and further planning of treatment.  Pathology has been reviewed and case was discussed in tumor conference this appears to be lung primary rather than metastases to from breast cancer. Case was also discussed with radiation oncologist today. MRI scan of brain has been ordered.  Which was negative for any malignancy. March 27, 2015 Patient is here for the fourth cycle of chemotherapy tolerating treatment very well.  Getting radiation therapy no nausea no vomiting no diarrhea.  Has lost all her hair.  No stomatitis.  No difficulty swallowing. Review of systems: GENERAL:  Feels good.  Active.  No fevers, sweats or weight loss. PERFORMANCE STATUS (ECOG):0 HEENT:  No visual changes, runny nose, sore throat, mouth sores or tenderness. Lungs: No shortness of breath or cough.  No hemoptysis. Cardiac:  No chest pain, palpitations, orthopnea, or PND. GI:  No nausea, vomiting, diarrhea, constipation, melena or hematochezia. GU:  No urgency, frequency, dysuria, or hematuria. Musculoskeletal:  No back pain.  No joint pain.  No muscle tenderness. Extremities:  No pain or swelling. Skin:  No rashes or skin changes. Neuro:  No  numbness or weakness, balance or coordination issues.  Recent has occasional headache Endocrine:  No diabetes, thyroid issues, hot flashes or night sweats. Psych:  No mood changes, depression or anxiety. Pain:  No focal pain. Review of systems:  All other systems reviewed and found to be negative.  As per HPI. Otherwise, a complete review of systems is negatve.  PAST  MEDICAL HISTORY: Past Medical History  Diagnosis Date  . Unspecified essential hypertension   . Kidney failure   . Personal history of tobacco use, presenting hazards to health   . Cataract   . Personal history of malignant neoplasm of breast   . Breast screening, unspecified   . Special screening for malignant neoplasms, colon   . Diabetes mellitus without complication   . MDRO (multiple drug resistant organisms) resistance   . H. pylori duodenitis   . Complication of anesthesia     BP DROPPED DURING LOBECTOMY IN 2014  . Cancer 2001    left breast cancer s/p L/SN/R in 2001  . Cancer 2014    right breast invasive CA, Er pos,Pr neg, Her 2 neg.  . Lung cancer, upper lobe 2014    right upper lobe  . Breast cancer     PAST SURGICAL HISTORY: Past Surgical History  Procedure Laterality Date  . Intercostal nerve block  2005  . Cataract extraction w/ intraocular lens implant  2005  . Carpal tunnel release Right 2011  . Insertion central venous access device w/ subcutaneous port  12-07-12  . Breast lumpectomy Left 2001  . Breast surgery Right 2014    total mastectomy  . Right lung upper lobectomy  03/2013  . Lobectomy Right   . Cataract extraction    . Mastectomy Right     FAMILY HISTORY Family History  Problem Relation Age of Onset  . Diabetes Other   . Hyperlipidemia Other   . Hypertension Other         ADVANCED DIRECTIVES: Patient does not have any advanced healthcare directive. Information has been given.   HEALTH MAINTENANCE: History  Substance Use Topics  . Smoking status: Former Smoker -- 0.50 packs/day for 30 years    Types: Cigarettes    Quit date: 02/03/2013  . Smokeless tobacco: Never Used     Comment: using 46m nicorette gum- 1/2 piece, 10 times per day  . Alcohol Use: No      Allergies  Allergen Reactions  . Metformin Diarrhea and Nausea Only  . Other Rash    Sage Wipes    Current Outpatient Prescriptions  Medication Sig Dispense Refill    . acetaminophen (TYLENOL) 325 MG tablet Take 650 mg by mouth every 6 (six) hours as needed for pain.    .Marland Kitchengabapentin (NEURONTIN) 300 MG capsule Take 300 mg by mouth as needed.    .Marland KitchenglipiZIDE (GLUCOTROL XL) 5 MG 24 hr tablet Take by mouth.    . hydrochlorothiazide (HYDRODIURIL) 25 MG tablet Take 12.5 mg by mouth daily.     .Marland Kitchenletrozole (FEMARA) 2.5 MG tablet Take 2.5 mg by mouth daily.    . Multiple Vitamins-Minerals (MULTIVITAMIN PO) Take by mouth.    . naproxen sodium (ANAPROX) 220 MG tablet Take 220 mg by mouth every 8 (eight) hours as needed.    . nicotine polacrilex (NICORETTE) 2 MG gum Take 2 mg by mouth 5 (five) times daily.     . ondansetron (ZOFRAN) 4 MG tablet Take 1 tablet (4 mg total) by mouth every 6 (six) hours as needed for nausea or vomiting. 30 tablet 3  . PARoxetine Mesylate (BRISDELLE PO) Take 1 tablet by mouth at bedtime.    .Marland Kitchen  potassium chloride SA (K-DUR,KLOR-CON) 20 MEQ tablet Take 1 tablet (20 mEq total) by mouth daily. 30 tablet 0  . simvastatin (ZOCOR) 10 MG tablet Take 10 mg by mouth daily.     No current facility-administered medications for this visit.   Facility-Administered Medications Ordered in Other Visits  Medication Dose Route Frequency Provider Last Rate Last Dose  . sodium chloride 0.9 % injection 10 mL  10 mL Intracatheter PRN Forest Gleason, MD   10 mL at 02/04/15 1027    OBJECTIVE:  Filed Vitals:   03/27/15 0947  BP: 104/70  Pulse: 93  Temp: 98.1 F (36.7 C)     Body mass index is 25.6 kg/(m^2).    ECOG FS:0 - Asymptomatic  PHYSICAL EXAM: GENERAL:  Well developed, well nourished, sitting comfortably in the exam room in no acute distress. MENTAL STATUS:  Alert and oriented to person, place and time. HEAD:   Normocephalic, atraumatic, face symmetric, no Cushingoid features. EYES:    Pupils equal round and reactive to light and accomodation.  No conjunctivitis or scleral icterus. ENT:  Oropharynx clear without lesion.  Tongue normal. Mucous  membranes moist.  RESPIRATORY:  Clear to auscultation without rales, wheezes or rhonchi. CARDIOVASCULAR:  Regular rate and rhythm without murmur, rub or gallop. BREAST:  Right breast : Status post mastectomy.  No evidence of recurrent disease skin changes or nipple discharge.  Left breast without masses, skin changes or nipple discharge. ABDOMEN:  Soft, non-tender, with active bowel sounds, and no hepatosplenomegaly.  No masses. BACK:  No CVA tenderness.  No tenderness on percussion of the back or rib cage. SKIN:  No rashes, ulcers or lesions. EXTREMITIES: No edema, no skin discoloration or tenderness.  No palpable cords. LYMPH NODES: No palpable cervical, supraclavicular, axillary or inguinal adenopathy  NEUROLOGICAL: Unremarkable. PSYCH:  Appropriate.   LAB RESULTS:  Infusion on 03/27/2015  Component Date Value Ref Range Status  . WBC 03/27/2015 6.0  3.6 - 11.0 K/uL Final   A-LINE DRAW  . RBC 03/27/2015 3.07* 3.80 - 5.20 MIL/uL Final  . Hemoglobin 03/27/2015 9.5* 12.0 - 16.0 g/dL Final  . HCT 03/27/2015 28.3* 35.0 - 47.0 % Final  . MCV 03/27/2015 92.1  80.0 - 100.0 fL Final  . MCH 03/27/2015 30.8  26.0 - 34.0 pg Final  . MCHC 03/27/2015 33.5  32.0 - 36.0 g/dL Final  . RDW 03/27/2015 15.1* 11.5 - 14.5 % Final  . Platelets 03/27/2015 348  150 - 440 K/uL Final  . Neutrophils Relative % 03/27/2015 80   Final  . Neutro Abs 03/27/2015 4.8  1.4 - 6.5 K/uL Final  . Lymphocytes Relative 03/27/2015 6   Final  . Lymphs Abs 03/27/2015 0.4* 1.0 - 3.6 K/uL Final  . Monocytes Relative 03/27/2015 12   Final  . Monocytes Absolute 03/27/2015 0.7  0.2 - 0.9 K/uL Final  . Eosinophils Relative 03/27/2015 1   Final  . Eosinophils Absolute 03/27/2015 0.1  0 - 0.7 K/uL Final  . Basophils Relative 03/27/2015 1   Final  . Basophils Absolute 03/27/2015 0.1  0 - 0.1 K/uL Final  . Sodium 03/27/2015 132* 135 - 145 mmol/L Final  . Potassium 03/27/2015 3.7  3.5 - 5.1 mmol/L Final  . Chloride 03/27/2015 99*  101 - 111 mmol/L Final  . CO2 03/27/2015 27  22 - 32 mmol/L Final  . Glucose, Bld 03/27/2015 157* 65 - 99 mg/dL Final  . BUN 03/27/2015 12  6 - 20 mg/dL Final  . Creatinine,  Ser 03/27/2015 0.78  0.44 - 1.00 mg/dL Final  . Calcium 03/27/2015 8.6* 8.9 - 10.3 mg/dL Final  . Total Protein 03/27/2015 7.6  6.5 - 8.1 g/dL Final  . Albumin 03/27/2015 3.2* 3.5 - 5.0 g/dL Final  . AST 03/27/2015 21  15 - 41 U/L Final  . ALT 03/27/2015 17  14 - 54 U/L Final  . Alkaline Phosphatase 03/27/2015 129* 38 - 126 U/L Final  . Total Bilirubin 03/27/2015 0.5  0.3 - 1.2 mg/dL Final  . GFR calc non Af Amer 03/27/2015 >60  >60 mL/min Final  . GFR calc Af Amer 03/27/2015 >60  >60 mL/min Final   Comment: (NOTE) The eGFR has been calculated using the CKD EPI equation. This calculation has not been validated in all clinical situations. eGFR's persistently <60 mL/min signify possible Chronic Kidney Disease.   . Anion gap 03/27/2015 6  5 - 15 Final    Lab Results  Component Value Date   LABCA2 20.3 06/11/2014     STUDIES: Mr Jeri Cos ZO Contrast  March 23, 2015   CLINICAL DATA:  Recently diagnosed stage III lung cancer. Personal history of breast cancer.  EXAM: MRI HEAD WITHOUT AND WITH CONTRAST  TECHNIQUE: Multiplanar, multiecho pulse sequences of the brain and surrounding structures were obtained without and with intravenous contrast.  CONTRAST:  65m MULTIHANCE GADOBENATE DIMEGLUMINE 529 MG/ML IV SOLN  COMPARISON:  None.  FINDINGS: There is no evidence of acute infarct, intracranial hemorrhage, mass, midline shift, or extra-axial fluid collection. Ventricles and sulci are normal. There are a few punctate foci of T2 hyperintensity in the subcortical cerebral white matter, nonspecific and not greater than expected for patient's age. No abnormal enhancement is identified.  Prior bilateral cataract extraction is noted. Mild bilateral ethmoid and left maxillary sinus mucosal thickening is noted. No significant mastoid  effusion. Major intracranial vascular flow voids are preserved.  IMPRESSION: Unremarkable appearance of the brain for age. No evidence of intracranial metastatic disease.   Electronically Signed   By: ALogan Bores  On: [id number]:26   Mm Digital Screening Unilat L  03/18/2015   CLINICAL DATA:  Screening.  EXAM: DIGITAL SCREENING UNILATERAL LEFT MAMMOGRAM WITH CAD  COMPARISON:  Previous exam(s).  ACR Breast Density Category c: The breast tissue is heterogeneously dense, which may obscure small masses.  FINDINGS: There are no findings suspicious for malignancy. Images were processed with CAD.  IMPRESSION: No mammographic evidence of malignancy. A result letter of this screening mammogram will be mailed directly to the patient.  RECOMMENDATION: Screening mammogram in one year. (Code:SM-B-01Y)  BI-RADS CATEGORY  1: Negative.   Electronically Signed   By: DLovey NewcomerM.D.   On: 03/18/2015 12:39    ASSESSMENT: 1 left upper lobe lung mass with mediastinal lymph node along with contralateral hilar lymphadenopathy both positive for metastatic adenocarcinoma T1 N3 M0 tumor stage IIIB 2.  Previous history of right upper lobe lung cancer status post resection T1 N0 M0 tumor 3.  History of carcinoma breast.  Right breast cancer status post chemoradiation therapy on anti-hormonal therapy. Previous history of left breast cancer  MEDICAL DECISION MAKING:   All lab data has been reviewed and will continue chemotherapy cycle 4 and 5.  And patient will be reevaluated at cycle 6.  Patient is undergoing split course of radiation therapy Patient expressed understanding and was in agreement with this plan. She also understands that She can call clinic at any time with any questions, concerns, or complaints.  Patient  was explained all the side effects of chemotherapy.  And informed consent has been obtained   Malignant neoplasm of female breast   Staging form: Breast, AJCC 7th Edition     Clinical: Stage IIIA (T3,  N1, M0) - Signed by Forest Gleason, MD on 01/26/2015 Malignant neoplasm of upper lobe, bronchus or lung   Staging form: Lung, AJCC 7th Edition     Clinical: Stage IA (T1b, N0, M0) - Signed by Forest Gleason, MD on 01/26/2015   Forest Gleason, MD   03/27/2015 10:15 AM

## 2015-03-28 ENCOUNTER — Ambulatory Visit
Admission: RE | Admit: 2015-03-28 | Discharge: 2015-03-28 | Disposition: A | Discharge status: Home / Self Care | Payer: 59 | Source: Ambulatory Visit | Attending: Radiation Oncology | Admitting: Radiation Oncology

## 2015-03-28 DIAGNOSIS — Z510 Encounter for antineoplastic radiation therapy: Secondary | ICD-10-CM | POA: Diagnosis not present

## 2015-03-29 ENCOUNTER — Ambulatory Visit
Admission: RE | Admit: 2015-03-29 | Discharge: 2015-03-29 | Disposition: A | Discharge status: Home / Self Care | Payer: 59 | Source: Ambulatory Visit | Attending: Radiation Oncology | Admitting: Radiation Oncology

## 2015-03-29 DIAGNOSIS — Z510 Encounter for antineoplastic radiation therapy: Secondary | ICD-10-CM | POA: Diagnosis not present

## 2015-04-01 ENCOUNTER — Ambulatory Visit
Admission: RE | Admit: 2015-04-01 | Discharge: 2015-04-01 | Disposition: A | Discharge status: Home / Self Care | Payer: 59 | Source: Ambulatory Visit | Attending: Radiation Oncology | Admitting: Radiation Oncology

## 2015-04-01 DIAGNOSIS — Z510 Encounter for antineoplastic radiation therapy: Secondary | ICD-10-CM | POA: Diagnosis not present

## 2015-04-01 DEATH — deceased

## 2015-04-02 ENCOUNTER — Ambulatory Visit
Admission: RE | Admit: 2015-04-02 | Discharge: 2015-04-02 | Disposition: A | Discharge status: Home / Self Care | Payer: 59 | Source: Ambulatory Visit | Attending: Radiation Oncology | Admitting: Radiation Oncology

## 2015-04-02 DIAGNOSIS — Z510 Encounter for antineoplastic radiation therapy: Secondary | ICD-10-CM | POA: Diagnosis not present

## 2015-04-03 ENCOUNTER — Other Ambulatory Visit: Payer: Self-pay | Admitting: Family Medicine

## 2015-04-03 ENCOUNTER — Ambulatory Visit
Admission: RE | Admit: 2015-04-03 | Discharge: 2015-04-03 | Disposition: A | Discharge status: Home / Self Care | Payer: 59 | Source: Ambulatory Visit | Attending: Radiation Oncology | Admitting: Radiation Oncology

## 2015-04-03 ENCOUNTER — Inpatient Hospital Stay: Payer: 59

## 2015-04-03 ENCOUNTER — Telehealth: Payer: Self-pay | Admitting: *Deleted

## 2015-04-03 ENCOUNTER — Inpatient Hospital Stay: Payer: 59 | Attending: Oncology

## 2015-04-03 VITALS — BP 100/66 | HR 94 | Temp 96.5°F | Resp 18

## 2015-04-03 DIAGNOSIS — Z79811 Long term (current) use of aromatase inhibitors: Secondary | ICD-10-CM | POA: Insufficient documentation

## 2015-04-03 DIAGNOSIS — E119 Type 2 diabetes mellitus without complications: Secondary | ICD-10-CM | POA: Insufficient documentation

## 2015-04-03 DIAGNOSIS — I10 Essential (primary) hypertension: Secondary | ICD-10-CM | POA: Insufficient documentation

## 2015-04-03 DIAGNOSIS — C3490 Malignant neoplasm of unspecified part of unspecified bronchus or lung: Secondary | ICD-10-CM

## 2015-04-03 DIAGNOSIS — Z170 Estrogen receptor positive status [ER+]: Secondary | ICD-10-CM | POA: Insufficient documentation

## 2015-04-03 DIAGNOSIS — C3400 Malignant neoplasm of unspecified main bronchus: Secondary | ICD-10-CM

## 2015-04-03 DIAGNOSIS — C50911 Malignant neoplasm of unspecified site of right female breast: Secondary | ICD-10-CM | POA: Diagnosis not present

## 2015-04-03 DIAGNOSIS — Z87891 Personal history of nicotine dependence: Secondary | ICD-10-CM | POA: Insufficient documentation

## 2015-04-03 DIAGNOSIS — Z79899 Other long term (current) drug therapy: Secondary | ICD-10-CM | POA: Diagnosis not present

## 2015-04-03 DIAGNOSIS — Z5111 Encounter for antineoplastic chemotherapy: Secondary | ICD-10-CM | POA: Insufficient documentation

## 2015-04-03 DIAGNOSIS — Z9011 Acquired absence of right breast and nipple: Secondary | ICD-10-CM | POA: Diagnosis not present

## 2015-04-03 DIAGNOSIS — Z510 Encounter for antineoplastic radiation therapy: Secondary | ICD-10-CM | POA: Diagnosis not present

## 2015-04-03 DIAGNOSIS — C3410 Malignant neoplasm of upper lobe, unspecified bronchus or lung: Secondary | ICD-10-CM | POA: Insufficient documentation

## 2015-04-03 DIAGNOSIS — C779 Secondary and unspecified malignant neoplasm of lymph node, unspecified: Secondary | ICD-10-CM | POA: Diagnosis not present

## 2015-04-03 LAB — CBC WITH DIFFERENTIAL/PLATELET
BASOS ABS: 0 10*3/uL (ref 0–0.1)
Basophils Relative: 1 %
EOS PCT: 1 %
Eosinophils Absolute: 0 10*3/uL (ref 0–0.7)
HCT: 30 % — ABNORMAL LOW (ref 35.0–47.0)
Hemoglobin: 10.2 g/dL — ABNORMAL LOW (ref 12.0–16.0)
LYMPHS ABS: 0.5 10*3/uL — AB (ref 1.0–3.6)
LYMPHS PCT: 8 %
MCH: 31.3 pg (ref 26.0–34.0)
MCHC: 33.9 g/dL (ref 32.0–36.0)
MCV: 92.4 fL (ref 80.0–100.0)
MONO ABS: 0.6 10*3/uL (ref 0.2–0.9)
MONOS PCT: 9 %
Neutro Abs: 5.2 10*3/uL (ref 1.4–6.5)
Neutrophils Relative %: 81 %
Platelets: 232 10*3/uL (ref 150–440)
RBC: 3.25 MIL/uL — ABNORMAL LOW (ref 3.80–5.20)
RDW: 15.4 % — ABNORMAL HIGH (ref 11.5–14.5)
WBC: 6.4 10*3/uL (ref 3.6–11.0)

## 2015-04-03 LAB — MAGNESIUM: Magnesium: 1.7 mg/dL (ref 1.7–2.4)

## 2015-04-03 LAB — BASIC METABOLIC PANEL
Anion gap: 6 (ref 5–15)
BUN: 12 mg/dL (ref 6–20)
CHLORIDE: 101 mmol/L (ref 101–111)
CO2: 27 mmol/L (ref 22–32)
Calcium: 8.5 mg/dL — ABNORMAL LOW (ref 8.9–10.3)
Creatinine, Ser: 0.75 mg/dL (ref 0.44–1.00)
GFR calc Af Amer: >60 mL/min (ref >60)
GLUCOSE: 144 mg/dL — AB (ref 65–99)
Potassium: 2.9 mmol/L — CL (ref 3.5–5.1)
Sodium: 134 mmol/L — ABNORMAL LOW (ref 135–145)

## 2015-04-03 MED ORDER — FAMOTIDINE IN NACL 20-0.9 MG/50ML-% IV SOLN
20.0000 mg | Freq: Once | INTRAVENOUS | Status: AC
  Administered 2015-04-03: 20 mg via INTRAVENOUS
  Filled 2015-04-03: qty 50

## 2015-04-03 MED ORDER — SODIUM CHLORIDE 0.9 % IJ SOLN
10.0000 mL | INTRAMUSCULAR | Status: DC | PRN
  Administered 2015-04-03: 10 mL via INTRAVENOUS
  Filled 2015-04-03: qty 10

## 2015-04-03 MED ORDER — SODIUM CHLORIDE 0.9 % IV SOLN
Freq: Once | INTRAVENOUS | Status: AC
  Administered 2015-04-03: 12:00:00 via INTRAVENOUS
  Filled 2015-04-03: qty 8

## 2015-04-03 MED ORDER — PACLITAXEL CHEMO INJECTION 300 MG/50ML
45.0000 mg/m2 | Freq: Once | INTRAVENOUS | Status: AC
  Administered 2015-04-03: 78 mg via INTRAVENOUS
  Filled 2015-04-03: qty 13

## 2015-04-03 MED ORDER — SODIUM CHLORIDE 0.9 % IV SOLN
Freq: Once | INTRAVENOUS | Status: AC
  Administered 2015-04-03: 11:00:00 via INTRAVENOUS
  Filled 2015-04-03: qty 100

## 2015-04-03 MED ORDER — HEPARIN SOD (PORK) LOCK FLUSH 100 UNIT/ML IV SOLN
500.0000 [IU] | Freq: Once | INTRAVENOUS | Status: AC
  Administered 2015-04-03: 500 [IU] via INTRAVENOUS
  Filled 2015-04-03: qty 5

## 2015-04-03 MED ORDER — POTASSIUM CHLORIDE 20 MEQ/100ML IV SOLN: 20.0000 meq | Freq: Once | INTRAVENOUS | Status: DC

## 2015-04-03 MED ORDER — DIPHENHYDRAMINE HCL 50 MG/ML IJ SOLN
25.0000 mg | Freq: Once | INTRAMUSCULAR | Status: AC
  Administered 2015-04-03: 25 mg via INTRAVENOUS
  Filled 2015-04-03: qty 1

## 2015-04-03 MED ORDER — SODIUM CHLORIDE 0.9 % IV SOLN
215.0000 mg | Freq: Once | INTRAVENOUS | Status: AC
  Administered 2015-04-03: 220 mg via INTRAVENOUS
  Filled 2015-04-03: qty 22

## 2015-04-03 MED ORDER — SODIUM CHLORIDE 0.9 % IV SOLN
Freq: Once | INTRAVENOUS | Status: AC
  Administered 2015-04-03: 10:00:00 via INTRAVENOUS
  Filled 2015-04-03: qty 1000

## 2015-04-03 NOTE — Telephone Encounter (Signed)
Critical K+ 2.9  MD notified.

## 2015-04-04 ENCOUNTER — Ambulatory Visit
Admission: RE | Admit: 2015-04-04 | Discharge: 2015-04-04 | Disposition: A | Discharge status: Home / Self Care | Payer: 59 | Source: Ambulatory Visit | Attending: Radiation Oncology | Admitting: Radiation Oncology

## 2015-04-04 DIAGNOSIS — Z510 Encounter for antineoplastic radiation therapy: Secondary | ICD-10-CM | POA: Diagnosis not present

## 2015-04-05 ENCOUNTER — Ambulatory Visit
Admission: RE | Admit: 2015-04-05 | Discharge: 2015-04-05 | Disposition: A | Discharge status: Home / Self Care | Payer: 59 | Source: Ambulatory Visit | Attending: Radiation Oncology | Admitting: Radiation Oncology

## 2015-04-05 DIAGNOSIS — Z510 Encounter for antineoplastic radiation therapy: Secondary | ICD-10-CM | POA: Diagnosis not present

## 2015-04-08 ENCOUNTER — Ambulatory Visit
Admission: RE | Admit: 2015-04-08 | Discharge: 2015-04-08 | Disposition: A | Discharge status: Home / Self Care | Payer: 59 | Source: Ambulatory Visit | Attending: Radiation Oncology | Admitting: Radiation Oncology

## 2015-04-08 ENCOUNTER — Other Ambulatory Visit: Payer: Self-pay

## 2015-04-08 ENCOUNTER — Encounter: Payer: Self-pay | Admitting: General Surgery

## 2015-04-08 ENCOUNTER — Ambulatory Visit (INDEPENDENT_AMBULATORY_CARE_PROVIDER_SITE_OTHER): Payer: 59 | Admitting: General Surgery

## 2015-04-08 VITALS — BP 132/76 | HR 85 | Resp 16 | Ht 62.0 in | Wt 145.0 lb

## 2015-04-08 DIAGNOSIS — Z853 Personal history of malignant neoplasm of breast: Secondary | ICD-10-CM | POA: Diagnosis not present

## 2015-04-08 DIAGNOSIS — Z510 Encounter for antineoplastic radiation therapy: Secondary | ICD-10-CM | POA: Diagnosis not present

## 2015-04-08 NOTE — Progress Notes (Signed)
Patient ID: Brittney Lam, female   DOB: September 19, 1960, 54 y.o.   MRN: [id number]  Chief Complaint  Patient presents with  . Follow-up    left breast mammomgram    HPI [name] is a 54 y.o. female here today for who presents for a breast and colon cancer follow up. The most recent mammogram was done on 03/28/15.  Patient does perform regular self breast checks and gets regular mammograms done. Patient reports no new breast problems. Currently being treated for hilar recurrence of lung cancer by Dr. Oliva Bustard, started radiaton and chemotherapy March 05 2015.   HPI  Past Medical History  Diagnosis Date  . Unspecified essential hypertension   . Kidney failure   . Personal history of tobacco use, presenting hazards to health   . Cataract   . Personal history of malignant neoplasm of breast   . Breast screening, unspecified   . Special screening for malignant neoplasms, colon   . Diabetes mellitus without complication   . MDRO (multiple drug resistant organisms) resistance   . H. pylori duodenitis   . Complication of anesthesia     BP DROPPED DURING LOBECTOMY IN 2014  . Cancer 2001    left breast cancer s/p L/SN/R in 2001  . Cancer 2014    right breast invasive CA, Er pos,Pr neg, Her 2 neg.  . Lung cancer, upper lobe 2014    right upper lobe  . Breast cancer     Past Surgical History  Procedure Laterality Date  . Intercostal nerve block  2005  . Cataract extraction w/ intraocular lens implant  2005  . Carpal tunnel release Right 2011  . Insertion central venous access device w/ subcutaneous port  12-07-12  . Breast lumpectomy Left 2001  . Breast surgery Right 2014    total mastectomy  . Right lung upper lobectomy  03/2013  . Lobectomy Right   . Cataract extraction    . Mastectomy Right   . Colonoscopy      Family History  Problem Relation Age of Onset  . Diabetes Other   . Hyperlipidemia Other   . Hypertension Other     Social History History  Substance Use  Topics  . Smoking status: Former Smoker -- 0.50 packs/day for 30 years    Types: Cigarettes    Quit date: 02/03/2013  . Smokeless tobacco: Never Used     Comment: using '2mg'$  nicorette gum- 1/2 piece, 10 times per day  . Alcohol Use: No    Allergies  Allergen Reactions  . Metformin Diarrhea and Nausea Only  . Other Rash    Sage Wipes    Current Outpatient Prescriptions  Medication Sig Dispense Refill  . acetaminophen (TYLENOL) 325 MG tablet Take 650 mg by mouth every 6 (six) hours as needed for pain.    Marland Kitchen dexamethasone (DECADRON) 4 MG tablet Take 1 tablet (4 mg total) by mouth daily. 13 tablet 0  . esomeprazole (NEXIUM) 20 MG capsule Take 1 capsule (20 mg total) by mouth every morning. 30 capsule 0  . gabapentin (NEURONTIN) 300 MG capsule Take 300 mg by mouth as needed.    Marland Kitchen glipiZIDE (GLUCOTROL XL) 5 MG 24 hr tablet Take by mouth.    . hydrochlorothiazide (HYDRODIURIL) 25 MG tablet Take 12.5 mg by mouth daily.     . Multiple Vitamins-Minerals (MULTIVITAMIN PO) Take by mouth.    . naproxen sodium (ANAPROX) 220 MG tablet Take 220 mg by mouth every 8 (eight) hours as  needed.    . nicotine polacrilex (NICORETTE) 2 MG gum Take 2 mg by mouth 5 (five) times daily.     . ondansetron (ZOFRAN) 4 MG tablet Take 1 tablet (4 mg total) by mouth every 6 (six) hours as needed for nausea or vomiting. 30 tablet 3  . PARoxetine Mesylate (BRISDELLE PO) Take 1 tablet by mouth at bedtime.    . potassium chloride SA (K-DUR,KLOR-CON) 20 MEQ tablet Take 1 tablet (20 mEq total) by mouth daily. 30 tablet 0  . simvastatin (ZOCOR) 10 MG tablet Take 10 mg by mouth daily.     No current facility-administered medications for this visit.   Facility-Administered Medications Ordered in Other Visits  Medication Dose Route Frequency Provider Last Rate Last Dose  . sodium chloride 0.9 % injection 10 mL  10 mL Intracatheter PRN Forest Gleason, MD   10 mL at 02/04/15 1027    Review of Systems Review of Systems   Constitutional: Positive for fatigue.  Respiratory: Positive for cough and wheezing.   Cardiovascular: Negative.     Blood pressure 132/76, pulse 85, resp. rate 16, height '5\' 2"'$  (1.575 m), weight 145 lb (65.772 kg).  Physical Exam Physical Exam  Constitutional: She is oriented to person, place, and time. She appears well-developed and well-nourished.  Eyes: Conjunctivae are normal. No scleral icterus.  Neck: Neck supple.  Cardiovascular: Normal rate, regular rhythm and normal heart sounds.   Pulmonary/Chest: Effort normal and breath sounds normal. Right breast exhibits no inverted nipple, no mass, no nipple discharge, no skin change and no tenderness.  Abdominal: Soft. Bowel sounds are normal. There is no tenderness.  Lymphadenopathy:    She has no cervical adenopathy.    She has no axillary adenopathy.  Neurological: She is alert and oriented to person, place, and time.  Skin: Skin is warm and dry.    Data Reviewed Mammogram reviewed  Assessment    Pt with prior left breast cancer, recent right breast CA and right lung CA. She has had recurrence of lung CA in hilar region.  Appears to be doing well with current treatment. Only c/o mild swallowing discomfort resulting from radiation.    Plan    The patient has been asked to return to the office in one year with a left srceening  mammogram.      PCP: Dr. Dannial Monarch 04/08/2015, 4:14 PM

## 2015-04-08 NOTE — Patient Instructions (Signed)
he patient has been asked to return to the office in one year with a left srceening  mammogram.

## 2015-04-08 NOTE — Patient Outreach (Signed)
[facility]) Care Management  04/08/2015  TISHAWNA LAROUCHE [birth year] [id number]   I called Sarha to inquire about her health and diabetes control- a female answered the phone and I have asked him to have her call me.    [name], RN, BA, [place], [place] Direct Dial:  [phone]  Fax:  [phone] E-mail: [email or web address] [address] [place], [place]  [address]

## 2015-04-09 ENCOUNTER — Ambulatory Visit
Admission: RE | Admit: 2015-04-09 | Discharge: 2015-04-09 | Disposition: A | Discharge status: Home / Self Care | Payer: 59 | Source: Ambulatory Visit | Attending: Radiation Oncology | Admitting: Radiation Oncology

## 2015-04-09 DIAGNOSIS — Z510 Encounter for antineoplastic radiation therapy: Secondary | ICD-10-CM | POA: Diagnosis not present

## 2015-04-10 ENCOUNTER — Inpatient Hospital Stay: Payer: 59

## 2015-04-10 ENCOUNTER — Encounter: Payer: Self-pay | Admitting: Oncology

## 2015-04-10 ENCOUNTER — Inpatient Hospital Stay (HOSPITAL_BASED_OUTPATIENT_CLINIC_OR_DEPARTMENT_OTHER): Payer: 59 | Admitting: Oncology

## 2015-04-10 ENCOUNTER — Ambulatory Visit: Payer: 59 | Admitting: General Surgery

## 2015-04-10 ENCOUNTER — Ambulatory Visit
Admission: RE | Admit: 2015-04-10 | Discharge: 2015-04-10 | Disposition: A | Discharge status: Home / Self Care | Payer: 59 | Source: Ambulatory Visit | Attending: Radiation Oncology | Admitting: Radiation Oncology

## 2015-04-10 VITALS — BP 113/69 | HR 84 | Temp 97.5°F | Wt 145.5 lb

## 2015-04-10 DIAGNOSIS — Z510 Encounter for antineoplastic radiation therapy: Secondary | ICD-10-CM | POA: Diagnosis not present

## 2015-04-10 DIAGNOSIS — Z170 Estrogen receptor positive status [ER+]: Secondary | ICD-10-CM

## 2015-04-10 DIAGNOSIS — C3490 Malignant neoplasm of unspecified part of unspecified bronchus or lung: Secondary | ICD-10-CM

## 2015-04-10 DIAGNOSIS — C50911 Malignant neoplasm of unspecified site of right female breast: Secondary | ICD-10-CM

## 2015-04-10 DIAGNOSIS — Z5111 Encounter for antineoplastic chemotherapy: Secondary | ICD-10-CM | POA: Diagnosis not present

## 2015-04-10 DIAGNOSIS — C3410 Malignant neoplasm of upper lobe, unspecified bronchus or lung: Secondary | ICD-10-CM

## 2015-04-10 DIAGNOSIS — Z79811 Long term (current) use of aromatase inhibitors: Secondary | ICD-10-CM

## 2015-04-10 DIAGNOSIS — Z79899 Other long term (current) drug therapy: Secondary | ICD-10-CM

## 2015-04-10 DIAGNOSIS — C779 Secondary and unspecified malignant neoplasm of lymph node, unspecified: Secondary | ICD-10-CM

## 2015-04-10 DIAGNOSIS — C3400 Malignant neoplasm of unspecified main bronchus: Secondary | ICD-10-CM

## 2015-04-10 LAB — CBC WITH DIFFERENTIAL/PLATELET
BASOS ABS: 0 10*3/uL (ref 0–0.1)
BASOS PCT: 1 %
EOS PCT: 1 %
Eosinophils Absolute: 0 10*3/uL (ref 0–0.7)
HEMATOCRIT: 28.5 % — AB (ref 35.0–47.0)
Hemoglobin: 9.8 g/dL — ABNORMAL LOW (ref 12.0–16.0)
Lymphocytes Relative: 7 %
Lymphs Abs: 0.3 10*3/uL — ABNORMAL LOW (ref 1.0–3.6)
MCH: 31.8 pg (ref 26.0–34.0)
MCHC: 34.3 g/dL (ref 32.0–36.0)
MCV: 92.7 fL (ref 80.0–100.0)
MONO ABS: 0.5 10*3/uL (ref 0.2–0.9)
Monocytes Relative: 10 %
Neutro Abs: 4.1 10*3/uL (ref 1.4–6.5)
Neutrophils Relative %: 81 %
Platelets: 158 10*3/uL (ref 150–440)
RBC: 3.08 MIL/uL — ABNORMAL LOW (ref 3.80–5.20)
RDW: 16.9 % — AB (ref 11.5–14.5)
WBC: 5 10*3/uL (ref 3.6–11.0)

## 2015-04-10 LAB — COMPREHENSIVE METABOLIC PANEL
ALT: 33 U/L (ref 14–54)
AST: 28 U/L (ref 15–41)
Albumin: 3.1 g/dL — ABNORMAL LOW (ref 3.5–5.0)
Alkaline Phosphatase: 118 U/L (ref 38–126)
Anion gap: 6 (ref 5–15)
BILIRUBIN TOTAL: 0.3 mg/dL (ref 0.3–1.2)
BUN: 10 mg/dL (ref 6–20)
CO2: 26 mmol/L (ref 22–32)
CREATININE: 0.72 mg/dL (ref 0.44–1.00)
Calcium: 8.6 mg/dL — ABNORMAL LOW (ref 8.9–10.3)
Chloride: 102 mmol/L (ref 101–111)
GFR calc Af Amer: >60 mL/min (ref >60)
GFR calc non Af Amer: >60 mL/min (ref >60)
Glucose, Bld: 231 mg/dL — ABNORMAL HIGH (ref 65–99)
Potassium: 3.2 mmol/L — ABNORMAL LOW (ref 3.5–5.1)
Sodium: 134 mmol/L — ABNORMAL LOW (ref 135–145)
Total Protein: 7 g/dL (ref 6.5–8.1)

## 2015-04-10 MED ORDER — FAMOTIDINE IN NACL 20-0.9 MG/50ML-% IV SOLN
20.0000 mg | Freq: Once | INTRAVENOUS | Status: AC
  Administered 2015-04-10: 20 mg via INTRAVENOUS
  Filled 2015-04-10: qty 50

## 2015-04-10 MED ORDER — HEPARIN SOD (PORK) LOCK FLUSH 100 UNIT/ML IV SOLN
500.0000 [IU] | Freq: Once | INTRAVENOUS | Status: AC | PRN
  Administered 2015-04-10: 500 [IU]
  Filled 2015-04-10: qty 5

## 2015-04-10 MED ORDER — SODIUM CHLORIDE 0.9 % IV SOLN
Freq: Once | INTRAVENOUS | Status: AC
  Administered 2015-04-10: 10:00:00 via INTRAVENOUS
  Filled 2015-04-10: qty 1000

## 2015-04-10 MED ORDER — SODIUM CHLORIDE 0.9 % IV SOLN
215.0000 mg | Freq: Once | INTRAVENOUS | Status: AC
  Administered 2015-04-10: 220 mg via INTRAVENOUS
  Filled 2015-04-10: qty 22

## 2015-04-10 MED ORDER — SODIUM CHLORIDE 0.9 % IJ SOLN
10.0000 mL | INTRAMUSCULAR | Status: DC | PRN
  Administered 2015-04-10: 10 mL
  Filled 2015-04-10: qty 10

## 2015-04-10 MED ORDER — DIPHENHYDRAMINE HCL 50 MG/ML IJ SOLN
25.0000 mg | Freq: Once | INTRAMUSCULAR | Status: AC
  Administered 2015-04-10: 25 mg via INTRAVENOUS
  Filled 2015-04-10: qty 1

## 2015-04-10 MED ORDER — PACLITAXEL CHEMO INJECTION 300 MG/50ML
45.0000 mg/m2 | Freq: Once | INTRAVENOUS | Status: AC
  Administered 2015-04-10: 78 mg via INTRAVENOUS
  Filled 2015-04-10: qty 13

## 2015-04-10 MED ORDER — SODIUM CHLORIDE 0.9 % IV SOLN
Freq: Once | INTRAVENOUS | Status: AC
  Administered 2015-04-10: 10:00:00 via INTRAVENOUS
  Filled 2015-04-10: qty 8

## 2015-04-10 NOTE — Progress Notes (Signed)
Patient does not have living will.  Former smoker. 

## 2015-04-10 NOTE — Progress Notes (Signed)
[place] @ [facility] [phone]  Fax:([phone]) [place]: [phone]  MR#: [id number]  NOM#:[id number]  Patient Care Team: [name], MD as PCP - General (Family Medicine) [name], MD (General Surgery) [name], RN as [facility] Management [name], MD as Referring Physician (Family Medicine)  CHIEF COMPLAINT:  Chief Complaint  Patient presents with  . Follow-up    Oncology History   46. 54 year old female status post recent left breast cancer in 2005  2. Right breast cancer, locally advanced stage IIIa (pyT3, N1, M0), invasive mammary carcinoma with mucin production. Status post right modified radical mastectomy for 8 cm lesion with one of 4 sentinel lymph nodes positive for metastatic disease. Tumor is ER positive PR negative HER-2/neu not overexpressed. 3. Completed neoadjuvant chemotherapy with chest wall radiation therapy,  Finished on October 21, 2013. 4. Started letrazole 2.5 mg by mouth daily from October 24, 2013. 5. Carcinoma of lung.  Right upper lobe, status post right upper lobectomy, T1BN0M0. 6.  Left upper lobe mass with mediastinal lymph node biopsies positive for poorly differentiated adenocarcinoma T1 N1 M0 tumor III Starting chemoradiation therapy in July of 2016 (second primary) Patient had EUS in June of 2016.  Bilateral hilar adenopathy which has been biopsied was positive for metastatic adenocarcinoma consistent with lung primary Starting chemoradiation therapy in July of 2016 7.  Has finished 6 cycles of chemotherapy with carboplatinum and Taxol concurrent with radiation therapy on April 10, 2015     Malignant neoplasm of upper lobe, bronchus or lung   04/03/2013 Initial Diagnosis Malignant neoplasm of upper lobe, bronchus or lung    Malignant neoplasm of female breast   04/03/2013 Initial Diagnosis Malignant neoplasm of female breast    Lung cancer   03/05/2015 Initial  Diagnosis Lung cancer   54year-old lady with second primary left upper lobe lung cancer with mediastinal lymph node.  Bilateral lymph nodes were positive for metastatic adenocarcinoma.  Patient had left upper lobe lung mass.  Here for further follow-up and treatment consideration. Patient also has occasional headaches with MRI scan of head has been ordered and will be reviewed.  Patient is also being evaluated by Dr. Baruch Gouty   INTERVAL HISTORY: Patient is here with most likely appears to be having second lung cancer on the left upper lobe primary disease with contralateral lymphadenopathy both positive metastases adenocarcinoma by EUS.  Here to discuss the results and further planning of treatment.  Pathology has been reviewed and case was discussed in tumor conference this appears to be lung primary rather than metastases to from breast cancer. Case was also discussed with radiation oncologist today. MRI scan of brain has been ordered.  Which was negative for any malignancy. April 10, 2015 Patient is here for ongoing evaluation and continuation of 6 and the last cycle of chemotherapy Patient is planned for this.  Toes up radiation treatment No chills no fever.  Numbness in the right shoulder but not in upper or lower extremity.  No soreness in the mouth.  Appetite has been stable.  No cough or shortness of breath  Review of systems: GENERAL:  Feels good.  Active.  No fevers, sweats or weight loss. PERFORMANCE STATUS (ECOG):0 HEENT:  No visual changes, runny nose, sore throat, mouth sores or tenderness. Lungs: No shortness of breath or cough.  No hemoptysis. Cardiac:  No chest pain, palpitations, orthopnea, or PND. GI:  No nausea, vomiting, diarrhea, constipation, melena or  hematochezia. GU:  No urgency, frequency, dysuria, or hematuria. Musculoskeletal:  No back pain.  No joint pain.  No muscle tenderness. Extremities:  No pain or swelling. Skin:  No rashes or skin changes. Neuro:  No   numbness or weakness, balance or coordination issues.  Recent has occasional headache Endocrine:  No diabetes, thyroid issues, hot flashes or night sweats. Psych:  No mood changes, depression or anxiety. Pain:  No focal pain. Review of systems:  All other systems reviewed and found to be negative.  As per HPI. Otherwise, a complete review of systems is negatve.  PAST MEDICAL HISTORY: Past Medical History  Diagnosis Date  . Unspecified essential hypertension   . Kidney failure   . Personal history of tobacco use, presenting hazards to health   . Cataract   . Personal history of malignant neoplasm of breast   . Breast screening, unspecified   . Special screening for malignant neoplasms, colon   . Diabetes mellitus without complication   . MDRO (multiple drug resistant organisms) resistance   . H. pylori duodenitis   . Complication of anesthesia     BP DROPPED DURING LOBECTOMY IN 2014  . Cancer 2001    left breast cancer s/p L/SN/R in 2001  . Cancer 2014    right breast invasive CA, Er pos,Pr neg, Her 2 neg.  . Lung cancer, upper lobe 2014    right upper lobe  . Breast cancer     PAST SURGICAL HISTORY: Past Surgical History  Procedure Laterality Date  . Intercostal nerve block  2005  . Cataract extraction w/ intraocular lens implant  2005  . Carpal tunnel release Right 2011  . Insertion central venous access device w/ subcutaneous port  12-07-12  . Breast lumpectomy Left 2001  . Breast surgery Right 2014    total mastectomy  . Right lung upper lobectomy  03/2013  . Lobectomy Right   . Cataract extraction    . Mastectomy Right   . Colonoscopy      FAMILY HISTORY Family History  Problem Relation Age of Onset  . Diabetes Other   . Hyperlipidemia Other   . Hypertension Other         ADVANCED DIRECTIVES: Patient does not have any advanced healthcare directive. Information has been given.   HEALTH MAINTENANCE: Social History  Substance Use Topics  . Smoking  status: Former Smoker -- 0.50 packs/day for 30 years    Types: Cigarettes    Quit date: 02/03/2013  . Smokeless tobacco: Never Used     Comment: using 38m nicorette gum- 1/2 piece, 10 times per day  . Alcohol Use: No      Allergies  Allergen Reactions  . Metformin Diarrhea and Nausea Only  . Other Rash    Sage Wipes    Current Outpatient Prescriptions  Medication Sig Dispense Refill  . acetaminophen (TYLENOL) 325 MG tablet Take 650 mg by mouth every 6 (six) hours as needed for pain.    .Marland Kitchendexamethasone (DECADRON) 4 MG tablet Take 1 tablet (4 mg total) by mouth daily. 13 tablet 0  . esomeprazole (NEXIUM) 20 MG capsule Take 1 capsule (20 mg total) by mouth every morning. 30 capsule 0  . gabapentin (NEURONTIN) 300 MG capsule Take 300 mg by mouth as needed.    .Marland KitchenglipiZIDE (GLUCOTROL XL) 5 MG 24 hr tablet Take by mouth.    . hydrochlorothiazide (HYDRODIURIL) 25 MG tablet Take 12.5 mg by mouth daily.     . Multiple  Vitamins-Minerals (MULTIVITAMIN PO) Take by mouth.    . naproxen sodium (ANAPROX) 220 MG tablet Take 220 mg by mouth every 8 (eight) hours as needed.    . nicotine polacrilex (NICORETTE) 2 MG gum Take 2 mg by mouth 5 (five) times daily.     . ondansetron (ZOFRAN) 4 MG tablet Take 1 tablet (4 mg total) by mouth every 6 (six) hours as needed for nausea or vomiting. 30 tablet 3  . PARoxetine Mesylate (BRISDELLE PO) Take 1 tablet by mouth at bedtime.    . potassium chloride SA (K-DUR,KLOR-CON) 20 MEQ tablet Take 1 tablet (20 mEq total) by mouth daily. 30 tablet 0  . simvastatin (ZOCOR) 10 MG tablet Take 10 mg by mouth daily.     No current facility-administered medications for this visit.   Facility-Administered Medications Ordered in Other Visits  Medication Dose Route Frequency Provider Last Rate Last Dose  . sodium chloride 0.9 % injection 10 mL  10 mL Intracatheter PRN Forest Gleason, MD   10 mL at 02/04/15 1027    OBJECTIVE:  Filed Vitals:   04/10/15 0919  BP: 113/69    Pulse: 84  Temp: 97.5 F (36.4 C)     Body mass index is 26.61 kg/(m^2).    ECOG FS:0 - Asymptomatic  PHYSICAL EXAM: GENERAL:  Well developed, well nourished, sitting comfortably in the exam room in no acute distress. MENTAL STATUS:  Alert and oriented to person, place and time. HEAD:   Normocephalic, atraumatic, face symmetric, no Cushingoid features. EYES:    Pupils equal round and reactive to light and accomodation.  No conjunctivitis or scleral icterus. ENT:  Oropharynx clear without lesion.  Tongue normal. Mucous membranes moist.  RESPIRATORY:  Clear to auscultation without rales, wheezes or rhonchi. CARDIOVASCULAR:  Regular rate and rhythm without murmur, rub or gallop. BREAST:  Right breast : Status post mastectomy.  No evidence of recurrent disease skin changes or nipple discharge.  Left breast without masses, skin changes or nipple discharge. ABDOMEN:  Soft, non-tender, with active bowel sounds, and no hepatosplenomegaly.  No masses. BACK:  No CVA tenderness.  No tenderness on percussion of the back or rib cage. SKIN:  No rashes, ulcers or lesions. EXTREMITIES: No edema, no skin discoloration or tenderness.  No palpable cords. LYMPH NODES: No palpable cervical, supraclavicular, axillary or inguinal adenopathy  NEUROLOGICAL: Unremarkable. PSYCH:  Appropriate.   LAB RESULTS:  Infusion on 04/10/2015  Component Date Value Ref Range Status  . WBC 04/10/2015 5.0  3.6 - 11.0 K/uL Final  . RBC 04/10/2015 3.08* 3.80 - 5.20 MIL/uL Final  . Hemoglobin 04/10/2015 9.8* 12.0 - 16.0 g/dL Final  . HCT 04/10/2015 28.5* 35.0 - 47.0 % Final  . MCV 04/10/2015 92.7  80.0 - 100.0 fL Final  . MCH 04/10/2015 31.8  26.0 - 34.0 pg Final  . MCHC 04/10/2015 34.3  32.0 - 36.0 g/dL Final  . RDW 04/10/2015 16.9* 11.5 - 14.5 % Final  . Platelets 04/10/2015 158  150 - 440 K/uL Final  . Neutrophils Relative % 04/10/2015 81   Final  . Neutro Abs 04/10/2015 4.1  1.4 - 6.5 K/uL Final  . Lymphocytes  Relative 04/10/2015 7   Final  . Lymphs Abs 04/10/2015 0.3* 1.0 - 3.6 K/uL Final  . Monocytes Relative 04/10/2015 10   Final  . Monocytes Absolute 04/10/2015 0.5  0.2 - 0.9 K/uL Final  . Eosinophils Relative 04/10/2015 1   Final  . Eosinophils Absolute 04/10/2015 0.0  0 -  0.7 K/uL Final  . Basophils Relative 04/10/2015 1   Final  . Basophils Absolute 04/10/2015 0.0  0 - 0.1 K/uL Final  . Sodium 04/10/2015 134* 135 - 145 mmol/L Final  . Potassium 04/10/2015 3.2* 3.5 - 5.1 mmol/L Final  . Chloride 04/10/2015 102  101 - 111 mmol/L Final  . CO2 04/10/2015 26  22 - 32 mmol/L Final  . Glucose, Bld 04/10/2015 231* 65 - 99 mg/dL Final  . BUN 04/10/2015 10  6 - 20 mg/dL Final  . Creatinine, Ser 04/10/2015 0.72  0.44 - 1.00 mg/dL Final  . Calcium 04/10/2015 8.6* 8.9 - 10.3 mg/dL Final  . Total Protein 04/10/2015 7.0  6.5 - 8.1 g/dL Final  . Albumin 04/10/2015 3.1* 3.5 - 5.0 g/dL Final  . AST 04/10/2015 28  15 - 41 U/L Final  . ALT 04/10/2015 33  14 - 54 U/L Final  . Alkaline Phosphatase 04/10/2015 118  38 - 126 U/L Final  . Total Bilirubin 04/10/2015 0.3  0.3 - 1.2 mg/dL Final  . GFR calc non Af Amer 04/10/2015 >60  >60 mL/min Final  . GFR calc Af Amer 04/10/2015 >60  >60 mL/min Final   Comment: (NOTE) The eGFR has been calculated using the CKD EPI equation. This calculation has not been validated in all clinical situations. eGFR's persistently <60 mL/min signify possible Chronic Kidney Disease.   . Anion gap 04/10/2015 6  5 - 15 Final    Lab Results  Component Value Date   LABCA2 20.3 06/11/2014     STUDIES: Mm Digital Screening Unilat L  03/18/2015   CLINICAL DATA:  Screening.  EXAM: DIGITAL SCREENING UNILATERAL LEFT MAMMOGRAM WITH CAD  COMPARISON:  Previous exam(s).  ACR Breast Density Category c: The breast tissue is heterogeneously dense, which may obscure small masses.  FINDINGS: There are no findings suspicious for malignancy. Images were processed with CAD.  IMPRESSION: No  mammographic evidence of malignancy. A result letter of this screening mammogram will be mailed directly to the patient.  RECOMMENDATION: Screening mammogram in one year. (Code:SM-B-01Y)  BI-RADS CATEGORY  1: Negative.   Electronically Signed   By: Lovey Newcomer M.D.   On: 03/18/2015 12:39    ASSESSMENT: 1 left upper lobe lung mass with mediastinal lymph node along with contralateral hilar lymphadenopathy both positive for metastatic adenocarcinoma T1 N3 M0 tumor stage IIIB 2.  Previous history of right upper lobe lung cancer status post resection T1 N0 M0 tumor 3.  History of carcinoma breast.  Right breast cancer status post chemoradiation therapy on anti-hormonal therapy. Previous history of left breast cancer Proceed with a 6 and the last chemotherapy cycle Patient has split course of radiation therapy planned Reevaluate patient in 4 weeks with a repeat scan  MEDICAL DECISION MAKING:   All lab data has been reviewed and will continue chemotherapy cycle 4 and 5.  And patient will be reevaluated at cycle 6.  Patient is undergoing split course of radiation therapy   Malignant neoplasm of female breast   Staging form: Breast, AJCC 7th Edition     Clinical: Stage IIIA (T3, N1, M0) - Signed by Forest Gleason, MD on 01/26/2015 Malignant neoplasm of upper lobe, bronchus or lung   Staging form: Lung, AJCC 7th Edition     Clinical: Stage IA (T1b, N0, M0) - Signed by Forest Gleason, MD on 01/26/2015   Forest Gleason, MD   04/10/2015 9:39 AM

## 2015-04-11 ENCOUNTER — Ambulatory Visit: Payer: 59

## 2015-04-12 ENCOUNTER — Ambulatory Visit: Payer: 59

## 2015-04-15 ENCOUNTER — Ambulatory Visit: Payer: 59

## 2015-04-16 ENCOUNTER — Other Ambulatory Visit: Payer: Self-pay

## 2015-04-16 ENCOUNTER — Ambulatory Visit: Payer: 59 | Admitting: Radiation Oncology

## 2015-04-16 ENCOUNTER — Ambulatory Visit: Payer: 59

## 2015-04-16 NOTE — Patient Outreach (Signed)
[name]) Care Management  04/16/2015  Brittney Lam 08-07-1961 [id number]   Spoke to patient by phone on 04/08/15 regarding her blood sugar control.  Patient states she's taking her blood sugars a day and they are 60-'300mg'$ /dl.  She continues to take her Glipizide as ordered and has not had any low blood sugars with symptoms.  If she has a '60mg'$ /dl blood sugar, it's usually immediately when she wakes up and increases once she's eaten.   She had to be put on steroids for her throat (she was having difficulty swallowing).  The MD checked her glucose in the office (labs are checked weekly) and her blood sugar was '250mg'$ /dl.    I will follow up with her in a few weeks.   [name], RN, BA, [name], [name] Direct Dial:  [phone]  Fax:  [phone] E-mail: [name] [address] [place], [place]  [id number]

## 2015-04-17 ENCOUNTER — Ambulatory Visit: Payer: 59

## 2015-04-17 ENCOUNTER — Ambulatory Visit
Admission: RE | Admit: 2015-04-17 | Discharge: 2015-04-17 | Disposition: A | Discharge status: Home / Self Care | Payer: 59 | Source: Ambulatory Visit | Attending: Radiation Oncology | Admitting: Radiation Oncology

## 2015-04-17 ENCOUNTER — Encounter: Payer: Self-pay | Admitting: Radiation Oncology

## 2015-04-17 ENCOUNTER — Other Ambulatory Visit: Payer: Self-pay | Admitting: *Deleted

## 2015-04-17 VITALS — BP 130/68 | HR 105 | Temp 96.7°F | Wt 146.3 lb

## 2015-04-17 DIAGNOSIS — Z85118 Personal history of other malignant neoplasm of bronchus and lung: Secondary | ICD-10-CM

## 2015-04-17 MED ORDER — VENLAFAXINE HCL 37.5 MG PO TABS: 37.5000 mg | ORAL_TABLET | Freq: Every day | ORAL | Status: AC

## 2015-04-17 NOTE — Progress Notes (Signed)
Radiation Oncology Follow up Note  Name: Brittney Lam   Date:   04/17/2015 MRN:  [id number] DOB: 14-Jan-1961    This 54 y.o. female presents to the clinic today for follow-up for lung cancer stage IIIa (T1 N1 M0.)  REFERRING PROVIDER: [name], MD  HPI: Patient is a 54 year old female known history of invasive mammary carcinoma status post right modified radical mastectomy for an 8 cm lesion with 4 sentinel lymph nodes positive. She completed chest wall as well as new adjuvant chemotherapy back in 2015. We have been treating her for a left upper lobe mass with mediastinal adenopathy a T1 N1 stage IIIa poorly differentiated adenocarcinoma compatible with primary lung cancer. She's finished 4000 cGy with concurrent chemotherapy I put her on a 1 week break to reevaluate her for response. She is doing fairly well still emotionally labile continues to tear quite easily. She specifically denies dysphagia. She's having no cough or hemoptysis at this time..  COMPLICATIONS OF TREATMENT: none  FOLLOW UP COMPLIANCE: keeps appointments   PHYSICAL EXAM:  BP 130/68 mmHg  Pulse 105  Temp(Src) 96.7 F (35.9 C)  Wt 146 lb 4.4 oz (66.35 kg) Well-developed well-nourished patient in NAD. HEENT reveals PERLA, EOMI, discs not visualized.  Oral cavity is clear. No oral mucosal lesions are identified. Neck is clear without evidence of cervical or supraclavicular adenopathy. Lungs are clear to A&P. Cardiac examination is essentially unremarkable with regular rate and rhythm without murmur rub or thrill. Abdomen is benign with no organomegaly or masses noted. Motor sensory and DTR levels are equal and symmetric in the upper and lower extremities. Cranial nerves II through XII are grossly intact. Proprioception is intact. No peripheral adenopathy or edema is identified. No motor or sensory levels are noted. Crude visual fields are within normal range.   RADIOLOGY RESULTS: Repeat CT scan is planned for this  week  PLAN: At this time I to go ahead and re-CT her for possible small field lung boost. She is clinically stable. I'm also putting her on Effexor for her emotional affect. Patient will continue to thousand centigrays in 10 fractions with concurrent chemotherapy. Patient may need a I am RT treatment based on the V 20 of her lung at this point and close proximity to her cardiac structures. Our treatment plan was explained in detail to the patient she complains her treatment plan well. I have set up and ordered CT simulation tomorrow.  I would like to take this opportunity for allowing me to participate in the care of your patient.Armstead Peaks., MD

## 2015-04-18 ENCOUNTER — Ambulatory Visit
Admission: RE | Admit: 2015-04-18 | Discharge: 2015-04-18 | Disposition: A | Discharge status: Home / Self Care | Payer: 59 | Source: Ambulatory Visit | Attending: Radiation Oncology | Admitting: Radiation Oncology

## 2015-04-18 ENCOUNTER — Ambulatory Visit: Payer: 59

## 2015-04-18 DIAGNOSIS — Z510 Encounter for antineoplastic radiation therapy: Secondary | ICD-10-CM | POA: Diagnosis not present

## 2015-04-19 ENCOUNTER — Ambulatory Visit: Payer: 59

## 2015-04-22 ENCOUNTER — Ambulatory Visit: Payer: 59

## 2015-04-25 DIAGNOSIS — Z510 Encounter for antineoplastic radiation therapy: Secondary | ICD-10-CM | POA: Diagnosis not present

## 2015-04-30 ENCOUNTER — Ambulatory Visit
Admission: RE | Admit: 2015-04-30 | Discharge: 2015-04-30 | Disposition: A | Discharge status: Home / Self Care | Payer: 59 | Source: Ambulatory Visit | Attending: Radiation Oncology | Admitting: Radiation Oncology

## 2015-04-30 DIAGNOSIS — Z510 Encounter for antineoplastic radiation therapy: Secondary | ICD-10-CM | POA: Diagnosis not present

## 2015-05-01 ENCOUNTER — Ambulatory Visit
Admission: RE | Admit: 2015-05-01 | Discharge: 2015-05-01 | Disposition: A | Discharge status: Home / Self Care | Payer: 59 | Source: Ambulatory Visit | Attending: Radiation Oncology | Admitting: Radiation Oncology

## 2015-05-01 DIAGNOSIS — Z510 Encounter for antineoplastic radiation therapy: Secondary | ICD-10-CM | POA: Diagnosis not present

## 2015-05-02 ENCOUNTER — Ambulatory Visit
Admission: RE | Admit: 2015-05-02 | Discharge: 2015-05-02 | Disposition: A | Discharge status: Home / Self Care | Payer: 59 | Source: Ambulatory Visit | Attending: Radiation Oncology | Admitting: Radiation Oncology

## 2015-05-02 DIAGNOSIS — Z510 Encounter for antineoplastic radiation therapy: Secondary | ICD-10-CM | POA: Diagnosis not present

## 2015-05-03 ENCOUNTER — Ambulatory Visit
Admission: RE | Admit: 2015-05-03 | Discharge: 2015-05-03 | Disposition: A | Discharge status: Home / Self Care | Payer: 59 | Source: Ambulatory Visit | Attending: Radiation Oncology | Admitting: Radiation Oncology

## 2015-05-03 DIAGNOSIS — Z510 Encounter for antineoplastic radiation therapy: Secondary | ICD-10-CM | POA: Diagnosis not present

## 2015-05-07 ENCOUNTER — Ambulatory Visit
Admission: RE | Admit: 2015-05-07 | Discharge: 2015-05-07 | Disposition: A | Discharge status: Home / Self Care | Payer: 59 | Source: Ambulatory Visit | Attending: Radiation Oncology | Admitting: Radiation Oncology

## 2015-05-07 DIAGNOSIS — Z510 Encounter for antineoplastic radiation therapy: Secondary | ICD-10-CM | POA: Diagnosis not present

## 2015-05-08 ENCOUNTER — Inpatient Hospital Stay: Payer: 59 | Attending: Oncology

## 2015-05-08 ENCOUNTER — Other Ambulatory Visit: Payer: Self-pay

## 2015-05-08 ENCOUNTER — Encounter: Payer: Self-pay | Admitting: Oncology

## 2015-05-08 ENCOUNTER — Ambulatory Visit
Admission: RE | Admit: 2015-05-08 | Discharge: 2015-05-08 | Disposition: A | Discharge status: Home / Self Care | Payer: 59 | Source: Ambulatory Visit | Attending: Radiation Oncology | Admitting: Radiation Oncology

## 2015-05-08 ENCOUNTER — Inpatient Hospital Stay (HOSPITAL_BASED_OUTPATIENT_CLINIC_OR_DEPARTMENT_OTHER): Payer: 59 | Admitting: Oncology

## 2015-05-08 VITALS — BP 109/71 | HR 106 | Temp 97.4°F | Wt 145.2 lb

## 2015-05-08 DIAGNOSIS — Z87891 Personal history of nicotine dependence: Secondary | ICD-10-CM | POA: Insufficient documentation

## 2015-05-08 DIAGNOSIS — Z9221 Personal history of antineoplastic chemotherapy: Secondary | ICD-10-CM

## 2015-05-08 DIAGNOSIS — C3412 Malignant neoplasm of upper lobe, left bronchus or lung: Secondary | ICD-10-CM | POA: Diagnosis present

## 2015-05-08 DIAGNOSIS — I10 Essential (primary) hypertension: Secondary | ICD-10-CM | POA: Diagnosis not present

## 2015-05-08 DIAGNOSIS — Z79811 Long term (current) use of aromatase inhibitors: Secondary | ICD-10-CM

## 2015-05-08 DIAGNOSIS — R590 Localized enlarged lymph nodes: Secondary | ICD-10-CM | POA: Insufficient documentation

## 2015-05-08 DIAGNOSIS — Z923 Personal history of irradiation: Secondary | ICD-10-CM

## 2015-05-08 DIAGNOSIS — C779 Secondary and unspecified malignant neoplasm of lymph node, unspecified: Secondary | ICD-10-CM | POA: Insufficient documentation

## 2015-05-08 DIAGNOSIS — E1165 Type 2 diabetes mellitus with hyperglycemia: Secondary | ICD-10-CM

## 2015-05-08 DIAGNOSIS — C3490 Malignant neoplasm of unspecified part of unspecified bronchus or lung: Secondary | ICD-10-CM

## 2015-05-08 DIAGNOSIS — Z510 Encounter for antineoplastic radiation therapy: Secondary | ICD-10-CM | POA: Diagnosis not present

## 2015-05-08 DIAGNOSIS — E119 Type 2 diabetes mellitus without complications: Secondary | ICD-10-CM | POA: Insufficient documentation

## 2015-05-08 DIAGNOSIS — IMO0002 Reserved for concepts with insufficient information to code with codable children: Secondary | ICD-10-CM

## 2015-05-08 DIAGNOSIS — C3400 Malignant neoplasm of unspecified main bronchus: Secondary | ICD-10-CM

## 2015-05-08 DIAGNOSIS — C50911 Malignant neoplasm of unspecified site of right female breast: Secondary | ICD-10-CM | POA: Insufficient documentation

## 2015-05-08 DIAGNOSIS — Z170 Estrogen receptor positive status [ER+]: Secondary | ICD-10-CM

## 2015-05-08 DIAGNOSIS — Z9011 Acquired absence of right breast and nipple: Secondary | ICD-10-CM | POA: Diagnosis not present

## 2015-05-08 LAB — COMPREHENSIVE METABOLIC PANEL
ALT: 12 U/L — AB (ref 14–54)
ANION GAP: 7 (ref 5–15)
AST: 22 U/L (ref 15–41)
Albumin: 3.8 g/dL (ref 3.5–5.0)
Alkaline Phosphatase: 104 U/L (ref 38–126)
BUN: 9 mg/dL (ref 6–20)
CHLORIDE: 102 mmol/L (ref 101–111)
CO2: 28 mmol/L (ref 22–32)
CREATININE: 0.92 mg/dL (ref 0.44–1.00)
Calcium: 8.9 mg/dL (ref 8.9–10.3)
Glucose, Bld: 220 mg/dL — ABNORMAL HIGH (ref 65–99)
Potassium: 3.4 mmol/L — ABNORMAL LOW (ref 3.5–5.1)
Sodium: 137 mmol/L (ref 135–145)
Total Bilirubin: 0.4 mg/dL (ref 0.3–1.2)
Total Protein: 7.4 g/dL (ref 6.5–8.1)

## 2015-05-08 LAB — CBC WITH DIFFERENTIAL/PLATELET
Basophils Absolute: 0 10*3/uL (ref 0–0.1)
Basophils Relative: 1 %
EOS PCT: 1 %
Eosinophils Absolute: 0 10*3/uL (ref 0–0.7)
HCT: 32.4 % — ABNORMAL LOW (ref 35.0–47.0)
Hemoglobin: 11 g/dL — ABNORMAL LOW (ref 12.0–16.0)
LYMPHS ABS: 0.7 10*3/uL — AB (ref 1.0–3.6)
Lymphocytes Relative: 12 %
MCH: 32.9 pg (ref 26.0–34.0)
MCHC: 34 g/dL (ref 32.0–36.0)
MCV: 96.8 fL (ref 80.0–100.0)
MONO ABS: 0.9 10*3/uL (ref 0.2–0.9)
MONOS PCT: 15 %
Neutro Abs: 4.1 10*3/uL (ref 1.4–6.5)
Neutrophils Relative %: 71 %
PLATELETS: 287 10*3/uL (ref 150–440)
RBC: 3.35 MIL/uL — AB (ref 3.80–5.20)
RDW: 20.7 % — ABNORMAL HIGH (ref 11.5–14.5)
WBC: 5.8 10*3/uL (ref 3.6–11.0)

## 2015-05-08 MED ORDER — POTASSIUM CHLORIDE CRYS ER 20 MEQ PO TBCR: 20.0000 meq | EXTENDED_RELEASE_TABLET | Freq: Every day | ORAL | Status: AC

## 2015-05-08 NOTE — Progress Notes (Signed)
[place] @ [facility] [phone]  Fax:([phone] [place]: 1961-02-15  MR#: [id number]  OAC#:[id number]  Patient Care Team: [name], MD as PCP - General (Family Medicine) [name], MD (General Surgery) [name], RN as [place] Management [name], MD as Referring Physician (Family Medicine)  CHIEF COMPLAINT:  Chief Complaint  Patient presents with  . Follow-up   Oncology History   71. 54 year old female status post recent left breast cancer in 2005  2. Right breast cancer, locally advanced stage IIIa (pyT3, N1, M0), invasive mammary carcinoma with mucin production. Status post right modified radical mastectomy for 8 cm lesion with one of 4 sentinel lymph nodes positive for metastatic disease. Tumor is ER positive PR negative HER-2/neu not overexpressed. 3. Completed neoadjuvant chemotherapy with chest wall radiation therapy,  Finished on October 21, 2013. 4. Started letrazole 2.5 mg by mouth daily from October 24, 2013. 5. Carcinoma of lung.  Right upper lobe, status post right upper lobectomy, [id number]. 6.  Left upper lobe mass with mediastinal lymph node biopsies positive for poorly differentiated adenocarcinoma T1 N1 M0 tumor III Starting chemoradiation therapy in July of 2016 (second primary) Patient had EUS in June of 2016.  Bilateral hilar adenopathy which has been biopsied was positive for metastatic adenocarcinoma consistent with lung primary Starting chemoradiation therapy in July of 2016 7.  Has finished 6 cycles of chemotherapy with carboplatinum and Taxol concurrent with radiation therapy on April 10, 2015   8.  Patient had a split course of radiation and finishing up second round of treatment on May 09, 2015    INTERVAL HISTORY: Patient is here with most likely appears to be having second lung cancer on the left upper lobe primary disease with contralateral lymphadenopathy both  positive metastases adenocarcinoma by EUS.  Here to discuss the results and further planning of treatment.  Pathology has been reviewed and case was discussed in tumor conference this appears to be lung primary rather than metastases to from breast cancer. Case was also discussed with radiation oncologist today. MRI scan of brain has been ordered.  Which was negative for any malignancy. April 10, 2015 Patient is here for ongoing evaluation and continuation of 6 and the last cycle of chemotherapy Patient is planned for this.  Toes up radiation treatment No chills no fever.  Numbness in the right shoulder but not in upper or lower extremity.  No soreness in the mouth.  Appetite has been stable.  No cough or shortness of breath May 08, 2015 Patient is here for ongoing evaluation and treatment consideration.  Patient is finishing split course of radiation therapy Tolerated treatment very well.  No cough or shortness of breath Proceed with repeat PET scan for restaging   Review of systems: GENERAL:  Feels good.  Active.  No fevers, sweats or weight loss. PERFORMANCE STATUS (ECOG):0 HEENT:  No visual changes, runny nose, sore throat, mouth sores or tenderness. Lungs: No shortness of breath or cough.  No hemoptysis. Cardiac:  No chest pain, palpitations, orthopnea, or PND. GI:  No nausea, vomiting, diarrhea, constipation, melena or hematochezia. GU:  No urgency, frequency, dysuria, or hematuria. Musculoskeletal:  No back pain.  No joint pain.  No muscle tenderness. Extremities:  No pain or swelling. Skin:  No rashes or skin changes. Neuro:  No  numbness or weakness, balance or coordination issues.  Recent has occasional headache Endocrine:  No diabetes, thyroid issues, hot flashes or  night sweats. Psych:  No mood changes, depression or anxiety. Pain:  No focal pain. Review of systems:  All other systems reviewed and found to be negative.  As per HPI. Otherwise, a complete review of  systems is negatve.  PAST MEDICAL HISTORY: Past Medical History  Diagnosis Date  . Unspecified essential hypertension   . Kidney failure   . Personal history of tobacco use, presenting hazards to health   . Cataract   . Personal history of malignant neoplasm of breast   . Breast screening, unspecified   . Special screening for malignant neoplasms, colon   . Diabetes mellitus without complication   . MDRO (multiple drug resistant organisms) resistance   . H. pylori duodenitis   . Complication of anesthesia     BP DROPPED DURING LOBECTOMY IN 2014  . Cancer 2001    left breast cancer s/p L/SN/R in 2001  . Cancer 2014    right breast invasive CA, Er pos,Pr neg, Her 2 neg.  . Lung cancer, upper lobe 2014    right upper lobe  . Breast cancer     PAST SURGICAL HISTORY: Past Surgical History  Procedure Laterality Date  . Intercostal nerve block  2005  . Cataract extraction w/ intraocular lens implant  2005  . Carpal tunnel release Right 2011  . Insertion central venous access device w/ subcutaneous port  12-07-12  . Breast lumpectomy Left 2001  . Breast surgery Right 2014    total mastectomy  . Right lung upper lobectomy  03/2013  . Lobectomy Right   . Cataract extraction    . Mastectomy Right   . Colonoscopy      FAMILY HISTORY Family History  Problem Relation Age of Onset  . Diabetes Other   . Hyperlipidemia Other   . Hypertension Other         ADVANCED DIRECTIVES: Patient does not have any advanced healthcare directive. Information has been given.   HEALTH MAINTENANCE: Social History  Substance Use Topics  . Smoking status: Former Smoker -- 0.50 packs/day for 30 years    Types: Cigarettes    Quit date: 02/03/2013  . Smokeless tobacco: Never Used     Comment: using 80m nicorette gum- 1/2 piece, 10 times per day  . Alcohol Use: No      Allergies  Allergen Reactions  . Metformin Diarrhea and Nausea Only  . Other Rash    Sage Wipes    Current  Outpatient Prescriptions  Medication Sig Dispense Refill  . acetaminophen (TYLENOL) 325 MG tablet Take 650 mg by mouth every 6 (six) hours as needed for pain.    .Marland Kitchendexamethasone (DECADRON) 4 MG tablet Take 1 tablet (4 mg total) by mouth daily. 13 tablet 0  . esomeprazole (NEXIUM) 20 MG capsule Take 1 capsule (20 mg total) by mouth every morning. 30 capsule 0  . gabapentin (NEURONTIN) 300 MG capsule Take 300 mg by mouth as needed.    .Marland KitchenglipiZIDE (GLUCOTROL XL) 5 MG 24 hr tablet Take by mouth.    . hydrochlorothiazide (HYDRODIURIL) 25 MG tablet Take 12.5 mg by mouth daily.     . Multiple Vitamins-Minerals (MULTIVITAMIN PO) Take by mouth.    . naproxen sodium (ANAPROX) 220 MG tablet Take 220 mg by mouth every 8 (eight) hours as needed.    . nicotine polacrilex (NICORETTE) 2 MG gum Take 2 mg by mouth 5 (five) times daily.     . ondansetron (ZOFRAN) 4 MG tablet Take 1 tablet (4  mg total) by mouth every 6 (six) hours as needed for nausea or vomiting. 30 tablet 3  . PARoxetine Mesylate (BRISDELLE PO) Take 1 tablet by mouth at bedtime.    . potassium chloride SA (K-DUR,KLOR-CON) 20 MEQ tablet Take 1 tablet (20 mEq total) by mouth daily. 30 tablet 0  . simvastatin (ZOCOR) 10 MG tablet Take 10 mg by mouth daily.    Marland Kitchen venlafaxine (EFFEXOR) 37.5 MG tablet Take 1 tablet (37.5 mg total) by mouth daily. 30 tablet 6   No current facility-administered medications for this visit.   Facility-Administered Medications Ordered in Other Visits  Medication Dose Route Frequency Provider Last Rate Last Dose  . sodium chloride 0.9 % injection 10 mL  10 mL Intracatheter PRN Forest Gleason, MD   10 mL at 02/04/15 1027    OBJECTIVE:  Filed Vitals:   05/08/15 1126  BP: 109/71  Pulse: 106  Temp: 97.4 F (36.3 C)     Body mass index is 26.56 kg/(m^2).    ECOG FS:0 - Asymptomatic  PHYSICAL EXAM: GENERAL:  Well developed, well nourished, sitting comfortably in the exam room in no acute distress. MENTAL STATUS:  Alert  and oriented to person, place and time. HEAD:   Normocephalic, atraumatic, face symmetric, no Cushingoid features. EYES:    Pupils equal round and reactive to light and accomodation.  No conjunctivitis or scleral icterus. ENT:  Oropharynx clear without lesion.  Tongue normal. Mucous membranes moist.  RESPIRATORY:  Clear to auscultation without rales, wheezes or rhonchi. CARDIOVASCULAR:  Regular rate and rhythm without murmur, rub or gallop. BREAST:  Right breast : Status post mastectomy.  No evidence of recurrent disease skin changes or nipple discharge.  Left breast without masses, skin changes or nipple discharge. ABDOMEN:  Soft, non-tender, with active bowel sounds, and no hepatosplenomegaly.  No masses. BACK:  No CVA tenderness.  No tenderness on percussion of the back or rib cage. SKIN:  No rashes, ulcers or lesions. EXTREMITIES: No edema, no skin discoloration or tenderness.  No palpable cords. LYMPH NODES: No palpable cervical, supraclavicular, axillary or inguinal adenopathy  NEUROLOGICAL: Unremarkable. PSYCH:  Appropriate.   LAB RESULTS:  Appointment on 05/08/2015  Component Date Value Ref Range Status  . WBC 05/08/2015 5.8  3.6 - 11.0 K/uL Final  . RBC 05/08/2015 3.35* 3.80 - 5.20 MIL/uL Final  . Hemoglobin 05/08/2015 11.0* 12.0 - 16.0 g/dL Final  . HCT 05/08/2015 32.4* 35.0 - 47.0 % Final  . MCV 05/08/2015 96.8  80.0 - 100.0 fL Final  . MCH 05/08/2015 32.9  26.0 - 34.0 pg Final  . MCHC 05/08/2015 34.0  32.0 - 36.0 g/dL Final  . RDW 05/08/2015 20.7* 11.5 - 14.5 % Final  . Platelets 05/08/2015 287  150 - 440 K/uL Final  . Neutrophils Relative % 05/08/2015 71   Final  . Neutro Abs 05/08/2015 4.1  1.4 - 6.5 K/uL Final  . Lymphocytes Relative 05/08/2015 12   Final  . Lymphs Abs 05/08/2015 0.7* 1.0 - 3.6 K/uL Final  . Monocytes Relative 05/08/2015 15   Final  . Monocytes Absolute 05/08/2015 0.9  0.2 - 0.9 K/uL Final  . Eosinophils Relative 05/08/2015 1   Final  . Eosinophils  Absolute 05/08/2015 0.0  0 - 0.7 K/uL Final  . Basophils Relative 05/08/2015 1   Final  . Basophils Absolute 05/08/2015 0.0  0 - 0.1 K/uL Final  . Sodium 05/08/2015 137  135 - 145 mmol/L Final  . Potassium 05/08/2015 3.4* 3.5 -  5.1 mmol/L Final  . Chloride 05/08/2015 102  101 - 111 mmol/L Final  . CO2 05/08/2015 28  22 - 32 mmol/L Final  . Glucose, Bld 05/08/2015 220* 65 - 99 mg/dL Final  . BUN 05/08/2015 9  6 - 20 mg/dL Final  . Creatinine, Ser 05/08/2015 0.92  0.44 - 1.00 mg/dL Final  . Calcium 05/08/2015 8.9  8.9 - 10.3 mg/dL Final  . Total Protein 05/08/2015 7.4  6.5 - 8.1 g/dL Final  . Albumin 05/08/2015 3.8  3.5 - 5.0 g/dL Final  . AST 05/08/2015 22  15 - 41 U/L Final  . ALT 05/08/2015 12* 14 - 54 U/L Final  . Alkaline Phosphatase 05/08/2015 104  38 - 126 U/L Final  . Total Bilirubin 05/08/2015 0.4  0.3 - 1.2 mg/dL Final  . GFR calc non Af Amer 05/08/2015 >60  >60 mL/min Final  . GFR calc Af Amer 05/08/2015 >60  >60 mL/min Final   Comment: (NOTE) The eGFR has been calculated using the CKD EPI equation. This calculation has not been validated in all clinical situations. eGFR's persistently <60 mL/min signify possible Chronic Kidney Disease.   . Anion gap 05/08/2015 7  5 - 15 Final    Lab Results  Component Value Date   LABCA2 20.3 06/11/2014     STUDIES: No results found.  ASSESSMENT: 1 left upper lobe lung mass with mediastinal lymph node along with contralateral hilar lymphadenopathy both positive for metastatic adenocarcinoma T1 N3 M0 tumor stage IIIB 2.  Previous history of right upper lobe lung cancer status post resection T1 N0 M0 tumor 3.  History of carcinoma breast.  Right breast cancer status post chemoradiation therapy on anti-hormonal therapy. Previous history of left breast cancer Proceed with a 6 and the last chemotherapy cycle Patient has split course of radiation therapy planned 2 seed with a repeat PET scanning in October 14 [place] 2016 Consider  consolidation chemotherapy  MEDICAL DECISION MAKING:   All lab data has been reviewed and will continue chemotherapy cycle 4 and 5.  And patient will be reevaluated at cycle 6.  Patient is undergoing split course of radiation therapy   Malignant neoplasm of female breast   Staging form: Breast, AJCC 7th Edition     Clinical: Stage IIIA (T3, N1, M0) - Signed by Forest Gleason, MD on 01/26/2015 Malignant neoplasm of upper lobe, bronchus or lung   Staging form: Lung, AJCC 7th Edition     Clinical: Stage IA (T1b, N0, M0) - Signed by Forest Gleason, MD on 01/26/2015   Forest Gleason, MD   05/08/2015 11:58 AM

## 2015-05-08 NOTE — Patient Outreach (Signed)
[place] [facility]) Care Management  05/08/2015  Brittney Lam October 19, 1960 [id number]  Spoke to patient by phone today. She was sitting at the [facility] waiting for her appointment with Dr. Oliva Bustard  She tells me she had completed her radiation on August 10th but 2 more tumors were found in her lungs.  She is having 10 more radiation treatments in an attempt to eradicate them- she has 5 more radiation treatments after today and then they will do a PET scan.  The [place] will cover her [employer] benefits this month but then she will no longer have insurance as she has lost her job at [employer].  I have referred her to [facility] to help with her medications once she is out of insurance.   She is not checking blood sugars because they "are so out of wack from all the medications".  She is taking her Glipizide and does report having some hypoglycemia; again "because of the medications".   [name], RN, BA, [place], [place] Direct Dial:  [phone]  Fax:  [phone] E-mail: [name] [address] [place], [place]  [address]

## 2015-05-08 NOTE — Progress Notes (Signed)
Patient does not have living will.  Former smoker. 

## 2015-05-09 ENCOUNTER — Ambulatory Visit
Admission: RE | Admit: 2015-05-09 | Discharge: 2015-05-09 | Disposition: A | Discharge status: Home / Self Care | Payer: 59 | Source: Ambulatory Visit | Attending: Radiation Oncology | Admitting: Radiation Oncology

## 2015-05-09 DIAGNOSIS — Z510 Encounter for antineoplastic radiation therapy: Secondary | ICD-10-CM | POA: Diagnosis not present

## 2015-05-10 ENCOUNTER — Encounter: Payer: Self-pay | Admitting: Internal Medicine

## 2015-05-10 ENCOUNTER — Ambulatory Visit
Admission: RE | Admit: 2015-05-10 | Discharge: 2015-05-10 | Disposition: A | Discharge status: Home / Self Care | Payer: 59 | Source: Ambulatory Visit | Attending: Radiation Oncology | Admitting: Radiation Oncology

## 2015-05-10 DIAGNOSIS — Z510 Encounter for antineoplastic radiation therapy: Secondary | ICD-10-CM | POA: Diagnosis not present

## 2015-05-13 ENCOUNTER — Ambulatory Visit
Admission: RE | Admit: 2015-05-13 | Discharge: 2015-05-13 | Disposition: A | Discharge status: Home / Self Care | Payer: 59 | Source: Ambulatory Visit | Attending: Radiation Oncology | Admitting: Radiation Oncology

## 2015-05-13 DIAGNOSIS — Z510 Encounter for antineoplastic radiation therapy: Secondary | ICD-10-CM | POA: Diagnosis not present

## 2015-05-14 ENCOUNTER — Inpatient Hospital Stay: Payer: 59

## 2015-05-14 ENCOUNTER — Ambulatory Visit
Admission: RE | Admit: 2015-05-14 | Discharge: 2015-05-14 | Disposition: A | Discharge status: Home / Self Care | Payer: 59 | Source: Ambulatory Visit | Attending: Radiation Oncology | Admitting: Radiation Oncology

## 2015-05-14 DIAGNOSIS — Z510 Encounter for antineoplastic radiation therapy: Secondary | ICD-10-CM | POA: Diagnosis not present

## 2015-05-14 DIAGNOSIS — C3412 Malignant neoplasm of upper lobe, left bronchus or lung: Secondary | ICD-10-CM | POA: Diagnosis not present

## 2015-05-14 DIAGNOSIS — C801 Malignant (primary) neoplasm, unspecified: Secondary | ICD-10-CM

## 2015-05-14 MED ORDER — HEPARIN SOD (PORK) LOCK FLUSH 100 UNIT/ML IV SOLN
INTRAVENOUS | Status: AC
  Filled 2015-05-14: qty 5

## 2015-05-14 MED ORDER — SODIUM CHLORIDE 0.9 % IJ SOLN
10.0000 mL | Freq: Once | INTRAMUSCULAR | Status: AC
  Administered 2015-05-14: 10 mL via INTRAVENOUS
  Filled 2015-05-14: qty 10

## 2015-05-14 MED ORDER — HEPARIN SOD (PORK) LOCK FLUSH 100 UNIT/ML IV SOLN
500.0000 [IU] | Freq: Once | INTRAVENOUS | Status: AC
Start: 2015-05-14 — End: 2015-05-14
  Administered 2015-05-14: 500 [IU] via INTRAVENOUS

## 2015-05-15 ENCOUNTER — Ambulatory Visit
Admission: RE | Admit: 2015-05-15 | Discharge: 2015-05-15 | Disposition: A | Discharge status: Home / Self Care | Payer: 59 | Source: Ambulatory Visit | Attending: Radiation Oncology | Admitting: Radiation Oncology

## 2015-05-15 DIAGNOSIS — Z510 Encounter for antineoplastic radiation therapy: Secondary | ICD-10-CM | POA: Diagnosis not present

## 2015-05-27 DIAGNOSIS — Z86000 Personal history of in-situ neoplasm of breast: Secondary | ICD-10-CM

## 2015-06-14 ENCOUNTER — Ambulatory Visit
Admission: RE | Admit: 2015-06-14 | Discharge: 2015-06-14 | Disposition: A | Discharge status: Home / Self Care | Payer: 59 | Source: Ambulatory Visit | Attending: Oncology | Admitting: Oncology

## 2015-06-14 DIAGNOSIS — R590 Localized enlarged lymph nodes: Secondary | ICD-10-CM | POA: Diagnosis not present

## 2015-06-14 DIAGNOSIS — C3400 Malignant neoplasm of unspecified main bronchus: Secondary | ICD-10-CM | POA: Diagnosis not present

## 2015-06-14 DIAGNOSIS — C3490 Malignant neoplasm of unspecified part of unspecified bronchus or lung: Secondary | ICD-10-CM

## 2015-06-14 LAB — GLUCOSE, CAPILLARY: GLUCOSE-CAPILLARY: 91 mg/dL (ref 65–99)

## 2015-06-14 MED ORDER — FLUDEOXYGLUCOSE F - 18 (FDG) INJECTION
11.7100 | Freq: Once | INTRAVENOUS | Status: DC | PRN
  Administered 2015-06-14: 11.71 via INTRAVENOUS
  Filled 2015-06-14: qty 11.71

## 2015-06-24 ENCOUNTER — Inpatient Hospital Stay: Payer: No Typology Code available for payment source

## 2015-06-24 ENCOUNTER — Encounter: Payer: Self-pay | Admitting: Radiation Oncology

## 2015-06-24 ENCOUNTER — Ambulatory Visit
Admission: RE | Admit: 2015-06-24 | Discharge: 2015-06-24 | Disposition: A | Discharge status: Home / Self Care | Payer: 59 | Source: Ambulatory Visit | Attending: Radiation Oncology | Admitting: Radiation Oncology

## 2015-06-24 ENCOUNTER — Encounter: Payer: Self-pay | Admitting: Oncology

## 2015-06-24 ENCOUNTER — Inpatient Hospital Stay: Payer: No Typology Code available for payment source | Attending: Oncology | Admitting: Oncology

## 2015-06-24 VITALS — BP 132/82 | HR 97 | Temp 97.7°F | Resp 19 | Wt 142.9 lb

## 2015-06-24 DIAGNOSIS — C771 Secondary and unspecified malignant neoplasm of intrathoracic lymph nodes: Secondary | ICD-10-CM | POA: Diagnosis not present

## 2015-06-24 DIAGNOSIS — Z79811 Long term (current) use of aromatase inhibitors: Secondary | ICD-10-CM | POA: Insufficient documentation

## 2015-06-24 DIAGNOSIS — I10 Essential (primary) hypertension: Secondary | ICD-10-CM | POA: Insufficient documentation

## 2015-06-24 DIAGNOSIS — Z170 Estrogen receptor positive status [ER+]: Secondary | ICD-10-CM | POA: Diagnosis not present

## 2015-06-24 DIAGNOSIS — Z79899 Other long term (current) drug therapy: Secondary | ICD-10-CM | POA: Insufficient documentation

## 2015-06-24 DIAGNOSIS — Z87891 Personal history of nicotine dependence: Secondary | ICD-10-CM | POA: Diagnosis not present

## 2015-06-24 DIAGNOSIS — Z85118 Personal history of other malignant neoplasm of bronchus and lung: Secondary | ICD-10-CM

## 2015-06-24 DIAGNOSIS — Z9221 Personal history of antineoplastic chemotherapy: Secondary | ICD-10-CM | POA: Diagnosis not present

## 2015-06-24 DIAGNOSIS — C3490 Malignant neoplasm of unspecified part of unspecified bronchus or lung: Secondary | ICD-10-CM

## 2015-06-24 DIAGNOSIS — C50911 Malignant neoplasm of unspecified site of right female breast: Secondary | ICD-10-CM | POA: Diagnosis not present

## 2015-06-24 DIAGNOSIS — C3400 Malignant neoplasm of unspecified main bronchus: Secondary | ICD-10-CM

## 2015-06-24 DIAGNOSIS — Z923 Personal history of irradiation: Secondary | ICD-10-CM | POA: Insufficient documentation

## 2015-06-24 DIAGNOSIS — E119 Type 2 diabetes mellitus without complications: Secondary | ICD-10-CM | POA: Insufficient documentation

## 2015-06-24 DIAGNOSIS — J700 Acute pulmonary manifestations due to radiation: Secondary | ICD-10-CM | POA: Diagnosis not present

## 2015-06-24 DIAGNOSIS — C3412 Malignant neoplasm of upper lobe, left bronchus or lung: Secondary | ICD-10-CM | POA: Diagnosis not present

## 2015-06-24 DIAGNOSIS — Z9011 Acquired absence of right breast and nipple: Secondary | ICD-10-CM | POA: Insufficient documentation

## 2015-06-24 LAB — COMPREHENSIVE METABOLIC PANEL
ALBUMIN: 3.2 g/dL — AB (ref 3.5–5.0)
ALT: 14 U/L (ref 14–54)
AST: 21 U/L (ref 15–41)
Alkaline Phosphatase: 105 U/L (ref 38–126)
Anion gap: 6 (ref 5–15)
BUN: 8 mg/dL (ref 6–20)
CHLORIDE: 106 mmol/L (ref 101–111)
CO2: 24 mmol/L (ref 22–32)
CREATININE: 0.7 mg/dL (ref 0.44–1.00)
Calcium: 8.5 mg/dL — ABNORMAL LOW (ref 8.9–10.3)
GFR calc Af Amer: >60 mL/min (ref >60)
GLUCOSE: 113 mg/dL — AB (ref 65–99)
Potassium: 3.9 mmol/L (ref 3.5–5.1)
Sodium: 136 mmol/L (ref 135–145)
Total Bilirubin: 0.4 mg/dL (ref 0.3–1.2)
Total Protein: 7.7 g/dL (ref 6.5–8.1)

## 2015-06-24 LAB — CBC WITH DIFFERENTIAL/PLATELET
BASOS ABS: 0.1 10*3/uL (ref 0–0.1)
BASOS PCT: 1 %
EOS PCT: 4 %
Eosinophils Absolute: 0.4 10*3/uL (ref 0–0.7)
HCT: 31.5 % — ABNORMAL LOW (ref 35.0–47.0)
Hemoglobin: 10.7 g/dL — ABNORMAL LOW (ref 12.0–16.0)
LYMPHS PCT: 5 %
Lymphs Abs: 0.5 10*3/uL — ABNORMAL LOW (ref 1.0–3.6)
MCH: 32.2 pg (ref 26.0–34.0)
MCHC: 34 g/dL (ref 32.0–36.0)
MCV: 94.6 fL (ref 80.0–100.0)
Monocytes Absolute: 1.2 10*3/uL — ABNORMAL HIGH (ref 0.2–0.9)
Monocytes Relative: 12 %
NEUTROS ABS: 8.3 10*3/uL — AB (ref 1.4–6.5)
Neutrophils Relative %: 78 %
PLATELETS: 400 10*3/uL (ref 150–440)
RBC: 3.33 MIL/uL — AB (ref 3.80–5.20)
RDW: 15.2 % — ABNORMAL HIGH (ref 11.5–14.5)
WBC: 10.5 10*3/uL (ref 3.6–11.0)

## 2015-06-24 MED ORDER — PREDNISONE 5 MG PO TABS: ORAL_TABLET | ORAL | Status: DC

## 2015-06-24 MED ORDER — HEPARIN SOD (PORK) LOCK FLUSH 100 UNIT/ML IV SOLN
INTRAVENOUS | Status: AC
  Filled 2015-06-24: qty 5

## 2015-06-24 MED ORDER — SODIUM CHLORIDE 0.9 % IJ SOLN
10.0000 mL | INTRAMUSCULAR | Status: DC | PRN
  Administered 2015-06-24: 10 mL via INTRAVENOUS
  Filled 2015-06-24: qty 10

## 2015-06-24 MED ORDER — HEPARIN SOD (PORK) LOCK FLUSH 100 UNIT/ML IV SOLN
500.0000 [IU] | Freq: Once | INTRAVENOUS | Status: AC
  Administered 2015-06-24: 500 [IU] via INTRAVENOUS

## 2015-06-24 NOTE — Progress Notes (Signed)
Radiation Oncology Follow up Note  Name: Brittney Lam   Date:   06/24/2015 MRN:  [id number] DOB: 1960/12/14    This 54 y.o. female presents to the clinic today for follow-up for lung cancer.  REFERRING PROVIDER: [name], MD  HPI: Patient is a 54 year old female now out 1 month who underwent combined modality treatment with chemotherapy and radiation for a left upper lobe stage IIIB poorly differentiated adenocarcinoma. Patient also has a history of right breast cancer locally advanced stage IIIa who completed neoadjuvant chemotherapy and chest wall radiation for that. She is also status post a right upper lobectomy for a stage I non-small cell lung cancer. She is seen today in routine follow-up is doing fairly well still has some marked shortness of breath and dyspnea on exertion. She does have a significant mostly nonproductive cough. No dysphagia. Her recent PET CT scan shows interval decrease size of the left upper lobe mass and decreased metabolic activity also decreased size and metabolic activity the mediastinal and hilar nodes. She does have a fairly extensive interstitial airspace disease  COMPLICATIONS OF TREATMENT: none  FOLLOW UP COMPLIANCE: keeps appointments   PHYSICAL EXAM:  There were no vitals taken for this visit. Well-developed female in NAD. She has some slight decreased breath sounds bilaterally in the chest. No evidence of cervical or supraclavicular adenopathy. Well-developed well-nourished patient in NAD. HEENT reveals PERLA, EOMI, discs not visualized.  Oral cavity is clear. No oral mucosal lesions are identified. Neck is clear without evidence of cervical or supraclavicular adenopathy. Lungs are clear to A&P. Cardiac examination is essentially unremarkable with regular rate and rhythm without murmur rub or thrill. Abdomen is benign with no organomegaly or masses noted. Motor sensory and DTR levels are equal and symmetric in the upper and lower extremities.  Cranial nerves II through XII are grossly intact. Proprioception is intact. No peripheral adenopathy or edema is identified. No motor or sensory levels are noted. Crude visual fields are within normal range.  RADIOLOGY RESULTS: PET CT scan is reviewed and compatible with the above-stated findings  PLAN: At the present time she slowly recovering from her combined modality treatment. I have reviewed her PET CT scan and please with the overall result. I've asked to see her back in 4-5 months for follow-up. She continues close follow-up care with medical oncology. We'll be happy to reevaluate the patient at any time should further radiation treatment be indicated.  I would like to take this opportunity for allowing me to participate in the care of your patient.Armstead Peaks., MD

## 2015-06-24 NOTE — Progress Notes (Signed)
[place] @ [facility] [phone]  Fax:([phone]) [place]: [birth year]  MR#: [id number]  BZJ#:[id number]  Patient Care Team: [name], MD as PCP - General (Family Medicine) [name], MD (General Surgery) [name], RN as [place] Management [name], MD as Referring Physician (Family Medicine)  CHIEF COMPLAINT:  Chief Complaint  Patient presents with  . Shortness of Breath    cough   Oncology History   15. 54 year old female status post recent left breast cancer in 2005  2. Right breast cancer, locally advanced stage IIIa (pyT3, N1, M0), invasive mammary carcinoma with mucin production. Status post right modified radical mastectomy for 8 cm lesion with one of 4 sentinel lymph nodes positive for metastatic disease. Tumor is ER positive PR negative HER-2/neu not overexpressed. 3. Completed neoadjuvant chemotherapy with chest wall radiation therapy,  Finished on October 21, 2013. 4. Started letrazole 2.5 mg by mouth daily from October 24, 2013. 5. Carcinoma of lung.  Right upper lobe, status post right upper lobectomy, [id number]. 6.  Left upper lobe mass with mediastinal lymph node biopsies positive for poorly differentiated adenocarcinoma T1 N1 M0 tumor III Starting chemoradiation therapy in July of 2016 (second primary) Patient had EUS in June of 2016.  Bilateral hilar adenopathy which has been biopsied was positive for metastatic adenocarcinoma consistent with lung primary Starting chemoradiation therapy in July of 2016 7.  Has finished 6 cycles of chemotherapy with carboplatinum and Taxol concurrent with radiation therapy on April 10, 2015       INTERVAL HISTORY: Patient is here with most likely appears to be having second lung cancer on the left upper lobe primary disease with contralateral lymphadenopathy both positive metastases adenocarcinoma by EUS.  Here to discuss the results and further  planning of treatment.  Pathology has been reviewed and case was discussed in tumor conference this appears to be lung primary rather than metastases to from breast cancer. Case was also discussed with radiation oncologist today. MRI scan of brain has been ordered.  Which was negative for any malignancy. 54 year old lady complaining of shortness of breath. Continuing cough dry hacking cough without expectoration Had finished radiation therapy to the chest had locally advanced stage IIIa carcinoma  Review of systems: GENERAL:  Feels good.  Active.  No fevers, sweats or weight loss. PERFORMANCE STATUS (ECOG):0 HEENT:  No visual changes, runny nose, sore throat, mouth sores or tenderness. Lungs: No shortness of breath or cough.  No hemoptysis. Cardiac:  No chest pain, palpitations, orthopnea, or PND. GI:  No nausea, vomiting, diarrhea, constipation, melena or hematochezia. GU:  No urgency, frequency, dysuria, or hematuria. Musculoskeletal:  No back pain.  No joint pain.  No muscle tenderness. Extremities:  No pain or swelling. Skin:  No rashes or skin changes. Neuro:  No  numbness or weakness, balance or coordination issues.  Recent has occasional headache Endocrine:  No diabetes, thyroid issues, hot flashes or night sweats. Psych:  No mood changes, depression or anxiety. Pain:  No focal pain. Review of systems:  All other systems reviewed and found to be negative.  As per HPI. Otherwise, a complete review of systems is negatve.  PAST MEDICAL HISTORY: Past Medical History  Diagnosis Date  . Unspecified essential hypertension   . Kidney failure   . Personal history of tobacco use, presenting hazards to health   . Cataract   . Personal history of malignant neoplasm of breast   . Breast  screening, unspecified   . Special screening for malignant neoplasms, colon   . Diabetes mellitus without complication ([place])   . MDRO (multiple drug resistant organisms) resistance   . H. pylori  duodenitis   . Complication of anesthesia     BP DROPPED DURING LOBECTOMY IN 2014  . Cancer [facility]) 2001    left breast cancer s/p L/SN/R in 2001  . Cancer [facility]) 2014    right breast invasive CA, Er pos,Pr neg, Her 2 neg.  . Lung cancer, upper lobe ([place]) 2014    right upper lobe  . Breast cancer ([place])     PAST SURGICAL HISTORY: Past Surgical History  Procedure Laterality Date  . Intercostal nerve block  2005  . Cataract extraction w/ intraocular lens implant  2005  . Carpal tunnel release Right 2011  . Insertion central venous access device w/ subcutaneous port  12-07-12  . Breast lumpectomy Left 2001  . Breast surgery Right 2014    total mastectomy  . Right lung upper lobectomy  03/2013  . Lobectomy Right   . Cataract extraction    . Mastectomy Right   . Colonoscopy    . Endobronchial ultrasound N/A 02/05/2015    Procedure: ENDOBRONCHIAL ULTRASOUND;  Surgeon: Flora Lipps, MD;  Location: [facility] ORS;  Service: Cardiopulmonary;  Laterality: N/A;    FAMILY HISTORY Family History  Problem Relation Age of Onset  . Diabetes Other   . Hyperlipidemia Other   . Hypertension Other         ADVANCED DIRECTIVES: Patient does not have any advanced healthcare directive. Information has been given.   HEALTH MAINTENANCE: Social History  Substance Use Topics  . Smoking status: Former Smoker -- 0.50 packs/day for 30 years    Types: Cigarettes    Quit date: 02/03/2013  . Smokeless tobacco: Never Used     Comment: using 33m nicorette gum- 1/2 piece, 10 times per day  . Alcohol Use: No      Allergies  Allergen Reactions  . Metformin Diarrhea and Nausea Only  . Other Rash    Sage Wipes    Current Outpatient Prescriptions  Medication Sig Dispense Refill  . acetaminophen (TYLENOL) 325 MG tablet Take 650 mg by mouth every 6 (six) hours as needed for pain.    .Marland Kitchendexamethasone (DECADRON) 4 MG tablet Take 1 tablet (4 mg total) by mouth daily. 13 tablet 0  . esomeprazole (NEXIUM) 20 MG  capsule Take 1 capsule (20 mg total) by mouth every morning. 30 capsule 0  . gabapentin (NEURONTIN) 300 MG capsule Take 300 mg by mouth as needed.    .Marland KitchenglipiZIDE (GLUCOTROL XL) 5 MG 24 hr tablet Take by mouth.    . hydrochlorothiazide (HYDRODIURIL) 25 MG tablet Take 12.5 mg by mouth daily.     . Multiple Vitamins-Minerals (MULTIVITAMIN PO) Take by mouth.    . naproxen sodium (ANAPROX) 220 MG tablet Take 220 mg by mouth every 8 (eight) hours as needed.    . ondansetron (ZOFRAN) 4 MG tablet Take 1 tablet (4 mg total) by mouth every 6 (six) hours as needed for nausea or vomiting. 30 tablet 3  . PARoxetine Mesylate (BRISDELLE PO) Take 1 tablet by mouth at bedtime.    . potassium chloride SA (K-DUR,KLOR-CON) 20 MEQ tablet Take 1 tablet (20 mEq total) by mouth daily. 90 tablet 1  . simvastatin (ZOCOR) 10 MG tablet Take 10 mg by mouth daily.    .Marland Kitchenvenlafaxine (EFFEXOR) 37.5 MG tablet Take 1 tablet (  37.5 mg total) by mouth daily. 30 tablet 6  . nicotine polacrilex (NICORETTE) 2 MG gum Take 2 mg by mouth 5 (five) times daily.      No current facility-administered medications for this visit.   Facility-Administered Medications Ordered in Other Visits  Medication Dose Route Frequency Provider Last Rate Last Dose  . sodium chloride 0.9 % injection 10 mL  10 mL Intracatheter PRN Forest Gleason, MD   10 mL at 02/04/15 1027    OBJECTIVE:  Filed Vitals:   06/24/15 1016  BP: 132/82  Pulse: 97  Temp: 97.7 F (36.5 C)  Resp: 19     Body mass index is 26.12 kg/(m^2).    ECOG FS:0 - Asymptomatic  PHYSICAL EXAM: GENERAL:  Well developed, well nourished, sitting comfortably in the exam room in no acute distress. MENTAL STATUS:  Alert and oriented to person, place and time. HEAD:   Normocephalic, atraumatic, face symmetric, no Cushingoid features. EYES:    Pupils equal round and reactive to light and accomodation.  No conjunctivitis or scleral icterus. ENT:  Oropharynx clear without lesion.  Tongue normal.  Mucous membranes moist.  RESPIRATORY:  Clear to auscultation without rales, wheezes or rhonchi. CARDIOVASCULAR:  Regular rate and rhythm without murmur, rub or gallop. BREAST:  Right breast : Status post mastectomy.  No evidence of recurrent disease skin changes or nipple discharge.  Left breast without masses, skin changes or nipple discharge. ABDOMEN:  Soft, non-tender, with active bowel sounds, and no hepatosplenomegaly.  No masses. BACK:  No CVA tenderness.  No tenderness on percussion of the back or rib cage. SKIN:  No rashes, ulcers or lesions. EXTREMITIES: No edema, no skin discoloration or tenderness.  No palpable cords. LYMPH NODES: No palpable cervical, supraclavicular, axillary or inguinal adenopathy  NEUROLOGICAL: Unremarkable. PSYCH:  Appropriate.   LAB RESULTS:  No visits with results within 3 Day(s) from this visit. Latest known visit with results is:  Hospital Outpatient Visit on 06/14/2015  Component Date Value Ref Range Status  . Glucose-Capillary 06/14/2015 91  65 - 99 mg/dL Final    Lab Results  Component Value Date   LABCA2 20.3 06/11/2014     STUDIES: Nm Pet Image Restag (ps) Skull Base To Thigh  06/14/2015  CLINICAL DATA:  Subsequent Treatment strategy for lung cancer. EXAM: NUCLEAR MEDICINE PET SKULL BASE TO THIGH TECHNIQUE: 11.7 mCi F-18 FDG was injected intravenously. Full-ring PET imaging was performed from the skull base to thigh after the radiotracer. CT data was obtained and used for attenuation correction and anatomic localization. FASTING BLOOD GLUCOSE:  Value: 91 mg/dl COMPARISON:  PET-CT 02/01/2015 FINDINGS: NECK No hypermetabolic lymph nodes in the neck. CHEST The left upper lobe lung mass has decreased in size since the prior CT scan. It previously measured 27 x 24 mm and now measures 14 x 14 mm. Prior SUV max was 24. It is now 7.7. The pleural-based lesion at the left lung base is also slightly smaller. It measures a maximum of 18 mm and was  previously 20.5 mm. SUV max is 20.8. This was previously 17.9. Extensive interstitial and airspace disease and both lungs is hypermetabolic with SUV max of 2.58. This is a new finding and could be due to radiation, interstitial spread of tumor or infection. The mediastinal and hilar adenopathy has improved since the prior study significantly. The right upper mediastinal lesion is smaller and decreased metabolic activity at 5.4 compared to 15.3. Hypermetabolic anterior rib fractures are noted bilaterally. These are likely  pathologic and could be related to radiation. ABDOMEN/PELVIS No abnormal hypermetabolic activity within the liver, pancreas, adrenal glands, or spleen. No hypermetabolic lymph nodes in the abdomen or pelvis. Stable enlarged fibroid uterus. SKELETON No focal hypermetabolic activity to suggest skeletal metastasis. IMPRESSION: 1. Interval decrease in size of the left upper lobe lung mass and decreased metabolic activity (64-3.8). 2. Slight decreased size of the left pleural-based lesion. Metabolic activity is stable to slightly increased. 3. Significant decrease in size and metabolic activity of the mediastinal and hilar lymphadenopathy. 4. New fairly extensive interstitial and airspace disease in lungs. This could be due to radiation or interstitial spread of tumor. However, given the fact that everything else appears improved, this is most likely an infectious process. A repeat chest CT after appropriate treatment (4-6 weeks) may be helpful for further evaluation. 5. Stable bilateral anterior rib fractures with hypermetabolism. 6. No findings for metastatic disease involving the abdomen/pelvis. Electronically Signed   By: Marijo Sanes M.D.   On: 06/14/2015 10:26    ASSESSMENT: 1 left upper lobe lung mass with mediastinal lymph node along with contralateral hilar lymphadenopathy both positive for metastatic adenocarcinoma T1 N3 M0 tumor stage IIIB 2.  Previous history of right upper lobe lung  cancer status post resection T1 N0 M0 tumor 3.  History of carcinoma breast.  Right breast cancer status post chemoradiation therapy on anti-hormonal therapy. Repeat CT scan has been reviewed and most likely extensive airspace disease is secondary to radiation pneumonitis This has been reviewed independently and reviewed with the patient MEDICAL DECISION MAKING:  Cancer of lung most likely what changes seen on CT scan are radiation pneumonitis Patient  was started on prednisone If there is no improvement in coming few weeks then bronchoscopy can be planned CT scan has been reviewed independently and reviewed with the patient  All lab data has been reviewed and will continue chemotherapy cycle 4 and 5.  And patient will be reevaluated at cycle 6.  Patient is undergoing split course of radiation therapy   Malignant neoplasm of female breast   Staging form: Breast, AJCC 7th Edition     Clinical: Stage IIIA (T3, N1, M0) - Signed by Forest Gleason, MD on 01/26/2015 Malignant neoplasm of upper lobe, bronchus or lung   Staging form: Lung, AJCC 7th Edition     Clinical: Stage IA (T1b, N0, M0) - Signed by Forest Gleason, MD on 01/26/2015   Forest Gleason, MD   06/24/2015 10:59 AM

## 2015-06-25 ENCOUNTER — Inpatient Hospital Stay: Payer: No Typology Code available for payment source

## 2015-06-30 ENCOUNTER — Encounter: Payer: Self-pay | Admitting: Oncology

## 2015-07-15 ENCOUNTER — Ambulatory Visit: Payer: 59 | Admitting: Oncology

## 2015-07-15 ENCOUNTER — Other Ambulatory Visit: Payer: 59

## 2015-07-17 ENCOUNTER — Encounter: Payer: Self-pay | Admitting: Oncology

## 2015-07-17 ENCOUNTER — Inpatient Hospital Stay: Payer: No Typology Code available for payment source | Attending: Oncology

## 2015-07-17 ENCOUNTER — Inpatient Hospital Stay (HOSPITAL_BASED_OUTPATIENT_CLINIC_OR_DEPARTMENT_OTHER): Payer: No Typology Code available for payment source | Admitting: Oncology

## 2015-07-17 VITALS — BP 124/72 | HR 114 | Temp 97.8°F | Wt 145.1 lb

## 2015-07-17 DIAGNOSIS — E119 Type 2 diabetes mellitus without complications: Secondary | ICD-10-CM | POA: Diagnosis not present

## 2015-07-17 DIAGNOSIS — Z87891 Personal history of nicotine dependence: Secondary | ICD-10-CM | POA: Insufficient documentation

## 2015-07-17 DIAGNOSIS — C3412 Malignant neoplasm of upper lobe, left bronchus or lung: Secondary | ICD-10-CM | POA: Diagnosis present

## 2015-07-17 DIAGNOSIS — R0902 Hypoxemia: Secondary | ICD-10-CM

## 2015-07-17 DIAGNOSIS — Z170 Estrogen receptor positive status [ER+]: Secondary | ICD-10-CM | POA: Diagnosis not present

## 2015-07-17 DIAGNOSIS — R0602 Shortness of breath: Secondary | ICD-10-CM

## 2015-07-17 DIAGNOSIS — Z9981 Dependence on supplemental oxygen: Secondary | ICD-10-CM | POA: Diagnosis not present

## 2015-07-17 DIAGNOSIS — C3431 Malignant neoplasm of lower lobe, right bronchus or lung: Secondary | ICD-10-CM | POA: Insufficient documentation

## 2015-07-17 DIAGNOSIS — Z9011 Acquired absence of right breast and nipple: Secondary | ICD-10-CM | POA: Diagnosis not present

## 2015-07-17 DIAGNOSIS — M7989 Other specified soft tissue disorders: Secondary | ICD-10-CM

## 2015-07-17 DIAGNOSIS — Z853 Personal history of malignant neoplasm of breast: Secondary | ICD-10-CM | POA: Diagnosis not present

## 2015-07-17 DIAGNOSIS — C3400 Malignant neoplasm of unspecified main bronchus: Secondary | ICD-10-CM

## 2015-07-17 DIAGNOSIS — I10 Essential (primary) hypertension: Secondary | ICD-10-CM | POA: Insufficient documentation

## 2015-07-17 DIAGNOSIS — Z79899 Other long term (current) drug therapy: Secondary | ICD-10-CM

## 2015-07-17 DIAGNOSIS — C3490 Malignant neoplasm of unspecified part of unspecified bronchus or lung: Secondary | ICD-10-CM

## 2015-07-17 LAB — CBC WITH DIFFERENTIAL/PLATELET
BASOS PCT: 1 %
Basophils Absolute: 0.2 10*3/uL — ABNORMAL HIGH (ref 0–0.1)
EOS ABS: 0.1 10*3/uL (ref 0–0.7)
Eosinophils Relative: 1 %
HEMATOCRIT: 38.6 % (ref 35.0–47.0)
HEMOGLOBIN: 12.6 g/dL (ref 12.0–16.0)
LYMPHS ABS: 0.4 10*3/uL — AB (ref 1.0–3.6)
Lymphocytes Relative: 2 %
MCH: 30.7 pg (ref 26.0–34.0)
MCHC: 32.6 g/dL (ref 32.0–36.0)
MCV: 94.3 fL (ref 80.0–100.0)
MONOS PCT: 3 %
Monocytes Absolute: 0.6 10*3/uL (ref 0.2–0.9)
NEUTROS ABS: 17.6 10*3/uL — AB (ref 1.4–6.5)
NEUTROS PCT: 93 %
Platelets: 261 10*3/uL (ref 150–440)
RBC: 4.09 MIL/uL (ref 3.80–5.20)
RDW: 16.2 % — ABNORMAL HIGH (ref 11.5–14.5)
WBC: 18.9 10*3/uL — AB (ref 3.6–11.0)

## 2015-07-17 LAB — COMPREHENSIVE METABOLIC PANEL
ALBUMIN: 3.5 g/dL (ref 3.5–5.0)
ALK PHOS: 93 U/L (ref 38–126)
ALT: 19 U/L (ref 14–54)
ANION GAP: 7 (ref 5–15)
AST: 16 U/L (ref 15–41)
BILIRUBIN TOTAL: 0.6 mg/dL (ref 0.3–1.2)
BUN: 19 mg/dL (ref 6–20)
CALCIUM: 9.5 mg/dL (ref 8.9–10.3)
CO2: 27 mmol/L (ref 22–32)
CREATININE: 0.89 mg/dL (ref 0.44–1.00)
Chloride: 101 mmol/L (ref 101–111)
GFR calc Af Amer: >60 mL/min (ref >60)
GFR calc non Af Amer: >60 mL/min (ref >60)
GLUCOSE: 172 mg/dL — AB (ref 65–99)
Potassium: 3.8 mmol/L (ref 3.5–5.1)
SODIUM: 135 mmol/L (ref 135–145)
TOTAL PROTEIN: 7.2 g/dL (ref 6.5–8.1)

## 2015-07-17 MED ORDER — FLUTICASONE-SALMETEROL 250-50 MCG/DOSE IN AEPB: 1.0000 | INHALATION_SPRAY | Freq: Two times a day (BID) | RESPIRATORY_TRACT | Status: DC

## 2015-07-17 MED ORDER — TIOTROPIUM BROMIDE MONOHYDRATE 18 MCG IN CAPS: 18.0000 ug | ORAL_CAPSULE | Freq: Every day | RESPIRATORY_TRACT | Status: DC

## 2015-07-17 NOTE — Progress Notes (Signed)
Difficulty breathing past 2 weeks even with taking prednisone.  Patient's O2 81% after walking from lobby. Audible wheezing.  Initiated O2 2-L - increased to 95%.  Left arm swollen.

## 2015-07-17 NOTE — Progress Notes (Signed)
[place] @ [facility] [phone]  Fax:([phone]) [place]: [phone]  MR#: [id number]  LFY#:[id number]  Patient Care Team: [name], MD as PCP - General (Family Medicine) [name], MD (General Surgery) [name], RN as [place] [name], MD as Referring Physician (Family Medicine)  CHIEF COMPLAINT:  Chief Complaint  Patient presents with  . OTHER   Oncology History   24. 54 year old female status post recent left breast cancer in 2005  2. Right breast cancer, locally advanced stage IIIa (pyT3, N1, M0), invasive mammary carcinoma with mucin production. Status post right modified radical mastectomy for 8 cm lesion with one of 4 sentinel lymph nodes positive for metastatic disease. Tumor is ER positive PR negative HER-2/neu not overexpressed. 3. Completed neoadjuvant chemotherapy with chest wall radiation therapy,  Finished on October 21, 2013. 4. Started letrazole 2.5 mg by mouth daily from October 24, 2013. 5. Carcinoma of lung.  Right upper lobe, status post right upper lobectomy, T1BN0M0. 6.  Left upper lobe mass with mediastinal lymph node biopsies positive for poorly differentiated adenocarcinoma T1 N1 M0 tumor III Starting chemoradiation therapy in July of 2016 (second primary) Patient had EUS in June of 2016.  Bilateral hilar adenopathy which has been biopsied was positive for metastatic adenocarcinoma consistent with lung primary Starting chemoradiation therapy in July of 2016 7.  Has finished 6 cycles of chemotherapy with carboplatinum and Taxol concurrent with radiation therapy on April 10, 2015       INTERVAL HISTORY:  54 year old African-American lady came today if as an acute heart on.  Has increasing shortness of breath started few days ago.  Also has developed left upper extremity swelling.\Patient is not using any oxygen at present time.  Oxygen saturation has dropped  to 81% at rest.  Feeling weak.  Tired. Declining performance status.  As per HPI. Otherwise, a complete review of systems is negatve.  Review of systems  general status: Patient is feeling weak and tired.  No change in a performance status.  No chills.  No fever. HEENT: Difficulty swallowing. Lungs: Increasing shortness of breath.  Cough.  No hemoptysis.  Oxygen saturation dropped to 81%. Cardiac: No chest pain or paroxysmal nocturnal dyspnea GI: No nausea no vomiting no diarrhea no abdominal pain Skin: No rash Lower extremity no swelling Left upper extremity is swelling. Neurological system: No tingling.  No numbness.  No other focal signs Musculoskeletal system no bony pains    PAST MEDICAL HISTORY: Past Medical History  Diagnosis Date  . Unspecified essential hypertension   . Kidney failure   . Personal history of tobacco use, presenting hazards to health   . Cataract   . Personal history of malignant neoplasm of breast   . Breast screening, unspecified   . Special screening for malignant neoplasms, colon   . Diabetes mellitus without complication ([place])   . MDRO (multiple drug resistant organisms) resistance   . H. pylori duodenitis   . Complication of anesthesia     BP DROPPED DURING LOBECTOMY IN 2014  . Cancer [facility]) 2001    left breast cancer s/p L/SN/R in 2001  . Cancer [facility]) 2014    right breast invasive CA, Er pos,Pr neg, Her 2 neg.  . Lung cancer, upper lobe ([place]) 2014    right upper lobe  . Breast cancer ([place])     PAST SURGICAL HISTORY: Past Surgical History  Procedure Laterality Date  . Intercostal nerve  block  2005  . Cataract extraction w/ intraocular lens implant  2005  . Carpal tunnel release Right 2011  . Insertion central venous access device w/ subcutaneous port  12-07-12  . Breast lumpectomy Left 2001  . Breast surgery Right 2014    total mastectomy  . Right lung upper lobectomy  03/2013  . Lobectomy Right   . Cataract extraction    .  Mastectomy Right   . Colonoscopy    . Endobronchial ultrasound N/A 02/05/2015    Procedure: ENDOBRONCHIAL ULTRASOUND;  Surgeon: Flora Lipps, MD;  Location: [facility] ORS;  Service: Cardiopulmonary;  Laterality: N/A;    FAMILY HISTORY Family History  Problem Relation Age of Onset  . Diabetes Other   . Hyperlipidemia Other   . Hypertension Other         ADVANCED DIRECTIVES: Patient does not have any advanced healthcare directive. Information has been given.   HEALTH MAINTENANCE: Social History  Substance Use Topics  . Smoking status: Former Smoker -- 0.50 packs/day for 30 years    Types: Cigarettes    Quit date: 02/03/2013  . Smokeless tobacco: Never Used     Comment: using 42m nicorette gum- 1/2 piece, 10 times per day  . Alcohol Use: No      Allergies  Allergen Reactions  . Metformin Diarrhea and Nausea Only  . Other Rash    Sage Wipes    Current Outpatient Prescriptions  Medication Sig Dispense Refill  . acetaminophen (TYLENOL) 325 MG tablet Take 650 mg by mouth every 6 (six) hours as needed for pain.    .Marland Kitchenesomeprazole (NEXIUM) 20 MG capsule Take 1 capsule (20 mg total) by mouth every morning. 30 capsule 0  . gabapentin (NEURONTIN) 300 MG capsule Take 300 mg by mouth as needed.    .Marland KitchenglipiZIDE (GLUCOTROL XL) 5 MG 24 hr tablet Take by mouth.    . hydrochlorothiazide (HYDRODIURIL) 25 MG tablet Take 12.5 mg by mouth daily.     . Multiple Vitamins-Minerals (MULTIVITAMIN PO) Take by mouth.    . naproxen sodium (ANAPROX) 220 MG tablet Take 220 mg by mouth every 8 (eight) hours as needed.    . nicotine polacrilex (NICORETTE) 2 MG gum Take 2 mg by mouth 5 (five) times daily.     . ondansetron (ZOFRAN) 4 MG tablet Take 1 tablet (4 mg total) by mouth every 6 (six) hours as needed for nausea or vomiting. 30 tablet 3  . PARoxetine Mesylate (BRISDELLE PO) Take 1 tablet by mouth at bedtime.    . potassium chloride SA (K-DUR,KLOR-CON) 20 MEQ tablet Take 1 tablet (20 mEq total) by mouth  daily. 90 tablet 1  . predniSONE (DELTASONE) 5 MG tablet Start at 629mand taper by 2.5 mg every other day until [id number] tablet 0  . simvastatin (ZOCOR) 10 MG tablet Take 10 mg by mouth daily.    . vMarland Kitchennlafaxine (EFFEXOR) 37.5 MG tablet Take 1 tablet (37.5 mg total) by mouth daily. 30 tablet 6   No current facility-administered medications for this visit.   Facility-Administered Medications Ordered in Other Visits  Medication Dose Route Frequency Provider Last Rate Last Dose  . sodium chloride 0.9 % injection 10 mL  10 mL Intracatheter PRN JanForest GleasonD   10 mL at 02/04/15 1027    OBJECTIVE:  Filed Vitals:   07/17/15 1039  BP: 124/72  Pulse: 114  Temp: 97.8 F (36.6 C)     Body mass index is 26.53 kg/(m^2).  ECOG FS:0 - Asymptomatic  PHYSICAL EXAM: GENERAL:  Performance status is declining Patient is in mild distress MENTAL STATUS:  Alert and oriented to person, place and time. HEAD:   Normocephalic, atraumatic, face symmetric, no Cushingoid features. EYES:    Pupils equal round and reactive to light and accomodation.  No conjunctivitis or scleral icterus. ENT:  Oropharynx clear without lesion.  Tongue normal. Mucous membranes moist.  RESPIRATORY wheezing and rhonchi on both sides CARDIOVASCULAR:  Regular rate and rhythm without murmur, rub or gallop. BREAST:  Right breast : Status post mastectomy.  No evidence of recurrent disease skin changes or nipple discharge.  Left breast without masses, skin changes or nipple discharge. ABDOMEN:  Soft, non-tender, with active bowel sounds, and no hepatosplenomegaly.  No masses. BACK:  No CVA tenderness.  No tenderness on percussion of the back or rib cage. SKIN:  No rashes, ulcers or lesions. EXTREMITIES: No edema, no skin discoloration or tenderness.  No palpable cords. Swelling of the left upper extremity LYMPH NODES: No palpable cervical, supraclavicular, axillary or inguinal adenopathy  NEUROLOGICAL: Unremarkable. PSYCH:   Appropriate.   LAB RESULTS:  Appointment on 07/17/2015  Component Date Value Ref Range Status  . WBC 07/17/2015 18.9* 3.6 - 11.0 K/uL Final  . RBC 07/17/2015 4.09  3.80 - 5.20 MIL/uL Final  . Hemoglobin 07/17/2015 12.6  12.0 - 16.0 g/dL Final  . HCT 07/17/2015 38.6  35.0 - 47.0 % Final  . MCV 07/17/2015 94.3  80.0 - 100.0 fL Final  . MCH 07/17/2015 30.7  26.0 - 34.0 pg Final  . MCHC 07/17/2015 32.6  32.0 - 36.0 g/dL Final  . RDW 07/17/2015 16.2* 11.5 - 14.5 % Final  . Platelets 07/17/2015 261  150 - 440 K/uL Final  . Neutrophils Relative % 07/17/2015 93   Final  . Neutro Abs 07/17/2015 17.6* 1.4 - 6.5 K/uL Final  . Lymphocytes Relative 07/17/2015 2   Final  . Lymphs Abs 07/17/2015 0.4* 1.0 - 3.6 K/uL Final  . Monocytes Relative 07/17/2015 3   Final  . Monocytes Absolute 07/17/2015 0.6  0.2 - 0.9 K/uL Final  . Eosinophils Relative 07/17/2015 1   Final  . Eosinophils Absolute 07/17/2015 0.1  0 - 0.7 K/uL Final  . Basophils Relative 07/17/2015 1   Final  . Basophils Absolute 07/17/2015 0.2* 0 - 0.1 K/uL Final  . Sodium 07/17/2015 135  135 - 145 mmol/L Final  . Potassium 07/17/2015 3.8  3.5 - 5.1 mmol/L Final  . Chloride 07/17/2015 101  101 - 111 mmol/L Final  . CO2 07/17/2015 27  22 - 32 mmol/L Final  . Glucose, Bld 07/17/2015 172* 65 - 99 mg/dL Final  . BUN 07/17/2015 19  6 - 20 mg/dL Final  . Creatinine, Ser 07/17/2015 0.89  0.44 - 1.00 mg/dL Final  . Calcium 07/17/2015 9.5  8.9 - 10.3 mg/dL Final  . Total Protein 07/17/2015 7.2  6.5 - 8.1 g/dL Final  . Albumin 07/17/2015 3.5  3.5 - 5.0 g/dL Final  . AST 07/17/2015 16  15 - 41 U/L Final  . ALT 07/17/2015 19  14 - 54 U/L Final  . Alkaline Phosphatase 07/17/2015 93  38 - 126 U/L Final  . Total Bilirubin 07/17/2015 0.6  0.3 - 1.2 mg/dL Final  . GFR calc non Af Amer 07/17/2015 >60  >60 mL/min Final  . GFR calc Af Amer 07/17/2015 >60  >60 mL/min Final   Comment: (NOTE) The eGFR has been calculated using the CKD  EPI  equation. This calculation has not been validated in all clinical situations. eGFR's persistently <60 mL/min signify possible Chronic Kidney Disease.   . Anion gap 07/17/2015 7  5 - 15 Final    Lab Results  Component Value Date   LABCA2 20.3 06/11/2014     STUDIES: No results found.  ASSESSMENT: Because of increasing shortness of breath and hypoxia ACT scan was obtained.  Which revealed the bilateral bronchoalveolar infiltration.  Oxygen saturation has dropped so patient was advised to use oxygen at home. Patient was called to buy prednisone and Levaquin also use inhaler with'  spiriva  And  Advair however later on we received a phone call that patient cannot buy any of the medication.  Left upper extremity swelling also would need ultrasound to rule out any deep vein thrombosis  Patient might help with me again in the hospital if agreeable for intravenous steroid, IV antibiotics and further evaluation with pulmonologist.  CT scan has been reviewed independently and shows extensive pulmonary infiltrate which very well could be infection versus progressive tumor versus radiation pneumonitis   Situation has been discussed with pulmonologist that in the event of admission to the hospital and they will evaluate this patient  All lab data has been reviewed and will continue chemotherapy cycle 4 and 5.  And patient will be reevaluated at cycle 6.  Patient is undergoing split course of radiation therapy   Malignant neoplasm of female breast   Staging form: Breast, AJCC 7th Edition     Clinical: Stage IIIA (T3, N1, M0) - Signed by Forest Gleason, MD on 01/26/2015 Malignant neoplasm of upper lobe, bronchus or lung   Staging form: Lung, AJCC 7th Edition     Clinical: Stage IA (T1b, N0, M0) - Signed by Forest Gleason, MD on 01/26/2015   Forest Gleason, MD   07/17/2015 11:07 AM

## 2015-07-18 ENCOUNTER — Telehealth: Payer: Self-pay | Admitting: *Deleted

## 2015-07-18 ENCOUNTER — Ambulatory Visit
Admission: RE | Admit: 2015-07-18 | Discharge: 2015-07-18 | Disposition: A | Discharge status: Home / Self Care | Payer: No Typology Code available for payment source | Source: Ambulatory Visit | Attending: Oncology | Admitting: Oncology

## 2015-07-18 ENCOUNTER — Inpatient Hospital Stay: Payer: No Typology Code available for payment source

## 2015-07-18 DIAGNOSIS — Z95828 Presence of other vascular implants and grafts: Secondary | ICD-10-CM

## 2015-07-18 DIAGNOSIS — C3400 Malignant neoplasm of unspecified main bronchus: Secondary | ICD-10-CM

## 2015-07-18 DIAGNOSIS — R0602 Shortness of breath: Secondary | ICD-10-CM

## 2015-07-18 DIAGNOSIS — C3412 Malignant neoplasm of upper lobe, left bronchus or lung: Secondary | ICD-10-CM | POA: Diagnosis not present

## 2015-07-18 MED ORDER — IOHEXOL 350 MG/ML SOLN
100.0000 mL | Freq: Once | INTRAVENOUS | Status: AC | PRN
  Administered 2015-07-18: 100 mL via INTRAVENOUS

## 2015-07-18 MED ORDER — LEVOFLOXACIN 500 MG PO TABS: 500.0000 mg | ORAL_TABLET | Freq: Every day | ORAL | Status: DC

## 2015-07-18 MED ORDER — HEPARIN SOD (PORK) LOCK FLUSH 100 UNIT/ML IV SOLN
500.0000 [IU] | Freq: Once | INTRAVENOUS | Status: AC
  Administered 2015-07-18: 500 [IU] via INTRAVENOUS

## 2015-07-18 MED ORDER — PREDNISONE 50 MG PO TABS: 50.0000 mg | ORAL_TABLET | Freq: Every day | ORAL | Status: DC

## 2015-07-18 MED ORDER — SODIUM CHLORIDE 0.9 % IJ SOLN
10.0000 mL | INTRAMUSCULAR | Status: DC | PRN
  Administered 2015-07-18: 10 mL via INTRAVENOUS
  Filled 2015-07-18: qty 10

## 2015-07-18 MED ORDER — HEPARIN SOD (PORK) LOCK FLUSH 100 UNIT/ML IV SOLN
INTRAVENOUS | Status: AC
  Filled 2015-07-18: qty 5

## 2015-07-18 NOTE — Telephone Encounter (Signed)
Per MD need to start levaquin '500mg'$  daily x10 days and prednisone '50mg'$  daily until next appt on 11/28.

## 2015-07-18 NOTE — Telephone Encounter (Signed)
Spoke with patient who said does not have insurance and cannot afford prescriptions at this time. Informed pt that will discuss with Barnabas Lister to see if can find help for her. Inform pt will call her back tomorrow morning after speaking with Barnabas Lister.

## 2015-07-18 NOTE — Progress Notes (Signed)
Radiology called and requested [place] to access port for CT scan. Accessed port and told pt to come back to cancer center to have needle flushed and removed.

## 2015-07-19 ENCOUNTER — Inpatient Hospital Stay
Admission: AD | Admit: 2015-07-19 | Discharge: 2015-09-01 | DRG: 166 | Disposition: E | Payer: 59 | Source: Ambulatory Visit | Attending: Internal Medicine | Admitting: Internal Medicine

## 2015-07-19 ENCOUNTER — Inpatient Hospital Stay (HOSPITAL_COMMUNITY): Payer: No Typology Code available for payment source | Admitting: Family Medicine

## 2015-07-19 ENCOUNTER — Other Ambulatory Visit: Payer: Self-pay | Admitting: Family Medicine

## 2015-07-19 ENCOUNTER — Inpatient Hospital Stay: Payer: No Typology Code available for payment source

## 2015-07-19 ENCOUNTER — Other Ambulatory Visit: Payer: Self-pay | Admitting: Internal Medicine

## 2015-07-19 VITALS — BP 109/68 | HR 84 | Temp 97.1°F | Resp 18 | Wt 144.8 lb

## 2015-07-19 DIAGNOSIS — Z170 Estrogen receptor positive status [ER+]: Secondary | ICD-10-CM

## 2015-07-19 DIAGNOSIS — Z87891 Personal history of nicotine dependence: Secondary | ICD-10-CM

## 2015-07-19 DIAGNOSIS — J700 Acute pulmonary manifestations due to radiation: Secondary | ICD-10-CM | POA: Diagnosis present

## 2015-07-19 DIAGNOSIS — R0600 Dyspnea, unspecified: Secondary | ICD-10-CM

## 2015-07-19 DIAGNOSIS — Z66 Do not resuscitate: Secondary | ICD-10-CM | POA: Diagnosis present

## 2015-07-19 DIAGNOSIS — Z9221 Personal history of antineoplastic chemotherapy: Secondary | ICD-10-CM

## 2015-07-19 DIAGNOSIS — B370 Candidal stomatitis: Secondary | ICD-10-CM

## 2015-07-19 DIAGNOSIS — F419 Anxiety disorder, unspecified: Secondary | ICD-10-CM | POA: Diagnosis present

## 2015-07-19 DIAGNOSIS — Z515 Encounter for palliative care: Secondary | ICD-10-CM | POA: Diagnosis present

## 2015-07-19 DIAGNOSIS — C3431 Malignant neoplasm of lower lobe, right bronchus or lung: Secondary | ICD-10-CM | POA: Diagnosis not present

## 2015-07-19 DIAGNOSIS — C3412 Malignant neoplasm of upper lobe, left bronchus or lung: Secondary | ICD-10-CM

## 2015-07-19 DIAGNOSIS — J9621 Acute and chronic respiratory failure with hypoxia: Secondary | ICD-10-CM | POA: Diagnosis present

## 2015-07-19 DIAGNOSIS — Z9849 Cataract extraction status, unspecified eye: Secondary | ICD-10-CM

## 2015-07-19 DIAGNOSIS — Z79899 Other long term (current) drug therapy: Secondary | ICD-10-CM

## 2015-07-19 DIAGNOSIS — E1165 Type 2 diabetes mellitus with hyperglycemia: Secondary | ICD-10-CM | POA: Diagnosis present

## 2015-07-19 DIAGNOSIS — I82A12 Acute embolism and thrombosis of left axillary vein: Secondary | ICD-10-CM | POA: Diagnosis present

## 2015-07-19 DIAGNOSIS — Z888 Allergy status to other drugs, medicaments and biological substances status: Secondary | ICD-10-CM

## 2015-07-19 DIAGNOSIS — E099 Drug or chemical induced diabetes mellitus without complications: Secondary | ICD-10-CM

## 2015-07-19 DIAGNOSIS — M25511 Pain in right shoulder: Secondary | ICD-10-CM | POA: Diagnosis present

## 2015-07-19 DIAGNOSIS — Z853 Personal history of malignant neoplasm of breast: Secondary | ICD-10-CM

## 2015-07-19 DIAGNOSIS — Z9981 Dependence on supplemental oxygen: Secondary | ICD-10-CM

## 2015-07-19 DIAGNOSIS — Z902 Acquired absence of lung [part of]: Secondary | ICD-10-CM

## 2015-07-19 DIAGNOSIS — C50512 Malignant neoplasm of lower-outer quadrant of left female breast: Secondary | ICD-10-CM

## 2015-07-19 DIAGNOSIS — M7989 Other specified soft tissue disorders: Secondary | ICD-10-CM

## 2015-07-19 DIAGNOSIS — J181 Lobar pneumonia, unspecified organism: Secondary | ICD-10-CM

## 2015-07-19 DIAGNOSIS — J440 Chronic obstructive pulmonary disease with acute lower respiratory infection: Principal | ICD-10-CM | POA: Diagnosis present

## 2015-07-19 DIAGNOSIS — J90 Pleural effusion, not elsewhere classified: Secondary | ICD-10-CM | POA: Diagnosis not present

## 2015-07-19 DIAGNOSIS — R0602 Shortness of breath: Secondary | ICD-10-CM

## 2015-07-19 DIAGNOSIS — E785 Hyperlipidemia, unspecified: Secondary | ICD-10-CM | POA: Diagnosis present

## 2015-07-19 DIAGNOSIS — J9801 Acute bronchospasm: Secondary | ICD-10-CM | POA: Diagnosis present

## 2015-07-19 DIAGNOSIS — Z961 Presence of intraocular lens: Secondary | ICD-10-CM | POA: Diagnosis present

## 2015-07-19 DIAGNOSIS — J9690 Respiratory failure, unspecified, unspecified whether with hypoxia or hypercapnia: Secondary | ICD-10-CM

## 2015-07-19 DIAGNOSIS — C3490 Malignant neoplasm of unspecified part of unspecified bronchus or lung: Secondary | ICD-10-CM | POA: Insufficient documentation

## 2015-07-19 DIAGNOSIS — C3492 Malignant neoplasm of unspecified part of left bronchus or lung: Secondary | ICD-10-CM

## 2015-07-19 DIAGNOSIS — I10 Essential (primary) hypertension: Secondary | ICD-10-CM | POA: Diagnosis present

## 2015-07-19 DIAGNOSIS — R079 Chest pain, unspecified: Secondary | ICD-10-CM

## 2015-07-19 DIAGNOSIS — R0682 Tachypnea, not elsewhere classified: Secondary | ICD-10-CM | POA: Diagnosis present

## 2015-07-19 DIAGNOSIS — J189 Pneumonia, unspecified organism: Secondary | ICD-10-CM | POA: Diagnosis not present

## 2015-07-19 DIAGNOSIS — Z833 Family history of diabetes mellitus: Secondary | ICD-10-CM

## 2015-07-19 DIAGNOSIS — T380X5A Adverse effect of glucocorticoids and synthetic analogues, initial encounter: Secondary | ICD-10-CM | POA: Diagnosis present

## 2015-07-19 DIAGNOSIS — R062 Wheezing: Secondary | ICD-10-CM

## 2015-07-19 DIAGNOSIS — R000 Tachycardia, unspecified: Secondary | ICD-10-CM | POA: Diagnosis present

## 2015-07-19 DIAGNOSIS — Z9011 Acquired absence of right breast and nipple: Secondary | ICD-10-CM

## 2015-07-19 DIAGNOSIS — J9811 Atelectasis: Secondary | ICD-10-CM | POA: Diagnosis present

## 2015-07-19 DIAGNOSIS — Z8249 Family history of ischemic heart disease and other diseases of the circulatory system: Secondary | ICD-10-CM

## 2015-07-19 DIAGNOSIS — E119 Type 2 diabetes mellitus without complications: Secondary | ICD-10-CM

## 2015-07-19 DIAGNOSIS — Y842 Radiological procedure and radiotherapy as the cause of abnormal reaction of the patient, or of later complication, without mention of misadventure at the time of the procedure: Secondary | ICD-10-CM | POA: Diagnosis present

## 2015-07-19 DIAGNOSIS — C779 Secondary and unspecified malignant neoplasm of lymph node, unspecified: Secondary | ICD-10-CM | POA: Diagnosis present

## 2015-07-19 DIAGNOSIS — Z923 Personal history of irradiation: Secondary | ICD-10-CM

## 2015-07-19 DIAGNOSIS — C3411 Malignant neoplasm of upper lobe, right bronchus or lung: Secondary | ICD-10-CM | POA: Diagnosis present

## 2015-07-19 DIAGNOSIS — J701 Chronic and other pulmonary manifestations due to radiation: Secondary | ICD-10-CM

## 2015-07-19 DIAGNOSIS — C50911 Malignant neoplasm of unspecified site of right female breast: Secondary | ICD-10-CM | POA: Diagnosis present

## 2015-07-19 LAB — CBC WITH DIFFERENTIAL/PLATELET
Basophils Absolute: 0.1 10*3/uL (ref 0–0.1)
Basophils Relative: 0 %
EOS PCT: 0 %
Eosinophils Absolute: 0 10*3/uL (ref 0–0.7)
HCT: 37.3 % (ref 35.0–47.0)
Hemoglobin: 12 g/dL (ref 12.0–16.0)
LYMPHS ABS: 0.4 10*3/uL — AB (ref 1.0–3.6)
LYMPHS PCT: 2 %
MCH: 30.3 pg (ref 26.0–34.0)
MCHC: 32.1 g/dL (ref 32.0–36.0)
MCV: 94.4 fL (ref 80.0–100.0)
MONOS PCT: 3 %
Monocytes Absolute: 0.5 10*3/uL (ref 0.2–0.9)
Neutro Abs: 19.5 10*3/uL — ABNORMAL HIGH (ref 1.4–6.5)
Neutrophils Relative %: 95 %
PLATELETS: 247 10*3/uL (ref 150–440)
RBC: 3.95 MIL/uL (ref 3.80–5.20)
RDW: 15.6 % — ABNORMAL HIGH (ref 11.5–14.5)
WBC: 20.5 10*3/uL — AB (ref 3.6–11.0)

## 2015-07-19 LAB — COMPREHENSIVE METABOLIC PANEL
ALK PHOS: 98 U/L (ref 38–126)
ALT: 20 U/L (ref 14–54)
ANION GAP: 8 (ref 5–15)
AST: 19 U/L (ref 15–41)
Albumin: 3.4 g/dL — ABNORMAL LOW (ref 3.5–5.0)
BUN: 14 mg/dL (ref 6–20)
CALCIUM: 9.4 mg/dL (ref 8.9–10.3)
CHLORIDE: 101 mmol/L (ref 101–111)
CO2: 26 mmol/L (ref 22–32)
CREATININE: 0.67 mg/dL (ref 0.44–1.00)
Glucose, Bld: 254 mg/dL — ABNORMAL HIGH (ref 65–99)
Potassium: 3.9 mmol/L (ref 3.5–5.1)
Sodium: 135 mmol/L (ref 135–145)
Total Bilirubin: 0.7 mg/dL (ref 0.3–1.2)
Total Protein: 7.2 g/dL (ref 6.5–8.1)

## 2015-07-19 LAB — MAGNESIUM: MAGNESIUM: 2.1 mg/dL (ref 1.7–2.4)

## 2015-07-19 MED ORDER — LETROZOLE 2.5 MG PO TABS
2.5000 mg | ORAL_TABLET | Freq: Every day | ORAL | Status: DC
  Administered 2015-07-19 – 2015-07-31 (×13): 2.5 mg via ORAL
  Filled 2015-07-19 (×17): qty 1

## 2015-07-19 MED ORDER — PREDNISONE 50 MG PO TABS: 50.0000 mg | ORAL_TABLET | Freq: Every day | ORAL | Status: DC

## 2015-07-19 MED ORDER — SENNOSIDES-DOCUSATE SODIUM 8.6-50 MG PO TABS: 1.0000 | ORAL_TABLET | Freq: Every evening | ORAL | Status: DC | PRN

## 2015-07-19 MED ORDER — POTASSIUM CHLORIDE CRYS ER 20 MEQ PO TBCR
20.0000 meq | EXTENDED_RELEASE_TABLET | Freq: Every day | ORAL | Status: DC
  Administered 2015-07-19 – 2015-08-03 (×16): 20 meq via ORAL
  Filled 2015-07-19 (×18): qty 1

## 2015-07-19 MED ORDER — SODIUM CHLORIDE 0.9 % IV SOLN
INTRAVENOUS | Status: DC
  Administered 2015-07-19 – 2015-07-29 (×8): via INTRAVENOUS

## 2015-07-19 MED ORDER — SIMVASTATIN 20 MG PO TABS
10.0000 mg | ORAL_TABLET | Freq: Every day | ORAL | Status: DC
  Administered 2015-07-19 – 2015-08-03 (×16): 10 mg via ORAL
  Filled 2015-07-19 (×10): qty 1
  Filled 2015-07-19: qty 2
  Filled 2015-07-19: qty 0.5
  Filled 2015-07-19 (×5): qty 1

## 2015-07-19 MED ORDER — ENOXAPARIN SODIUM 100 MG/ML ~~LOC~~ SOLN
1.5000 mg/kg | SUBCUTANEOUS | Status: DC
  Administered 2015-07-19 – 2015-08-04 (×17): 100 mg via SUBCUTANEOUS
  Filled 2015-07-19 (×17): qty 1

## 2015-07-19 MED ORDER — PIPERACILLIN-TAZOBACTAM 3.375 G IVPB
3.3750 g | Freq: Three times a day (TID) | INTRAVENOUS | Status: AC
Start: 2015-07-19 — End: 2015-07-29
  Administered 2015-07-19 – 2015-07-29 (×29): 3.375 g via INTRAVENOUS
  Filled 2015-07-19 (×33): qty 50

## 2015-07-19 MED ORDER — VANCOMYCIN HCL 500 MG IV SOLR: 500.0000 mg | Freq: Three times a day (TID) | INTRAVENOUS | Status: DC

## 2015-07-19 MED ORDER — PANTOPRAZOLE SODIUM 40 MG PO TBEC
40.0000 mg | DELAYED_RELEASE_TABLET | Freq: Every day | ORAL | Status: DC
  Administered 2015-07-20 – 2015-07-29 (×10): 40 mg via ORAL
  Filled 2015-07-19 (×11): qty 1

## 2015-07-19 MED ORDER — PAROXETINE HCL 10 MG PO TABS
10.0000 mg | ORAL_TABLET | Freq: Every day | ORAL | Status: DC
  Administered 2015-07-19 – 2015-08-05 (×17): 10 mg via ORAL
  Filled 2015-07-19 (×18): qty 1

## 2015-07-19 MED ORDER — ACETAMINOPHEN 325 MG PO TABS
650.0000 mg | ORAL_TABLET | ORAL | Status: DC | PRN
  Administered 2015-07-22 – 2015-07-30 (×3): 650 mg via ORAL
  Filled 2015-07-19 (×3): qty 2

## 2015-07-19 MED ORDER — VANCOMYCIN HCL IN DEXTROSE 750-5 MG/150ML-% IV SOLN
750.0000 mg | Freq: Two times a day (BID) | INTRAVENOUS | Status: DC
  Administered 2015-07-19 – 2015-07-20 (×3): 750 mg via INTRAVENOUS
  Filled 2015-07-19 (×4): qty 150

## 2015-07-19 MED ORDER — LEVOFLOXACIN 500 MG PO TABS: 500.0000 mg | ORAL_TABLET | Freq: Every day | ORAL | Status: DC

## 2015-07-19 MED ORDER — HYDROCODONE-ACETAMINOPHEN 5-325 MG PO TABS
1.0000 | ORAL_TABLET | ORAL | Status: DC | PRN
  Administered 2015-07-19 – 2015-07-24 (×4): 1 via ORAL
  Filled 2015-07-19 (×4): qty 1

## 2015-07-19 MED ORDER — GUAIFENESIN-DM 100-10 MG/5ML PO SYRP
10.0000 mL | ORAL_SOLUTION | ORAL | Status: DC | PRN
  Administered 2015-07-19 – 2015-07-31 (×6): 10 mL via ORAL
  Filled 2015-07-19 (×6): qty 10

## 2015-07-19 MED ORDER — ALBUTEROL SULFATE HFA 108 (90 BASE) MCG/ACT IN AERS: 2.0000 | INHALATION_SPRAY | Freq: Three times a day (TID) | RESPIRATORY_TRACT | Status: DC | PRN

## 2015-07-19 MED ORDER — VENLAFAXINE HCL 37.5 MG PO TABS
37.5000 mg | ORAL_TABLET | Freq: Every day | ORAL | Status: DC
  Filled 2015-07-19 (×2): qty 1

## 2015-07-19 MED ORDER — ENOXAPARIN SODIUM 40 MG/0.4ML ~~LOC~~ SOLN: 40.0000 mg | SUBCUTANEOUS | Status: DC

## 2015-07-19 MED ORDER — GLIPIZIDE ER 5 MG PO TB24
5.0000 mg | ORAL_TABLET | Freq: Every day | ORAL | Status: DC
  Administered 2015-07-19 – 2015-08-02 (×14): 5 mg via ORAL
  Filled 2015-07-19 (×14): qty 1

## 2015-07-19 MED ORDER — HYDROCHLOROTHIAZIDE 25 MG PO TABS
12.5000 mg | ORAL_TABLET | Freq: Every day | ORAL | Status: DC
  Administered 2015-07-19 – 2015-08-04 (×17): 12.5 mg via ORAL
  Filled 2015-07-19 (×15): qty 1
  Filled 2015-07-19: qty 2
  Filled 2015-07-19 (×2): qty 1

## 2015-07-19 MED ORDER — GABAPENTIN 300 MG PO CAPS
300.0000 mg | ORAL_CAPSULE | Freq: Three times a day (TID) | ORAL | Status: DC
  Filled 2015-07-19: qty 1

## 2015-07-19 MED ORDER — CALCIUM CITRATE-VITAMIN D 500-400 MG-UNIT PO CHEW
1.0000 | CHEWABLE_TABLET | Freq: Two times a day (BID) | ORAL | Status: DC
  Administered 2015-07-19 – 2015-07-28 (×19): 1 via ORAL
  Filled 2015-07-19 (×24): qty 1

## 2015-07-19 MED ORDER — VANCOMYCIN HCL IN DEXTROSE 1-5 GM/200ML-% IV SOLN
1000.0000 mg | Freq: Once | INTRAVENOUS | Status: AC
  Administered 2015-07-19: 1000 mg via INTRAVENOUS
  Filled 2015-07-19: qty 200

## 2015-07-19 NOTE — Progress Notes (Signed)
Initial Nutrition Assessment  DOCUMENTATION CODES:      INTERVENTION:  Meals and snacks: Cater to pt preferences Medical Nutrition Supplement Therapy: Will add mightyshake TID   NUTRITION DIAGNOSIS:   Unintentional weight loss related to chronic illness as evidenced by per patient/family report.    GOAL:   Patient will meet greater than or equal to 90% of their needs    MONITOR:    (Energy intake, Anthropometrics)  REASON FOR ASSESSMENT:   Consult    ASSESSMENT:      Pt admitted with pneumonia, no MD note at this time  Past Medical History  Diagnosis Date  . Unspecified essential hypertension   . Kidney failure   . Personal history of tobacco use, presenting hazards to health   . Cataract   . Personal history of malignant neoplasm of breast   . Breast screening, unspecified   . Special screening for malignant neoplasms, colon   . Diabetes mellitus without complication ([place])   . MDRO (multiple drug resistant organisms) resistance   . H. pylori duodenitis   . Complication of anesthesia     BP DROPPED DURING LOBECTOMY IN 2014  . Cancer [facility]) 2001    left breast cancer s/p L/SN/R in 2001  . Cancer [facility]) 2014    right breast invasive CA, Er pos,Pr neg, Her 2 neg.  . Lung cancer, upper lobe ([place]) 2014    right upper lobe  . Breast cancer ([place])     Current Nutrition: nothing eaten this pm, just admitted  Food/Nutrition-Related History: pt reports good appetite eating 3 meals per day plus snacks and drinks 1 boost per day   Scheduled Medications:  . calcium citrate-vitamin D  1 tablet Oral BID  . enoxaparin (LOVENOX) injection  40 mg Subcutaneous Q24H  . gabapentin  300 mg Oral TID  . glipiZIDE  5 mg Oral Q breakfast  . hydrochlorothiazide  12.5 mg Oral Daily  . letrozole  2.5 mg Oral Daily  . pantoprazole  40 mg Oral Daily  . PARoxetine  10 mg Oral QHS  . piperacillin-tazobactam (ZOSYN)  IV  3.375 g Intravenous 3 times per day  . potassium chloride  SA  20 mEq Oral Daily  . simvastatin  10 mg Oral Daily  . vancomycin  1,000 mg Intravenous Once  . vancomycin  750 mg Intravenous Q12H  . venlafaxine  37.5 mg Oral Daily    Continuous Medications:  . sodium chloride 50 mL/hr at 07/28/2015 1251     Electrolyte/Renal Profile and Glucose Profile:   Recent Labs Lab 07/17/15 1018  NA 135  K 3.8  CL 101  CO2 27  BUN 19  CREATININE 0.89  CALCIUM 9.5  GLUCOSE 172*   Protein Profile:  Recent Labs Lab 07/17/15 1018  ALBUMIN 3.5    Gastrointestinal Profile: Last BM: 11/18   Nutrition-Focused Physical Exam Findings: Nutrition-Focused physical exam completed. Findings are WDL for fat depletion, muscle depletion, and edema.     Weight Change: Pt reports wt loss of 30 pounds in the last 4-6 months but per wt encounters stable wt for the last 4 months    Diet Order:  Diet regular Room service appropriate?: Yes; Fluid consistency:: Thin  Skin:   reviewed   Height:   Ht Readings from Last 1 Encounters:  04/08/15 '5\' 2"'$  (1.575 m)    Weight:   Wt Readings from Last 1 Encounters:  07/21/2015 144 lb 13.5 oz (65.7 kg)    Ideal Body Weight:  BMI:  There is no weight on file to calculate BMI.   EDUCATION NEEDS:   No education needs identified at this time  LOW Care Level  Tomislav Micale B. Zenia Resides, [place], [place] (pager)

## 2015-07-19 NOTE — Plan of Care (Signed)
Problem: Respiratory: Goal: Respiratory status will improve Outcome: Progressing Patient dyspneic on exertion. On 2L [place], O2 sats in 90s. SOB with exertion. Nonproductive cough. Goal: Ability to maintain a clear airway will improve Outcome: Progressing Patient coughing and deep breathing. Sitting up in the chair to help with breathing. Goal: Pain level will decrease Outcome: Progressing No c/o pain, only acutely with coughing, but tolerable.

## 2015-07-19 NOTE — H&P (Signed)
Brittney Lam is an 54 y.o. female.   Chief Complaint: Dry hacking cough, occasional green sputum production, increasing shortness of breath. CT scan from November 17 with pulmonary infiltrates.  HPI:  Patient was seen today in the clinic as an acute add on a following evaluation on November 16 by Dr. Oliva Bustard. Patient has history of breast cancer from 2005, as well as carcinoma of lung status post right upper lobe lobectomy, patient was also diagnosed with a left upper lobe mass in spring of 2016, thought to be second lung primary. Patient has finished concurrent radiation therapy as well as chemotherapy. She reports having increasing cough with occasional green sputum, shortness of breath, and extreme weakness and fatigue. She is currently on 2 L of O2 at home. Patient had a CT scan of the chest yesterday to rule out PE and noted to have consolidation in the left upper lobe and superior segment of the right lower lobe that has worsened as well as new very small pleural effusions.   Past Medical History  Diagnosis Date  . Unspecified essential hypertension   . Kidney failure   . Personal history of tobacco use, presenting hazards to health   . Cataract   . Personal history of malignant neoplasm of breast   . Breast screening, unspecified   . Special screening for malignant neoplasms, colon   . Diabetes mellitus without complication ([place])   . MDRO (multiple drug resistant organisms) resistance   . H. pylori duodenitis   . Complication of anesthesia     BP DROPPED DURING LOBECTOMY IN 2014  . Cancer [facility]) 2001    left breast cancer s/p L/SN/R in 2001  . Cancer [facility]) 2014    right breast invasive CA, Er pos,Pr neg, Her 2 neg.  . Lung cancer, upper lobe ([place]) 2014    right upper lobe  . Breast cancer [facility])     Past Surgical History  Procedure Laterality Date  . Intercostal nerve block  2005  . Cataract extraction w/ intraocular lens implant  2005  . Carpal tunnel release Right 2011  .  Insertion central venous access device w/ subcutaneous port  12-07-12  . Breast lumpectomy Left 2001  . Breast surgery Right 2014    total mastectomy  . Right lung upper lobectomy  03/2013  . Lobectomy Right   . Cataract extraction    . Mastectomy Right   . Colonoscopy    . Endobronchial ultrasound N/A 02/05/2015    Procedure: ENDOBRONCHIAL ULTRASOUND;  Surgeon: Flora Lipps, MD;  Location: [facility] ORS;  Service: Cardiopulmonary;  Laterality: N/A;    Family History  Problem Relation Age of Onset  . Diabetes Other   . Hyperlipidemia Other   . Hypertension Other    Social History:  reports that she quit smoking about 2 years ago. Her smoking use included Cigarettes. She has a 15 pack-year smoking history. She has never used smokeless tobacco. She reports that she does not drink alcohol or use illicit drugs.  Allergies:  Allergies  Allergen Reactions  . Metformin Diarrhea and Nausea Only  . Other Rash    Sage Wipes     (Not in a hospital admission)  No results found for this or any previous visit (from the past 48 hour(s)). Ct Angio Chest Pe W/cm &/or Wo Cm  07/18/2015  CLINICAL DATA:  Short of breath for several weeks EXAM: CT ANGIOGRAPHY CHEST WITH CONTRAST TECHNIQUE: Multidetector CT imaging of the chest was performed using the standard  protocol during bolus administration of intravenous contrast. Multiplanar CT image reconstructions and MIPs were obtained to evaluate the vascular anatomy. CONTRAST:  11m OMNIPAQUE IOHEXOL 350 MG/ML SOLN COMPARISON:  PET-CT 06/14/2015 FINDINGS: There are no filling defects in the pulmonary arterial tree to suggest acute pulmonary thromboembolism. Mediastinal adenopathy is not significantly changed since the PET-CT from 1 month ago. Left subclavian Port-A-Cath is stable. Consolidation within the apical left upper lobe and superior segment of the right lower lobe has markedly worsened. Very small bilateral pleural effusions have developed. No pneumothorax.  The 1.4 cm left upper lobe lung mass on image 43 of series 8 is stable. Postoperative changes from right mastectomy are again noted. Bilateral rib fractures again noted. Right rib fractures are noticeably displaced. There are sclerotic areas in upper left antral lateral ribs without fractures which may represent rib lesions. There are also fractures and left mid to lower anterior ribs with some evidence of healing. These are all not significantly changed. Review of the MIP images confirms the above findings. IMPRESSION: No evidence of acute pulmonary thromboembolism Consolidation in the left upper lobe and superior segment of the right lower lobe has worsened. This may represent pneumonia, pneumonitis, or infiltration with tumor. New very small pleural effusions. Stable mediastinal adenopathy. Bilateral of rib fractures are again noted and are not significantly changed. They may represent pathologic fractures. Electronically Signed   By: AMarybelle KillingsM.D.   On: 07/18/2015 11:02    Review of Systems  Constitutional: Positive for malaise/fatigue. Negative for fever, chills, weight loss and diaphoresis.  HENT: Positive for congestion. Negative for nosebleeds and sore throat.   Eyes: Negative for blurred vision, double vision, photophobia, pain, discharge and redness.  Respiratory: Positive for cough, sputum production, shortness of breath, wheezing and stridor. Negative for hemoptysis.   Cardiovascular: Positive for chest pain. Negative for palpitations, orthopnea, claudication, leg swelling and PND.  Gastrointestinal: Negative for heartburn, nausea, vomiting, abdominal pain, diarrhea, constipation, blood in stool and melena.  Genitourinary: Negative.   Musculoskeletal: Negative.   Skin: Negative.   Neurological: Positive for weakness. Negative for dizziness, tingling, focal weakness, seizures and headaches.  Endo/Heme/Allergies: Does not bruise/bleed easily.  Psychiatric/Behavioral: Negative for  depression. The patient is not nervous/anxious and does not have insomnia.     Blood pressure 109/68, pulse 84, temperature 97.1 F (36.2 C), temperature source Tympanic, resp. rate 18, weight 144 lb 13.5 oz (65.7 kg), SpO2 98 %. Physical Exam  Constitutional: She is oriented to person, place, and time. She appears well-developed and well-nourished.  HENT:  Head: Normocephalic.  Mouth/Throat: Oropharynx is clear and moist.  Eyes: Conjunctivae and EOM are normal.  Neck: Normal range of motion.  Cardiovascular: Normal rate, regular rhythm and normal heart sounds.   Respiratory: She is in respiratory distress. She has wheezes. She has rales. She exhibits tenderness.  GI: Soft. Bowel sounds are normal.  Musculoskeletal: Normal range of motion.  Neurological: She is alert and oriented to person, place, and time.  Skin: Skin is warm and dry.  Psychiatric: She has a normal mood and affect. Her behavior is normal.     Assessment/Plan Pneumonia, left upper lobe. Patient also noted with the left upper lobe carcinoma of lung. Most recent CT scan on November 17 shows consolidation in left upper lobe and superior segment of right lower lobe of lung. Possibility of tumor infiltrate, pneumonia, as well as pneumonitis.  *Patient with a WBC count of 18.9-we'll therefore start vancomycin as well as Zosyn for treatment  of community-acquired pneumonia. Will also start on high-dose Solu-Medrol. Sputum culture pending. *Pulmonary consult has also been entered. Would like to evaluate if EBUS or bronchoscopy should be performed. *Continuous oxygen therapy has been ordered. *Pharmacy consult entered for vancomycin and Zosyn dosing.  [place] Govern Herring 07/28/2015, 11:13 AM

## 2015-07-19 NOTE — Progress Notes (Signed)
Pt admitted to room 127. Report given to Albert Lea, Therapist, sports. Pt transferred to room 127 via wheelchair with 2L O2 in place.

## 2015-07-19 NOTE — Progress Notes (Signed)
Brittney Lam is an 54 y.o. female.   Chief Complaint: Dry hacking cough, occasional green sputum production, increasing shortness of breath. CT scan from November 17 with pulmonary infiltrates.  HPI:  Patient was seen today in the clinic as an acute add on a following evaluation on November 16 by Dr. Oliva Bustard. Patient has history of breast cancer from 2005, as well as carcinoma of lung status post right upper lobe lobectomy, patient was also diagnosed with a left upper lobe mass in spring of 2016, thought to be second lung primary. Patient has finished concurrent radiation therapy as well as chemotherapy. She reports having increasing cough with occasional green sputum, shortness of breath, and extreme weakness and fatigue. She is currently on 2 L of O2 at home. Patient had a CT scan of the chest yesterday to rule out PE and noted to have consolidation in the left upper lobe and superior segment of the right lower lobe that has worsened as well as new very small pleural effusions.   Past Medical History  Diagnosis Date  . Unspecified essential hypertension   . Kidney failure   . Personal history of tobacco use, presenting hazards to health   . Cataract   . Personal history of malignant neoplasm of breast   . Breast screening, unspecified   . Special screening for malignant neoplasms, colon   . Diabetes mellitus without complication ([place])   . MDRO (multiple drug resistant organisms) resistance   . H. pylori duodenitis   . Complication of anesthesia     BP DROPPED DURING LOBECTOMY IN 2014  . Cancer [facility]) 2001    left breast cancer s/p L/SN/R in 2001  . Cancer [facility]) 2014    right breast invasive CA, Er pos,Pr neg, Her 2 neg.  . Lung cancer, upper lobe ([place]) 2014    right upper lobe  . Breast cancer [facility])     Past Surgical History  Procedure Laterality Date  . Intercostal nerve block  2005  . Cataract extraction w/ intraocular lens implant  2005  . Carpal tunnel release Right 2011  .  Insertion central venous access device w/ subcutaneous port  12-07-12  . Breast lumpectomy Left 2001  . Breast surgery Right 2014    total mastectomy  . Right lung upper lobectomy  03/2013  . Lobectomy Right   . Cataract extraction    . Mastectomy Right   . Colonoscopy    . Endobronchial ultrasound N/A 02/05/2015    Procedure: ENDOBRONCHIAL ULTRASOUND;  Surgeon: Flora Lipps, MD;  Location: [facility] ORS;  Service: Cardiopulmonary;  Laterality: N/A;    Family History  Problem Relation Age of Onset  . Diabetes Other   . Hyperlipidemia Other   . Hypertension Other    Social History:  reports that she quit smoking about 2 years ago. Her smoking use included Cigarettes. She has a 15 pack-year smoking history. She has never used smokeless tobacco. She reports that she does not drink alcohol or use illicit drugs.  Allergies:  Allergies  Allergen Reactions  . Metformin Diarrhea and Nausea Only  . Other Rash    Sage Wipes     (Not in a hospital admission)  No results found for this or any previous visit (from the past 48 hour(s)). Ct Angio Chest Pe W/cm &/or Wo Cm  07/18/2015  CLINICAL DATA:  Short of breath for several weeks EXAM: CT ANGIOGRAPHY CHEST WITH CONTRAST TECHNIQUE: Multidetector CT imaging of the chest was performed using the standard  protocol during bolus administration of intravenous contrast. Multiplanar CT image reconstructions and MIPs were obtained to evaluate the vascular anatomy. CONTRAST:  189m OMNIPAQUE IOHEXOL 350 MG/ML SOLN COMPARISON:  PET-CT 06/14/2015 FINDINGS: There are no filling defects in the pulmonary arterial tree to suggest acute pulmonary thromboembolism. Mediastinal adenopathy is not significantly changed since the PET-CT from 1 month ago. Left subclavian Port-A-Cath is stable. Consolidation within the apical left upper lobe and superior segment of the right lower lobe has markedly worsened. Very small bilateral pleural effusions have developed. No pneumothorax.  The 1.4 cm left upper lobe lung mass on image 43 of series 8 is stable. Postoperative changes from right mastectomy are again noted. Bilateral rib fractures again noted. Right rib fractures are noticeably displaced. There are sclerotic areas in upper left antral lateral ribs without fractures which may represent rib lesions. There are also fractures and left mid to lower anterior ribs with some evidence of healing. These are all not significantly changed. Review of the MIP images confirms the above findings. IMPRESSION: No evidence of acute pulmonary thromboembolism Consolidation in the left upper lobe and superior segment of the right lower lobe has worsened. This may represent pneumonia, pneumonitis, or infiltration with tumor. New very small pleural effusions. Stable mediastinal adenopathy. Bilateral of rib fractures are again noted and are not significantly changed. They may represent pathologic fractures. Electronically Signed   By: AMarybelle KillingsM.D.   On: 07/18/2015 11:02    Review of Systems  Constitutional: Positive for malaise/fatigue. Negative for fever, chills, weight loss and diaphoresis.  HENT: Positive for congestion. Negative for nosebleeds and sore throat.   Eyes: Negative for blurred vision, double vision, photophobia, pain, discharge and redness.  Respiratory: Positive for cough, sputum production, shortness of breath, wheezing and stridor. Negative for hemoptysis.   Cardiovascular: Positive for chest pain. Negative for palpitations, orthopnea, claudication, leg swelling and PND.  Gastrointestinal: Negative for heartburn, nausea, vomiting, abdominal pain, diarrhea, constipation, blood in stool and melena.  Genitourinary: Negative.   Musculoskeletal: Negative.   Skin: Negative.   Neurological: Positive for weakness. Negative for dizziness, tingling, focal weakness, seizures and headaches.  Endo/Heme/Allergies: Does not bruise/bleed easily.  Psychiatric/Behavioral: Negative for  depression. The patient is not nervous/anxious and does not have insomnia.     Blood pressure 109/68, pulse 84, temperature 97.1 F (36.2 C), temperature source Tympanic, resp. rate 18, weight 144 lb 13.5 oz (65.7 kg), SpO2 98 %. Physical Exam  Constitutional: She is oriented to person, place, and time. She appears well-developed and well-nourished.  HENT:  Head: Normocephalic.  Mouth/Throat: Oropharynx is clear and moist.  Eyes: Conjunctivae and EOM are normal.  Neck: Normal range of motion.  Cardiovascular: Normal rate, regular rhythm and normal heart sounds.   Respiratory: She is in respiratory distress. She has wheezes. She has rales. She exhibits tenderness.  GI: Soft. Bowel sounds are normal.  Musculoskeletal: Normal range of motion.  Neurological: She is alert and oriented to person, place, and time.  Skin: Skin is warm and dry.  Psychiatric: She has a normal mood and affect. Her behavior is normal.     Assessment/Plan Pneumonia, left upper lobe. Patient also noted with the left upper lobe carcinoma of lung. Most recent CT scan on November 17 shows consolidation in left upper lobe and superior segment of right lower lobe of lung. Possibility of tumor infiltrate, pneumonia, as well as pneumonitis.  *Patient with a WBC count of 18.9-we'll therefore start vancomycin as well as Zosyn for treatment  of community-acquired pneumonia. Will also start on high-dose Solu-Medrol. Sputum culture pending. *Pulmonary consult has also been entered. Would like to evaluate if EBUS or bronchoscopy should be performed. *Continuous oxygen therapy has been ordered. *Pharmacy consult entered for vancomycin and Zosyn dosing.  Dr. Rogue Bussing was consulted to admit patient into hospital under Oncology services.  Orville Govern Herring 07/10/2015, 11:13 AM

## 2015-07-19 NOTE — Progress Notes (Signed)
Notified MD of right mastectomy (from 2014) and left upper arm DVT. Asked which extremity we can use to take BP. Per MD okay to use left forearm or bilateral lower extremities. Will put up sign in room and notify staff.

## 2015-07-19 NOTE — Progress Notes (Addendum)
ANTIBIOTIC CONSULT NOTE - INITIAL  Pharmacy Consult for Zosyn/Vancomycin Dosing (Day 1) Indication: rule out pneumonia  Allergies  Allergen Reactions  . Metformin Diarrhea and Nausea Only  . Other Rash    Sage Wipes    Patient Measurements:   Adjusted Body Weight: 56 kg  Vital Signs: Temp: 98.3 F (36.8 C) (11/18 1142) Temp Source: Oral (11/18 1142) BP: 124/71 mmHg (11/18 1142) Pulse Rate: 80 (11/18 1142) Intake/Output from previous day:   Intake/Output from this shift:    Labs:  Recent Labs  07/17/15 1018  WBC 18.9*  HGB 12.6  PLT 261  CREATININE 0.89   Estimated Creatinine Clearance: 63.5 mL/min (by C-G formula based on Cr of 0.89). No results for input(s): VANCOTROUGH, VANCOPEAK, VANCORANDOM, GENTTROUGH, GENTPEAK, GENTRANDOM, TOBRATROUGH, TOBRAPEAK, TOBRARND, AMIKACINPEAK, AMIKACINTROU, AMIKACIN in the last 72 hours.   Microbiology: No results found for this or any previous visit (from the past 720 hour(s)).  Medical History: Past Medical History  Diagnosis Date  . Unspecified essential hypertension   . Kidney failure   . Personal history of tobacco use, presenting hazards to health   . Cataract   . Personal history of malignant neoplasm of breast   . Breast screening, unspecified   . Special screening for malignant neoplasms, colon   . Diabetes mellitus without complication ([place])   . MDRO (multiple drug resistant organisms) resistance   . H. pylori duodenitis   . Complication of anesthesia     BP DROPPED DURING LOBECTOMY IN 2014  . Cancer [facility]) 2001    left breast cancer s/p L/SN/R in 2001  . Cancer [facility]) 2014    right breast invasive CA, Er pos,Pr neg, Her 2 neg.  . Lung cancer, upper lobe ([place]) 2014    right upper lobe  . Breast cancer ([facility])     Medications:  Scheduled:  . enoxaparin (LOVENOX) injection  40 mg Subcutaneous Q24H  . piperacillin-tazobactam (ZOSYN)  IV  3.375 g Intravenous 3 times per day  . vancomycin  1,000 mg Intravenous  Once  . vancomycin  750 mg Intravenous Q12H   Infusions:  . sodium chloride     Assessment: Pharmacy consulted to dose vancomycin and Zosyn for 54 yo female being treated for left upper lobe PNA.   Goal of Therapy:  Vancomycin trough level 15-20 mcg/ml  Plan:  Will order vancomycin 1g IV X1. Will continue patient on vancomycin '750mg'$  IV Q12hr for goal trough of 15-20. Will scheduled first dose of vancomycin '750mg'$  to be given 6 hours after initial dose. Will obtain trough prior to am dose on 11/20.    Will start patient on Zosyn EI 3.375g IV Q8hr.    Pharmacy will continue to monitor and adjust per consult.    Arta Stump L 07/11/2015,11:47 AM

## 2015-07-19 NOTE — Telephone Encounter (Signed)
Per Barnabas Lister, may call in prescriptions under [facility] to [facility]. Pt notified.

## 2015-07-19 NOTE — Progress Notes (Addendum)
Notified Dr. B of left upper arm DVT & updated med list. MD to put in orders.

## 2015-07-20 LAB — GLUCOSE, CAPILLARY
GLUCOSE-CAPILLARY: 454 mg/dL — AB (ref 65–99)
GLUCOSE-CAPILLARY: 376 mg/dL — AB (ref 65–99)
GLUCOSE-CAPILLARY: 269 mg/dL — AB (ref 65–99)
Glucose-Capillary: 376 mg/dL — ABNORMAL HIGH (ref 65–99)
Glucose-Capillary: 429 mg/dL — ABNORMAL HIGH (ref 65–99)
Glucose-Capillary: 468 mg/dL — ABNORMAL HIGH (ref 65–99)

## 2015-07-20 LAB — COMPREHENSIVE METABOLIC PANEL
ALBUMIN: 2.7 g/dL — AB (ref 3.5–5.0)
ALK PHOS: 79 U/L (ref 38–126)
ALT: 17 U/L (ref 14–54)
AST: 16 U/L (ref 15–41)
Anion gap: 5 (ref 5–15)
BILIRUBIN TOTAL: 0.4 mg/dL (ref 0.3–1.2)
BUN: 12 mg/dL (ref 6–20)
CO2: 30 mmol/L (ref 22–32)
CREATININE: 0.75 mg/dL (ref 0.44–1.00)
Calcium: 8.8 mg/dL — ABNORMAL LOW (ref 8.9–10.3)
Chloride: 103 mmol/L (ref 101–111)
GFR calc Af Amer: >60 mL/min (ref >60)
GFR calc non Af Amer: >60 mL/min (ref >60)
GLUCOSE: 132 mg/dL — AB (ref 65–99)
POTASSIUM: 3.9 mmol/L (ref 3.5–5.1)
Sodium: 138 mmol/L (ref 135–145)
TOTAL PROTEIN: 6.2 g/dL — AB (ref 6.5–8.1)

## 2015-07-20 LAB — CBC
HEMATOCRIT: 33.3 % — AB (ref 35.0–47.0)
HEMOGLOBIN: 10.8 g/dL — AB (ref 12.0–16.0)
MCH: 30.8 pg (ref 26.0–34.0)
MCHC: 32.6 g/dL (ref 32.0–36.0)
MCV: 94.6 fL (ref 80.0–100.0)
Platelets: 217 10*3/uL (ref 150–440)
RBC: 3.52 MIL/uL — AB (ref 3.80–5.20)
RDW: 15.5 % — AB (ref 11.5–14.5)
WBC: 14.2 10*3/uL — AB (ref 3.6–11.0)

## 2015-07-20 LAB — EXPECTORATED SPUTUM ASSESSMENT W GRAM STAIN, RFLX TO RESP C

## 2015-07-20 MED ORDER — INSULIN ASPART 100 UNIT/ML ~~LOC~~ SOLN
0.0000 [IU] | Freq: Three times a day (TID) | SUBCUTANEOUS | Status: DC
  Administered 2015-07-21 – 2015-07-22 (×4): 3 [IU] via SUBCUTANEOUS
  Administered 2015-07-23: 2 [IU] via SUBCUTANEOUS
  Administered 2015-07-24: 12:00:00 3 [IU] via SUBCUTANEOUS
  Administered 2015-07-24: 17:00:00 11 [IU] via SUBCUTANEOUS
  Administered 2015-07-25: 5 [IU] via SUBCUTANEOUS
  Administered 2015-07-25 – 2015-07-27 (×2): 2 [IU] via SUBCUTANEOUS
  Administered 2015-07-27: 3 [IU] via SUBCUTANEOUS
  Administered 2015-07-28: 5 [IU] via SUBCUTANEOUS
  Administered 2015-07-28: 2 [IU] via SUBCUTANEOUS
  Administered 2015-07-29: 5 [IU] via SUBCUTANEOUS
  Administered 2015-07-29: 2 [IU] via SUBCUTANEOUS
  Administered 2015-07-30 (×2): 3 [IU] via SUBCUTANEOUS
  Administered 2015-07-31: 5 [IU] via SUBCUTANEOUS
  Administered 2015-07-31: 3 [IU] via SUBCUTANEOUS
  Administered 2015-08-01 (×2): 5 [IU] via SUBCUTANEOUS
  Administered 2015-08-02 (×2): 2 [IU] via SUBCUTANEOUS
  Administered 2015-08-03: 5 [IU] via SUBCUTANEOUS
  Administered 2015-08-04: 3 [IU] via SUBCUTANEOUS
  Administered 2015-08-04 (×2): 2 [IU] via SUBCUTANEOUS
  Administered 2015-08-05: 3 [IU] via SUBCUTANEOUS
  Filled 2015-07-20: qty 3
  Filled 2015-07-20: qty 2
  Filled 2015-07-20: qty 3
  Filled 2015-07-20 (×2): qty 5
  Filled 2015-07-20 (×2): qty 2
  Filled 2015-07-20: qty 3
  Filled 2015-07-20: qty 5
  Filled 2015-07-20: qty 11
  Filled 2015-07-20 (×2): qty 3
  Filled 2015-07-20: qty 2
  Filled 2015-07-20 (×3): qty 3
  Filled 2015-07-20: qty 5
  Filled 2015-07-20: qty 2
  Filled 2015-07-20 (×2): qty 5
  Filled 2015-07-20: qty 3
  Filled 2015-07-20 (×2): qty 2
  Filled 2015-07-20 (×2): qty 3
  Filled 2015-07-20: qty 5
  Filled 2015-07-20: qty 3
  Filled 2015-07-20: qty 1
  Filled 2015-07-20: qty 3

## 2015-07-20 MED ORDER — INSULIN ASPART 100 UNIT/ML ~~LOC~~ SOLN
0.0000 [IU] | Freq: Every day | SUBCUTANEOUS | Status: DC
  Administered 2015-07-20: 5 [IU] via SUBCUTANEOUS
  Administered 2015-07-22: 22:00:00 3 [IU] via SUBCUTANEOUS
  Administered 2015-07-23 – 2015-07-28 (×5): 2 [IU] via SUBCUTANEOUS
  Administered 2015-08-03: 3 [IU] via SUBCUTANEOUS
  Filled 2015-07-20: qty 2
  Filled 2015-07-20: qty 3
  Filled 2015-07-20 (×3): qty 2
  Filled 2015-07-20: qty 3
  Filled 2015-07-20 (×2): qty 2
  Filled 2015-07-20: qty 5
  Filled 2015-07-20: qty 2

## 2015-07-20 MED ORDER — ONDANSETRON HCL 4 MG/2ML IJ SOLN: 4.0000 mg | Freq: Four times a day (QID) | INTRAMUSCULAR | Status: DC

## 2015-07-20 MED ORDER — ONDANSETRON HCL 4 MG/2ML IJ SOLN
4.0000 mg | Freq: Four times a day (QID) | INTRAMUSCULAR | Status: DC | PRN
  Administered 2015-07-20: 01:00:00 4 mg via INTRAVENOUS
  Filled 2015-07-20: qty 2

## 2015-07-20 MED ORDER — INSULIN ASPART 100 UNIT/ML ~~LOC~~ SOLN
10.0000 [IU] | Freq: Once | SUBCUTANEOUS | Status: AC
  Administered 2015-07-20: 10 [IU] via SUBCUTANEOUS
  Filled 2015-07-20: qty 10

## 2015-07-20 MED ORDER — METHYLPREDNISOLONE SODIUM SUCC 125 MG IJ SOLR
60.0000 mg | Freq: Two times a day (BID) | INTRAMUSCULAR | Status: DC
  Administered 2015-07-20: 11:00:00 60 mg via INTRAVENOUS
  Filled 2015-07-20: qty 2

## 2015-07-20 MED ORDER — DIPHENHYDRAMINE HCL 25 MG PO CAPS
25.0000 mg | ORAL_CAPSULE | Freq: Once | ORAL | Status: AC
  Administered 2015-07-20: 25 mg via ORAL
  Filled 2015-07-20: qty 1

## 2015-07-20 MED ORDER — IPRATROPIUM-ALBUTEROL 0.5-2.5 (3) MG/3ML IN SOLN
3.0000 mL | Freq: Four times a day (QID) | RESPIRATORY_TRACT | Status: DC
  Administered 2015-07-20 – 2015-07-27 (×27): 3 mL via RESPIRATORY_TRACT
  Filled 2015-07-20 (×29): qty 3

## 2015-07-20 MED ORDER — INSULIN ASPART 100 UNIT/ML ~~LOC~~ SOLN
5.0000 [IU] | Freq: Once | SUBCUTANEOUS | Status: AC
  Administered 2015-07-20: 5 [IU] via SUBCUTANEOUS
  Filled 2015-07-20: qty 5

## 2015-07-20 NOTE — Plan of Care (Signed)
Problem: Education: Goal: Knowledge of Brittney Lam General Education information/materials will improve Outcome: Progressing Education provided concerning fall risk, diaganosis, and LUE DVT. Pt verbalized understanding of call light, reason for being admitted and verbalized understanding of the use of Lovenox.   Problem: Respiratory: Goal: Respiratory status will improve Outcome: Progressing No c/o shortness of breath, 96-99% on 2 LPM.  Goal: Ability to maintain a clear airway will improve Outcome: Progressing Pt c/o of rib cage pain with cough, abd binder ordered to improve pain, prn pain and cough meds given with some improvement noted. Pt states pain is tolerable.     Goal: Pain level will decrease Outcome: Progressing Pt c/o of rib cage pain with cough, abd binder ordered to improve pain, prn pain and cough meds given with some improvement noted. Pt states pain is tolerable.  Goal: Identification of resources available to assist in meeting health care needs will improve Outcome: Progressing Pt instructed to call if any questions or concerns.

## 2015-07-20 NOTE — Plan of Care (Addendum)
Problem: Respiratory: Goal: Respiratory status will improve Outcome: Progressing Patient remains on 2L [place]. Productive cough with pink tinged sputum. MD aware. Sputum sample sent. SOB with exertion. Pt coarse/expiratory wheezy lungs. Notified MD. Solumedrol and duonebs ordered. Goal: Ability to maintain a clear airway will improve Outcome: Progressing Pt coughing and deep breathing well and sitting up in chair. Goal: Pain level will decrease Outcome: Progressing Pain with coughing. Declines pain medicine at this time. Encouraged patient to split with pillow even with abdominal binder. Goal: Identification of resources available to assist in meeting health care needs will improve Outcome: Progressing Respiratory therapy consulted with duonebs.     Pts blood sugar over 400. MD aware. 10 units novolog given 1x. Ordered to discontinue solumedrol as well.

## 2015-07-20 NOTE — Progress Notes (Signed)
Notified MD of pts elevated BS. Ordered to recheck in 1 hour from administration, per Dr. Rogue Bussing.

## 2015-07-20 NOTE — Progress Notes (Signed)
Brittney Lam   DOB:Aug 27, 1961   WE#:[id number]   CC: Shortness of breath cough/pneumonia  Subjective:  Patient still complains of shortness of breath or productive cough. Noted to have pinkish tinged sputum no frank hemoptysis.  Of note upper extremity venous Doppler showed-focal occlusive DVT of the upper extremity.  ROS: No nausea no vomiting or diarrhea.  Objective:  Filed Vitals:   07/20/15 0502  BP: 109/55  Pulse: 53  Temp: 97.5 F (36.4 C)  Resp: 18     Intake/Output Summary (Last 24 hours) at 07/20/15 1059 Last data filed at 07/20/15 0900  Gross per 24 hour  Intake 1539.17 ml  Output      3 ml  Net 1536.17 ml    GENERAL:alert, no distress; having coughing spells. She is on oxygen. She is alone. SKIN: No rash. EYES: normal, Conjunctiva are pink and non-injected, sclera clear OROPHARYNX:no exudate, no erythema and lips, buccal mucosa, and tongue normal  NECK: supple, thyroid normal size, non-tender, without nodularity LYMPH:  no palpable lymphadenopathy in the cervical, axillary or inguinal LUNGS: Bilateral decreased air entry. Bilateral scattered wheezing noted. Coarse breath sounds. HEART: regular rate & rhythm and no murmurs and no lower extremity edema ABDOMEN:abdomen soft, non-tender and normal bowel sounds Musculoskeletal:no cyanosis of digits and no clubbing; left upper extremity swelling compared to the right. NEURO: alert & oriented x 3 with fluent speech, no focal motor/sensory deficits   Labs:  Lab Results  Component Value Date   WBC 14.2* 07/20/2015   HGB 10.8* 07/20/2015   HCT 33.3* 07/20/2015   MCV 94.6 07/20/2015   PLT 217 07/20/2015   NEUTROABS 19.5* 07/08/2015    Lab Results  Component Value Date   NA 138 07/20/2015   K 3.9 07/20/2015   CL 103 07/20/2015   CO2 30 07/20/2015    Studies:  US Venous Img Upper Uni Left  07/05/2015  CLINICAL DATA:  Left upper extremity swelling x1 month. Diabetes. Left subclavian surgically placed port  catheter. EXAM: LEFT UPPER EXTREMITY VENOUS DOPPLER ULTRASOUND TECHNIQUE: Gray-scale sonography with graded compression, as well as color Doppler and duplex ultrasound were performed to evaluate the upper extremity deep venous system from the level of the subclavian vein and including the jugular, axillary, basilic and upper cephalic vein. Spectral Doppler was utilized to evaluate flow at rest and with distal augmentation maneuvers. COMPARISON:  CT 07/18/2015 and previous FINDINGS: Thrombus within deep veins: Noncompressible thrombus in the axillary vein over short segment. Brachial, radial, ulnar, basilic, and cephalic veins remain patent and compressible, with somewhat monophasic waveforms probably related to the central obstruction. There is a monophasic antegrade waveform in the visualized subclavian vein, without evidence of transmitted right atrial pulsations. Other findings: Images of contralateral subclavian vein are unremarkable. IMPRESSION: 1. Isolated occlusive left axillary  DVT. Electronically Signed   By: Lucrezia Europe M.D.   On: 07/07/2015 16:44    Assessment & Plan:  54 year old female patient with a history of locally advanced left upper lobe lung cancer status post concurrent chemoradiation therapy currently admitted to the hospital for worsening shortness of breath/cough  # Bilateral-left upper lobe and right lower lobe consolidation/pneumonia- no significant improvement noted overnight  # Isolated left axillary DVT-new-on Lovenox  # Locally advanced left upper lobe lung cancer/Stage III status post chemoradiation [finished finished Aug 10th, 2016]- clinically less likely progression of cancer   #History of breast cancer  Recommendations:  # Continue IV vancomycin and Zosyn.  # Add in respiratory treatments/steroids.  #  If no clinical improvement- recommend pulmonary evaluation/ID evaluation.  Above plan of care was discussed with the patient's nurse/patient in  detail.    Cammie Sickle, MD 07/20/2015  10:59 AM

## 2015-07-21 LAB — BASIC METABOLIC PANEL
Anion gap: 6 (ref 5–15)
BUN: 15 mg/dL (ref 6–20)
CO2: 27 mmol/L (ref 22–32)
CREATININE: 0.85 mg/dL (ref 0.44–1.00)
Calcium: 9.3 mg/dL (ref 8.9–10.3)
Chloride: 106 mmol/L (ref 101–111)
GFR calc Af Amer: >60 mL/min (ref >60)
Glucose, Bld: 151 mg/dL — ABNORMAL HIGH (ref 65–99)
Potassium: 3.5 mmol/L (ref 3.5–5.1)
SODIUM: 139 mmol/L (ref 135–145)

## 2015-07-21 LAB — CBC WITH DIFFERENTIAL/PLATELET
Basophils Absolute: 0.1 10*3/uL (ref 0–0.1)
Basophils Relative: 1 %
EOS ABS: 0 10*3/uL (ref 0–0.7)
EOS PCT: 0 %
HCT: 33.5 % — ABNORMAL LOW (ref 35.0–47.0)
Hemoglobin: 11.1 g/dL — ABNORMAL LOW (ref 12.0–16.0)
Lymphocytes Relative: 4 %
Lymphs Abs: 0.7 10*3/uL — ABNORMAL LOW (ref 1.0–3.6)
MCH: 31.2 pg (ref 26.0–34.0)
MCHC: 33 g/dL (ref 32.0–36.0)
MCV: 94.8 fL (ref 80.0–100.0)
MONO ABS: 1 10*3/uL — AB (ref 0.2–0.9)
Monocytes Relative: 6 %
Neutro Abs: 14.1 10*3/uL — ABNORMAL HIGH (ref 1.4–6.5)
Neutrophils Relative %: 89 %
PLATELETS: 215 10*3/uL (ref 150–440)
RBC: 3.54 MIL/uL — ABNORMAL LOW (ref 3.80–5.20)
RDW: 15.8 % — AB (ref 11.5–14.5)
WBC: 15.8 10*3/uL — AB (ref 3.6–11.0)

## 2015-07-21 LAB — GLUCOSE, CAPILLARY
GLUCOSE-CAPILLARY: 109 mg/dL — AB (ref 65–99)
GLUCOSE-CAPILLARY: 195 mg/dL — AB (ref 65–99)
GLUCOSE-CAPILLARY: 190 mg/dL — AB (ref 65–99)
Glucose-Capillary: 165 mg/dL — ABNORMAL HIGH (ref 65–99)

## 2015-07-21 LAB — VANCOMYCIN, TROUGH: VANCOMYCIN TR: 9 ug/mL — AB (ref 10–20)

## 2015-07-21 MED ORDER — VANCOMYCIN HCL IN DEXTROSE 1-5 GM/200ML-% IV SOLN
1000.0000 mg | Freq: Two times a day (BID) | INTRAVENOUS | Status: DC
  Administered 2015-07-21 – 2015-07-22 (×4): 1000 mg via INTRAVENOUS
  Filled 2015-07-21 (×5): qty 200

## 2015-07-21 NOTE — Progress Notes (Signed)
Spoke with Dr. B to make him aware of pt's increase HR (120-130s) when pt ambulates to the restroom HR is  150-160s.  Dr. B gave new order for stat EKG.  Clarise Cruz, RN

## 2015-07-21 NOTE — Progress Notes (Addendum)
ANTIBIOTIC CONSULT NOTE - INITIAL  Pharmacy Consult for Zosyn/Vancomycin Dosing (Day 3) Indication: rule out pneumonia  Allergies  Allergen Reactions  . Metformin Diarrhea and Nausea Only  . Other Rash    Sage Wipes    Patient Measurements: Height: '5\' 2"'$  (157.5 cm) Weight: 144 lb (65.318 kg) IBW/kg (Calculated) : 50.1 Adjusted Body Weight: 56 kg  Vital Signs: Temp: 98.2 F (36.8 C) (11/20 0529) Temp Source: Oral (11/20 0529) BP: 122/53 mmHg (11/20 0529) Pulse Rate: 74 (11/20 0529) Intake/Output from previous day: 11/19 0701 - 11/20 0700 In: 1130 [P.O.:480; I.V.:600; IV Piggyback:50] Out: -  Intake/Output from this shift: Total I/O In: 650 [I.V.:600; IV Piggyback:50] Out: -   Labs:  Recent Labs  07/06/2015 1253 07/20/15 0615 07/21/15 0251  WBC 20.5* 14.2* 15.8*  HGB 12.0 10.8* 11.1*  PLT 247 217 215  CREATININE 0.67 0.75 0.85   Estimated Creatinine Clearance: 66.3 mL/min (by C-G formula based on Cr of 0.85).  Recent Labs  07/21/15 0531  [place] 9*     Microbiology: Recent Results (from the past 720 hour(s))  Culture, sputum-assessment     Status: None   Collection Time: 07/20/15  7:33 AM  Result Value Ref Range Status   Specimen Description EXPECTORATED SPUTUM  Final   Special Requests NONE  Final   Sputum evaluation   Final    Sputum specimen not acceptable for testing.  Please recollect.   Results Called to: [facility] PARDINI AT 1017 ON 07/20/15 CTJ    Report Status 07/20/2015 FINAL  Final    Medical History: Past Medical History  Diagnosis Date  . Unspecified essential hypertension   . Kidney failure   . Personal history of tobacco use, presenting hazards to health   . Cataract   . Personal history of malignant neoplasm of breast   . Breast screening, unspecified   . Special screening for malignant neoplasms, colon   . Diabetes mellitus without complication ([place])   . MDRO (multiple drug resistant organisms) resistance   . H. pylori  duodenitis   . Complication of anesthesia     BP DROPPED DURING LOBECTOMY IN 2014  . Cancer [facility]) 2001    left breast cancer s/p L/SN/R in 2001  . Cancer [facility]) 2014    right breast invasive CA, Er pos,Pr neg, Her 2 neg.  . Lung cancer, upper lobe ([place]) 2014    right upper lobe  . Breast cancer ([facility])     Medications:  Scheduled:  . calcium citrate-vitamin D  1 tablet Oral BID  . enoxaparin (LOVENOX) injection  1.5 mg/kg Subcutaneous Q24H  . glipiZIDE  5 mg Oral Q breakfast  . hydrochlorothiazide  12.5 mg Oral Daily  . insulin aspart  0-15 Units Subcutaneous TID WC  . insulin aspart  0-5 Units Subcutaneous QHS  . ipratropium-albuterol  3 mL Nebulization Q6H  . letrozole  2.5 mg Oral Daily  . pantoprazole  40 mg Oral Daily  . PARoxetine  10 mg Oral QHS  . piperacillin-tazobactam (ZOSYN)  IV  3.375 g Intravenous 3 times per day  . potassium chloride SA  20 mEq Oral Daily  . simvastatin  10 mg Oral Daily  . vancomycin  1,000 mg Intravenous Q12H   Infusions:  . sodium chloride 50 mL/hr at 07/20/15 2345   Assessment: Pharmacy consulted to dose vancomycin and Zosyn for 54 yo female being treated for left upper lobe PNA.   Goal of Therapy:  Vancomycin trough level 15-20 mcg/ml  Plan:  Will order vancomycin 1g IV X1. Will continue patient on vancomycin '750mg'$  IV Q12hr for goal trough of 15-20. Will scheduled first dose of vancomycin '750mg'$  to be given 6 hours after initial dose. Will obtain trough prior to am dose on 11/20.    Will start patient on Zosyn EI 3.375g IV Q8hr.     11/20 AM vanc level 9. Changed to 1 gram q 12 hours. Recheck level before 4th new dose.   Pharmacy will continue to monitor and adjust per consult.    Hartwell Vandiver S 07/21/2015,6:19 AM

## 2015-07-21 NOTE — Plan of Care (Signed)
Problem: Respiratory: Goal: Pain level will decrease Outcome: Progressing No c/o pain, pt is resting comfortably in bed.  VSS, afebrile.

## 2015-07-21 NOTE — Progress Notes (Signed)
Spoke with Dr. B and made him aware of results of EKG, which was sinus tach but otherwise normal.  MD gave no new orders and is not concerned about pt's increased HR at this time.  Clarise Cruz, RN

## 2015-07-21 NOTE — Progress Notes (Signed)
Brittney Lam   DOB:[birth year]   ZO#:[id number]   CC: Shortness of breath cough/pneumonia  Subjective:  Patient still complains of shortness of breath or productive cough. Noted to have pinkish tinged sputum no frank hemoptysis. However this is improving. She feels her lungs are not as tight as there were at admission.  Complains of worsening swelling of the left upper extremity/denies any significant pain.  Had elevated blood sugars all along yesterday in the range of 400/because of steroids.  ROS: No nausea no vomiting or diarrhea.  Objective:  Filed Vitals:   07/21/15 0529  BP: 122/53  Pulse: 74  Temp: 98.2 F (36.8 C)  Resp: 18     Intake/Output Summary (Last 24 hours) at 07/21/15 0925 Last data filed at 07/21/15 0610  Gross per 24 hour  Intake    890 ml  Output      0 ml  Net    890 ml    GENERAL:alert, no distress; having coughing spells. She is on oxygen. She is alone. SKIN: No rash. EYES: normal, Conjunctiva are pink and non-injected, sclera clear OROPHARYNX:no exudate, no erythema and lips, buccal mucosa, and tongue normal  NECK: No abnormality noted LYMPH:  no palpable lymphadenopathy in the cervical, axillary or inguinal LUNGS: Bilateral decreased air entry. Bilateral scattered wheezing noted. Coarse breath sounds. HEART: regular rate & rhythm and no murmurs and no lower extremity edema ABDOMEN:abdomen soft, non-tender and normal bowel sounds Musculoskeletal:no cyanosis of digits and no clubbing; left upper extremity swelling compared to the right. Pulses intact NEURO: alert & oriented x 3 with fluent speech, no focal motor/sensory deficits   Labs:  Lab Results  Component Value Date   WBC 15.8* 07/21/2015   HGB 11.1* 07/21/2015   HCT 33.5* 07/21/2015   MCV 94.8 07/21/2015   PLT 215 07/21/2015   NEUTROABS 14.1* 07/21/2015    Lab Results  Component Value Date   NA 139 07/21/2015   K 3.5 07/21/2015   CL 106 07/21/2015   CO2 27 07/21/2015     Studies:  US Venous Img Upper Uni Left  07/22/2015  CLINICAL DATA:  Left upper extremity swelling x1 month. Diabetes. Left subclavian surgically placed port catheter. EXAM: LEFT UPPER EXTREMITY VENOUS DOPPLER ULTRASOUND TECHNIQUE: Gray-scale sonography with graded compression, as well as color Doppler and duplex ultrasound were performed to evaluate the upper extremity deep venous system from the level of the subclavian vein and including the jugular, axillary, basilic and upper cephalic vein. Spectral Doppler was utilized to evaluate flow at rest and with distal augmentation maneuvers. COMPARISON:  CT 07/18/2015 and previous FINDINGS: Thrombus within deep veins: Noncompressible thrombus in the axillary vein over short segment. Brachial, radial, ulnar, basilic, and cephalic veins remain patent and compressible, with somewhat monophasic waveforms probably related to the central obstruction. There is a monophasic antegrade waveform in the visualized subclavian vein, without evidence of transmitted right atrial pulsations. Other findings: Images of contralateral subclavian vein are unremarkable. IMPRESSION: 1. Isolated occlusive left axillary  DVT. Electronically Signed   By: Lucrezia Europe M.D.   On: 07/17/2015 16:44    Assessment & Plan:  54 year old female patient with a history of locally advanced left upper lobe lung cancer status post concurrent chemoradiation therapy currently admitted to the hospital for worsening shortness of breath/cough  # Bilateral-left upper lobe and right lower lobe consolidation/pneumonia-mild improvement noted; 2-3 3 L of oxygen saturating at 90-94%.  # Isolated left axillary DVT-new-on Lovenox  # Elevated blood sugars- on steroids.  #  Locally advanced left upper lobe lung cancer/Stage III status post chemoradiation [finished finished Aug 10th, 2016]- clinically less likely progression of cancer   #History of breast cancer  Recommendations:  # Continue IV  vancomycin and Zosyn.  # Continue breathing treatments; hold off steroids because of elevated blood sugars  # Regards to her upper extremity swelling/DVT-continue Lovenox; also elevation  # Recommend spirometry/early ambulation.  # If no clinical improvement- recommend pulmonary evaluation/ID evaluation.  Above plan of care was discussed with the patient's nurse/patient in detail.    Cammie Sickle, MD 07/21/2015  9:25 AM

## 2015-07-21 NOTE — Plan of Care (Signed)
Problem: Respiratory: Goal: Respiratory status will improve Outcome: Progressing Pt c/o of rib cage and left shoulder pain with cough, abd binder ordered to improve pain, prn pain meds given with some improvement noted. Pt states pain is tolerable.   Goal: Ability to maintain a clear airway will improve Outcome: Progressing Pt coughing and deep breathing well, denies shortness of breath. Sputum specimen sent this shift per order.    Goal: Pain level will decrease Outcome: Progressing Pt c/o of rib cage and left shoulder pain with cough, abd binder ordered to improve pain, prn pain meds given with some improvement noted. Pt states pain is tolerable.   Goal: Identification of resources available to assist in meeting health care needs will improve Outcome: Progressing Respiratory giving scheduled breathing tx.

## 2015-07-22 ENCOUNTER — Inpatient Hospital Stay: Payer: No Typology Code available for payment source

## 2015-07-22 LAB — VANCOMYCIN, TROUGH: Vancomycin Tr: 10 ug/mL (ref 10–20)

## 2015-07-22 LAB — GLUCOSE, CAPILLARY
GLUCOSE-CAPILLARY: 92 mg/dL (ref 65–99)
GLUCOSE-CAPILLARY: 172 mg/dL — AB (ref 65–99)
GLUCOSE-CAPILLARY: 189 mg/dL — AB (ref 65–99)
Glucose-Capillary: 255 mg/dL — ABNORMAL HIGH (ref 65–99)

## 2015-07-22 LAB — CBC WITH DIFFERENTIAL/PLATELET
BASOS ABS: 0.1 10*3/uL (ref 0–0.1)
Basophils Relative: 1 %
EOS PCT: 3 %
Eosinophils Absolute: 0.4 10*3/uL (ref 0–0.7)
HEMATOCRIT: 34.6 % — AB (ref 35.0–47.0)
Hemoglobin: 11.2 g/dL — ABNORMAL LOW (ref 12.0–16.0)
LYMPHS ABS: 0.9 10*3/uL — AB (ref 1.0–3.6)
LYMPHS PCT: 6 %
MCH: 30.9 pg (ref 26.0–34.0)
MCHC: 32.6 g/dL (ref 32.0–36.0)
MCV: 94.8 fL (ref 80.0–100.0)
MONO ABS: 1.1 10*3/uL — AB (ref 0.2–0.9)
Monocytes Relative: 8 %
NEUTROS ABS: 11.7 10*3/uL — AB (ref 1.4–6.5)
Neutrophils Relative %: 82 %
Platelets: 229 10*3/uL (ref 150–440)
RBC: 3.64 MIL/uL — AB (ref 3.80–5.20)
RDW: 15.6 % — ABNORMAL HIGH (ref 11.5–14.5)
WBC: 14.1 10*3/uL — AB (ref 3.6–11.0)

## 2015-07-22 LAB — BASIC METABOLIC PANEL
ANION GAP: 8 (ref 5–15)
BUN: 10 mg/dL (ref 6–20)
CALCIUM: 8.8 mg/dL — AB (ref 8.9–10.3)
CO2: 27 mmol/L (ref 22–32)
Chloride: 101 mmol/L (ref 101–111)
Creatinine, Ser: 0.8 mg/dL (ref 0.44–1.00)
GLUCOSE: 141 mg/dL — AB (ref 65–99)
POTASSIUM: 3.5 mmol/L (ref 3.5–5.1)
Sodium: 136 mmol/L (ref 135–145)

## 2015-07-22 MED ORDER — SODIUM CHLORIDE 0.9 % IJ SOLN
10.0000 mL | INTRAMUSCULAR | Status: DC | PRN
  Administered 2015-07-22 (×2): 10 mL via INTRAVENOUS

## 2015-07-22 MED ORDER — VANCOMYCIN HCL 10 G IV SOLR
1500.0000 mg | Freq: Two times a day (BID) | INTRAVENOUS | Status: DC
  Administered 2015-07-23 – 2015-07-25 (×4): 1500 mg via INTRAVENOUS
  Filled 2015-07-22 (×7): qty 1500

## 2015-07-22 MED ORDER — VANCOMYCIN HCL 500 MG IV SOLR
500.0000 mg | Freq: Once | INTRAVENOUS | Status: AC
  Administered 2015-07-23: 500 mg via INTRAVENOUS
  Filled 2015-07-22: qty 500

## 2015-07-22 NOTE — H&P (Signed)
Pulmonary Critical Care  Initial Consult Note   Brittney Lam ZOX:[id number] DOB: 13-Aug-1961 DOA: 07/02/2015  Referring physician: Georgeanne Nim, NP PCP: [name], MD   Chief Complaint: Pneumonia  HPI: Brittney Lam is a 54 y.o. female with past history of DM HTN presented to the ED with increased shortness o breath cough and congestion. Patient was diagnosed with recurrence of LUL cancer and has been undergoing chemotherapy for this. Patient has finished RT and chemo. Now presented with increased shortness of breath cough and congestion. Patient has had significant weakness noted also. Patient has had pinkish sputum noted. CT scan done showed changes of consolidation with airspace disease felt initially to be due to radiation pneumonitis and she was started on prednisone as an outpatient. Patient did not seem to have improvement noted. Patient was admitted as it was felt that there may be an underlying infectious process. We are asked to see the patient to possibly do a bronchoscopy and possible biopsy.   Review of Systems:   ROS performed and is unremarkable other than noted in HPI  Past Medical History  Diagnosis Date  . Unspecified essential hypertension   . Kidney failure   . Personal history of tobacco use, presenting hazards to health   . Cataract   . Personal history of malignant neoplasm of breast   . Breast screening, unspecified   . Special screening for malignant neoplasms, colon   . Diabetes mellitus without complication ([place])   . MDRO (multiple drug resistant organisms) resistance   . H. pylori duodenitis   . Complication of anesthesia     BP DROPPED DURING LOBECTOMY IN 2014  . Cancer [facility]) 2001    left breast cancer s/p L/SN/R in 2001  . Cancer [facility]) 2014    right breast invasive CA, Er pos,Pr neg, Her 2 neg.  . Lung cancer, upper lobe ([place]) 2014    right upper lobe  . Breast cancer [facility])    Past Surgical History  Procedure Laterality Date  .  Intercostal nerve block  2005  . Cataract extraction w/ intraocular lens implant  2005  . Carpal tunnel release Right 2011  . Insertion central venous access device w/ subcutaneous port  12-07-12  . Breast lumpectomy Left 2001  . Breast surgery Right 2014    total mastectomy  . Right lung upper lobectomy  03/2013  . Lobectomy Right   . Cataract extraction    . Mastectomy Right   . Colonoscopy    . Endobronchial ultrasound N/A 02/05/2015    Procedure: ENDOBRONCHIAL ULTRASOUND;  Surgeon: Flora Lipps, MD;  Location: [facility] ORS;  Service: Cardiopulmonary;  Laterality: N/A;   Social History:  reports that she quit smoking about 2 years ago. Her smoking use included Cigarettes. She has a 15 pack-year smoking history. She has never used smokeless tobacco. She reports that she does not drink alcohol or use illicit drugs.  Allergies  Allergen Reactions  . Metformin Diarrhea and Nausea Only  . Other Rash    Sage Wipes    Family History  Problem Relation Age of Onset  . Diabetes Other   . Hyperlipidemia Other   . Hypertension Other     Prior to Admission medications   Medication Sig Start Date End Date Taking? Authorizing Provider  acetaminophen (TYLENOL) 325 MG tablet Take 650 mg by mouth every 6 (six) hours as needed for pain.   Yes Historical Provider, MD  calcium citrate-vitamin D (CITRACAL+D) 315-200 MG-UNIT tablet Take 1 tablet  by mouth 2 (two) times daily.   Yes Historical Provider, MD  glipiZIDE (GLUCOTROL XL) 5 MG 24 hr tablet Take by mouth. 09/20/14 09/20/15 Yes Historical Provider, MD  hydrochlorothiazide (HYDRODIURIL) 25 MG tablet Take 12.5 mg by mouth daily.    Yes Historical Provider, MD  letrozole (FEMARA) 2.5 MG tablet Take 2.5 mg by mouth daily.   Yes Historical Provider, MD  Multiple Vitamins-Minerals (MULTIVITAMIN PO) Take by mouth.   Yes Historical Provider, MD  naproxen sodium (ANAPROX) 220 MG tablet Take 220 mg by mouth every 8 (eight) hours as needed.   Yes Historical  Provider, MD  ondansetron (ZOFRAN) 4 MG tablet Take 1 tablet (4 mg total) by mouth every 6 (six) hours as needed for nausea or vomiting. 03/06/15  Yes Forest Gleason, MD  potassium chloride SA (K-DUR,KLOR-CON) 20 MEQ tablet Take 1 tablet (20 mEq total) by mouth daily. 05/08/15  Yes Forest Gleason, MD  simvastatin (ZOCOR) 10 MG tablet Take 10 mg by mouth daily.   Yes Historical Provider, MD  esomeprazole (NEXIUM) 20 MG capsule Take 1 capsule (20 mg total) by mouth every morning. Patient not taking: Reported on 07/31/2015 03/27/15   Noreene Filbert, MD  gabapentin (NEURONTIN) 300 MG capsule Take 300 mg by mouth as needed.    Historical Provider, MD  nicotine polacrilex (NICORETTE) 2 MG gum Take 2 mg by mouth 5 (five) times daily.     Historical Provider, MD  PARoxetine Mesylate (BRISDELLE PO) Take 1 tablet by mouth at bedtime.    Historical Provider, MD  venlafaxine (EFFEXOR) 37.5 MG tablet Take 1 tablet (37.5 mg total) by mouth daily. Patient not taking: Reported on 07/18/2015 04/17/15   Noreene Filbert, MD   Physical Exam: Filed Vitals:   07/22/15 0728 07/22/15 0820 07/22/15 1322 07/22/15 1445  BP:   114/62   Pulse: 107  115   Temp:   100.4 F (38 C) 98.6 F (37 C)  TempSrc:   Oral Oral  Resp:   20   Height:      Weight:      SpO2:  89% 91%     Wt Readings from Last 3 Encounters:  07/28/2015 65.318 kg (144 lb)  07/23/2015 65.7 kg (144 lb 13.5 oz)  07/17/15 65.8 kg (145 lb 1 oz)    General:  Appears calm and comfortable Eyes: PERRL, normal lids, irises & conjunctiva ENT: grossly normal hearing, lips & tongue Neck: no LAD, masses or thyromegaly Cardiovascular: RRR, no m/r/g. No LE edema. Respiratory: few ronchi noted. Has bronchial breath sounds noted also Abdomen: soft, nontender Skin: no rash or induration seen on limited exam Musculoskeletal: grossly normal tone BUE/BLE Psychiatric: grossly normal mood and affect Neurologic: grossly non-focal.          Labs on Admission:  Basic  Metabolic Panel:  Recent Labs Lab 07/17/15 1018 07/28/2015 1253 07/20/15 0615 07/21/15 0251 07/22/15 0850  NA 135 135 138 139 136  K 3.8 3.9 3.9 3.5 3.5  CL 101 101 103 106 101  CO2 '27 26 30 27 27  '$ GLUCOSE 172* 254* 132* 151* 141*  BUN '19 14 12 15 10  '$ CREATININE 0.89 0.67 0.75 0.85 0.80  CALCIUM 9.5 9.4 8.8* 9.3 8.8*  MG  --  2.1  --   --   --    Liver Function Tests:  Recent Labs Lab 07/17/15 1018 07/05/2015 1253 07/20/15 0615  AST '16 19 16  '$ ALT '19 20 17  '$ ALKPHOS 93 98 79  BILITOT 0.6  0.7 0.4  PROT 7.2 7.2 6.2*  ALBUMIN 3.5 3.4* 2.7*   No results for input(s): LIPASE, AMYLASE in the last 168 hours. No results for input(s): AMMONIA in the last 168 hours. CBC:  Recent Labs Lab 07/17/15 1018 07/05/2015 1253 07/20/15 0615 07/21/15 0251 07/22/15 0623  WBC 18.9* 20.5* 14.2* 15.8* 14.1*  NEUTROABS 17.6* 19.5*  --  14.1* 11.7*  HGB 12.6 12.0 10.8* 11.1* 11.2*  HCT 38.6 37.3 33.3* 33.5* 34.6*  MCV 94.3 94.4 94.6 94.8 94.8  PLT 261 247 217 215 229   Cardiac Enzymes: No results for input(s): CKTOTAL, CKMB, CKMBINDEX, TROPONINI in the last 168 hours.  BNP (last 3 results) No results for input(s): BNP in the last 8760 hours.  ProBNP (last 3 results) No results for input(s): PROBNP in the last 8760 hours.  CBG:  Recent Labs Lab 07/21/15 1610 07/21/15 2126 07/22/15 0707 07/22/15 1301 07/22/15 1620  GLUCAP 165* 190* 92 172* 189*    Radiological Exams on Admission: No results found. prior CT scan was reviewed and is attached below     EKG: Independently reviewed.  Assessment/Plan Active Problems:   Pneumonia   1. Bilateral Pneumonia -infectious vs radiation pneumonitis -presently she states that she is gradually improving -patient is on appropriate antibiotics at this time vanc and zosyn -the CT scan shows fairly extensive air bronchograms and consolidation -I would continue with antibiotics and follow CT after treatment is completed -if there is  no improvement would favor doing bronchoscopy with TBB -will repeat CXR now  Code Status: full code     I have personally obtained a history, examined the patient, evaluated laboratory and imaging results, formulated the assessment and plan and placed orders.  The Patient requires high complexity decision making for assessment and support.    Allyne Gee, MD [facility] Pulmonary Critical Care Medicine Sleep Medicine

## 2015-07-22 NOTE — Progress Notes (Signed)
Pt had episode of elevated HR 150's during nebulizer treatment/care which was asymptomatic. Pt currently sleeping with sinus tachycardia with HR currently 109. Pt resting quietly with eyes closed.

## 2015-07-22 NOTE — Care Management (Signed)
Admitted to [facility] with the diagnosis of pneumonia. Lives with husband, Thayer Jew, [phone]). No home health. No skilled facility. Uses no aids for ambulation. Home oxygen 2 liters per nasal cannula x 1 week thru [facility]. No insurance.  Arranged to get medications thru [employer] at [place] per Elease Etienne at the [employer] Trying for Disability for Breast Cancer. Did work for [employer]. Takes care of basic activities of daily living herself, drives. No falls. Good appetite.  Last seen Dr, Jeb Levering on Friday. Husband or daughter will transport. Shelbie Ammons RN MSN CCM Care Management [phone]

## 2015-07-22 NOTE — Progress Notes (Signed)
Brittney Lam   DOB:12-16-1960   WE#:[id number]   CC: Shortness of breath cough/pneumonia  Subjective:  Patient still is to have of shortness of breath or productive cough. Noted to have pinkish tinged sputum no frank hemoptysis. Patient however gets tachycardic when she gets up and ambulates. Her arm swelling is improved since she has elevated 8.  ROS: No nausea no vomiting or diarrhea.  Objective:  Filed Vitals:   07/21/15 2124 07/22/15 0535  BP: 111/63 103/58  Pulse: 129 114  Temp: 99.8 F (37.7 C) 98.1 F (36.7 C)  Resp:  20     Intake/Output Summary (Last 24 hours) at 07/22/15 0828 Last data filed at 07/22/15 0600  Gross per 24 hour  Intake   1750 ml  Output      0 ml  Net   1750 ml    GENERAL:alert, no distress; having coughing spells. She is on oxygen. She is alone. SKIN: No rash. EYES: normal, Conjunctiva are pink and non-injected, sclera clear OROPHARYNX:no exudate, no erythema and lips, buccal mucosa, and tongue normal  NECK: No abnormality noted LYMPH:  no palpable lymphadenopathy in the cervical, axillary or inguinal LUNGS: Bilateral decreased air entry [however improving] HEART: Tachycardic ; regular  rhythm and no murmurs and no lower extremity edema ABDOMEN:abdomen soft, non-tender and normal bowel sounds Musculoskeletal:no cyanosis of digits and no clubbing; left upper extremity swelling compared to the right. Pulses intact NEURO: alert & oriented x 3 with fluent speech, no focal motor/sensory deficits   Labs:  Lab Results  Component Value Date   WBC 15.8* 07/21/2015   HGB 11.1* 07/21/2015   HCT 33.5* 07/21/2015   MCV 94.8 07/21/2015   PLT 215 07/21/2015   NEUTROABS 14.1* 07/21/2015    Lab Results  Component Value Date   NA 139 07/21/2015   K 3.5 07/21/2015   CL 106 07/21/2015   CO2 27 07/21/2015    Studies:  No results found.  Assessment & Plan:  54 year old female patient with a history of locally advanced left upper lobe lung cancer  status post concurrent chemoradiation therapy currently admitted to the hospital for worsening shortness of breath/cough  # Bilateral-left upper lobe and right lower lobe consolidation/pneumonia- 2-3 3 L of oxygen saturating at 90-94%- continued mild improvement noted;  # Isolated left axillary DVT-new-on Lovenox  # Elevated blood sugars- on steroids; currently off steroids  # Locally advanced left upper lobe lung cancer/Stage III status post chemoradiation [finished finished Aug 10th, 2016]- clinically less likely progression of cancer. A  #History of breast cancer  Recommendations:  # Continue IV vancomycin and Zosyn. Continue breathing treatments; hold off steroids because of elevated blood sugars. As there is no significant clinical improvement-I recommend pulmonary evaluation.   # Regards to her upper extremity swelling/DVT-continue Lovenox; also elevation- improved.   # Recommend spirometry/early ambulation.  Above plan of care was discussed with the patient's nurse/patient in detail.    Cammie Sickle, MD 07/22/2015  8:28 AM

## 2015-07-22 NOTE — Plan of Care (Addendum)
Problem: Respiratory: Goal: Respiratory status will improve Outcome: Progressing Patient remains on O2. Still with productive cough.  Had low grade fever today. 100.4, now WDL.    Goal: Ability to maintain a clear airway will improve Outcome: Progressing Patient up walking to bathroom. Coughing and deep breathing. Pulmonary consult completed. IVF and IV ABX infusing. Goal: Pain level will decrease Outcome: Progressing No c/o pain today.

## 2015-07-22 NOTE — Progress Notes (Addendum)
ANTIBIOTIC CONSULT NOTE - INITIAL  Pharmacy Consult for Zosyn/Vancomycin Dosing (Day 3) Indication: rule out pneumonia  Allergies  Allergen Reactions  . Metformin Diarrhea and Nausea Only  . Other Rash    Sage Wipes    Patient Measurements: Height: '5\' 2"'$  (157.5 cm) Weight: 144 lb (65.318 kg) IBW/kg (Calculated) : 50.1 Adjusted Body Weight: 56 kg  Vital Signs: Temp: 98.7 F (37.1 C) (11/21 2108) Temp Source: Oral (11/21 2108) BP: 125/58 mmHg (11/21 2108) Pulse Rate: 128 (11/21 2108) Intake/Output from previous day: 11/20 0701 - 11/21 0700 In: 1324 [P.O.:600; I.V.:600; IV Piggyback:550] Out: -  Intake/Output from this shift: Total I/O In: 925 [I.V.:834; IV Piggyback:91] Out: -   Labs:  Recent Labs  07/20/15 0615 07/21/15 0251 07/22/15 0623 07/22/15 0850  WBC 14.2* 15.8* 14.1*  --   HGB 10.8* 11.1* 11.2*  --   PLT 217 215 229  --   CREATININE 0.75 0.85  --  0.80   Estimated Creatinine Clearance: 70.5 mL/min (by C-G formula based on Cr of 0.8).  Recent Labs  07/21/15 0531 07/22/15 2037  VANCOTROUGH 9* 10     Microbiology: Recent Results (from the past 720 hour(s))  Culture, sputum-assessment     Status: None   Collection Time: 07/20/15  7:33 AM  Result Value Ref Range Status   Specimen Description EXPECTORATED SPUTUM  Final   Special Requests NONE  Final   Sputum evaluation   Final    Sputum specimen not acceptable for testing.  Please recollect.   Results Called to: [facility] PARDINI AT 4010 ON 07/20/15 CTJ    Report Status 07/20/2015 FINAL  Final    Medical History: Past Medical History  Diagnosis Date  . Unspecified essential hypertension   . Kidney failure   . Personal history of tobacco use, presenting hazards to health   . Cataract   . Personal history of malignant neoplasm of breast   . Breast screening, unspecified   . Special screening for malignant neoplasms, colon   . Diabetes mellitus without complication ([place])   . MDRO (multiple drug  resistant organisms) resistance   . H. pylori duodenitis   . Complication of anesthesia     BP DROPPED DURING LOBECTOMY IN 2014  . Cancer [facility]) 2001    left breast cancer s/p L/SN/R in 2001  . Cancer [facility]) 2014    right breast invasive CA, Er pos,Pr neg, Her 2 neg.  . Lung cancer, upper lobe ([place]) 2014    right upper lobe  . Breast cancer ([facility])     Medications:  Scheduled:  . calcium citrate-vitamin D  1 tablet Oral BID  . enoxaparin (LOVENOX) injection  1.5 mg/kg Subcutaneous Q24H  . glipiZIDE  5 mg Oral Q breakfast  . hydrochlorothiazide  12.5 mg Oral Daily  . insulin aspart  0-15 Units Subcutaneous TID WC  . insulin aspart  0-5 Units Subcutaneous QHS  . ipratropium-albuterol  3 mL Nebulization Q6H  . letrozole  2.5 mg Oral Daily  . pantoprazole  40 mg Oral Daily  . PARoxetine  10 mg Oral QHS  . piperacillin-tazobactam (ZOSYN)  IV  3.375 g Intravenous 3 times per day  . potassium chloride SA  20 mEq Oral Daily  . simvastatin  10 mg Oral Daily  . [START ON 07/23/2015] vancomycin  1,500 mg Intravenous Q12H  . vancomycin  500 mg Intravenous Once  . vancomycin  1,000 mg Intravenous Q12H   Infusions:  . sodium chloride 50 mL/hr at  07/21/15 6153   Assessment: Pharmacy consulted to dose vancomycin and Zosyn for 54 yo female being treated for left upper lobe PNA.   1112 2037 vancomycin trough subtherapeutic, doses and labs appropriately timed.   Goal of Therapy:  Vancomycin trough level 15-20 mcg/ml  Plan:  Increase to vancomycin 1.5 gm IV Q12H predicted trough 15 mcg/mL will continue to monitor and adjust as needed to maintain trough 15 to 20 mcg/mL.  Pharmacy will continue to monitor and adjust per consult.    Laural Benes, Pharm.D., BCPS Clinical Pharmacist 07/22/2015,9:45 PM

## 2015-07-23 ENCOUNTER — Encounter: Payer: Self-pay | Admitting: Oncology

## 2015-07-23 DIAGNOSIS — I10 Essential (primary) hypertension: Secondary | ICD-10-CM

## 2015-07-23 DIAGNOSIS — J90 Pleural effusion, not elsewhere classified: Secondary | ICD-10-CM

## 2015-07-23 DIAGNOSIS — J189 Pneumonia, unspecified organism: Secondary | ICD-10-CM

## 2015-07-23 DIAGNOSIS — Z87891 Personal history of nicotine dependence: Secondary | ICD-10-CM

## 2015-07-23 DIAGNOSIS — Z853 Personal history of malignant neoplasm of breast: Secondary | ICD-10-CM

## 2015-07-23 DIAGNOSIS — C3412 Malignant neoplasm of upper lobe, left bronchus or lung: Secondary | ICD-10-CM | POA: Diagnosis not present

## 2015-07-23 DIAGNOSIS — J181 Lobar pneumonia, unspecified organism: Secondary | ICD-10-CM

## 2015-07-23 DIAGNOSIS — I82622 Acute embolism and thrombosis of deep veins of left upper extremity: Secondary | ICD-10-CM

## 2015-07-23 DIAGNOSIS — C3431 Malignant neoplasm of lower lobe, right bronchus or lung: Secondary | ICD-10-CM

## 2015-07-23 DIAGNOSIS — Z79899 Other long term (current) drug therapy: Secondary | ICD-10-CM

## 2015-07-23 DIAGNOSIS — E119 Type 2 diabetes mellitus without complications: Secondary | ICD-10-CM

## 2015-07-23 LAB — BASIC METABOLIC PANEL
Anion gap: 8 (ref 5–15)
BUN: 9 mg/dL (ref 6–20)
CHLORIDE: 101 mmol/L (ref 101–111)
CO2: 29 mmol/L (ref 22–32)
Calcium: 9.2 mg/dL (ref 8.9–10.3)
Creatinine, Ser: 0.76 mg/dL (ref 0.44–1.00)
GFR calc non Af Amer: >60 mL/min (ref >60)
Glucose, Bld: 142 mg/dL — ABNORMAL HIGH (ref 65–99)
POTASSIUM: 4 mmol/L (ref 3.5–5.1)
SODIUM: 138 mmol/L (ref 135–145)

## 2015-07-23 LAB — RAPID HIV SCREEN (HIV 1/2 AB+AG)
HIV 1/2 ANTIBODIES: NONREACTIVE
HIV-1 P24 ANTIGEN - HIV24: NONREACTIVE

## 2015-07-23 LAB — CBC WITH DIFFERENTIAL/PLATELET
Basophils Absolute: 0.1 10*3/uL (ref 0–0.1)
Basophils Relative: 1 %
Eosinophils Absolute: 0.5 10*3/uL (ref 0–0.7)
Eosinophils Relative: 3 %
HEMATOCRIT: 33.9 % — AB (ref 35.0–47.0)
HEMOGLOBIN: 11.2 g/dL — AB (ref 12.0–16.0)
LYMPHS ABS: 0.6 10*3/uL — AB (ref 1.0–3.6)
Lymphocytes Relative: 4 %
MCH: 31.3 pg (ref 26.0–34.0)
MCHC: 33.1 g/dL (ref 32.0–36.0)
MCV: 94.6 fL (ref 80.0–100.0)
MONOS PCT: 7 %
Monocytes Absolute: 1.1 10*3/uL — ABNORMAL HIGH (ref 0.2–0.9)
NEUTROS ABS: 12.9 10*3/uL — AB (ref 1.4–6.5)
NEUTROS PCT: 85 %
Platelets: 218 10*3/uL (ref 150–440)
RBC: 3.58 MIL/uL — AB (ref 3.80–5.20)
RDW: 15.5 % — ABNORMAL HIGH (ref 11.5–14.5)
WBC: 15.2 10*3/uL — ABNORMAL HIGH (ref 3.6–11.0)

## 2015-07-23 LAB — GLUCOSE, CAPILLARY
GLUCOSE-CAPILLARY: 122 mg/dL — AB (ref 65–99)
Glucose-Capillary: 114 mg/dL — ABNORMAL HIGH (ref 65–99)
Glucose-Capillary: 91 mg/dL (ref 65–99)
Glucose-Capillary: 217 mg/dL — ABNORMAL HIGH (ref 65–99)

## 2015-07-23 MED ORDER — AZITHROMYCIN 250 MG PO TABS
250.0000 mg | ORAL_TABLET | Freq: Every day | ORAL | Status: AC
  Administered 2015-07-24 – 2015-07-27 (×4): 250 mg via ORAL
  Filled 2015-07-23 (×4): qty 1

## 2015-07-23 MED ORDER — AZITHROMYCIN 250 MG PO TABS
500.0000 mg | ORAL_TABLET | Freq: Every day | ORAL | Status: AC
  Administered 2015-07-23: 500 mg via ORAL
  Filled 2015-07-23: qty 2

## 2015-07-23 MED ORDER — SODIUM CHLORIDE 3 % IN NEBU
5.0000 mL | INHALATION_SOLUTION | Freq: Once | RESPIRATORY_TRACT | Status: AC | PRN
  Administered 2015-07-23: 5 mL via RESPIRATORY_TRACT
  Filled 2015-07-23 (×2): qty 8

## 2015-07-23 NOTE — Progress Notes (Signed)
Called Dr  Posey Pronto patient had HR in 160's sustained for a full minute per Telemetry, patient had been up to bathroom, o2 off while up.  Patient is non symptomatic    HR back to the mid 120's after getting back in bed, BP   112/64   Sat o2 94% on 3 LPM   Temp 99.3, per Dr Posey Pronto no new orders call her back if HR goes back up.

## 2015-07-23 NOTE — Consult Note (Addendum)
[facility] Infectious Disease     Reason for Consult:PNA   Referring Physician: Mayra Neer Date of Admission:  07/27/2015   Active Problems:   Pneumonia  HPI: Brittney Lam is a 54 y.o. female admitted 11/18 with cough, sob.  She has hx of breast and lung cancer s/p R upper lobe lobectomy.  She was also diagnosed with a left upper lobe mass in spring of 2016, thought to be second lung primary. Patient has finished radiation therapy as well as chemotherapy.  Had CT chest showing consolidation in LUL and RLL.  Was admitted with wbc 18.9 and started on vanco and zosyn. CX was sent but not acceptable. Had continued to have elevated wbc and low grade fevers.  Currently reports feeling a little better- sputum is now thinned out. 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 No hx TB, no sick contacts. Has a pet dog but no other animal exposure. Lives with her husband, father and grandchild. Former smoker  Past Medical History  Diagnosis Date  . Unspecified essential hypertension   . Kidney failure   . Personal history of tobacco use, presenting hazards to health   . Cataract   . Personal history of malignant neoplasm of breast   . Breast screening, unspecified   . Special screening for malignant neoplasms, colon   . Diabetes mellitus without complication ([place])   . MDRO (multiple drug resistant organisms) resistance   . H. pylori duodenitis   . Complication of anesthesia     BP DROPPED DURING LOBECTOMY IN 2014  . Cancer [facility]) 2001    left breast cancer s/p L/SN/R in 2001  . Cancer [facility]) 2014    right breast invasive CA, Er pos,Pr neg, Her 2 neg.  . Lung cancer, upper lobe ([place]) 2014    right upper lobe  . Breast cancer [facility])    Past Surgical History  Procedure Laterality Date  . Intercostal nerve block  2005  . Cataract extraction w/ intraocular lens implant  2005  . Carpal tunnel release Right 2011  . Insertion central venous access device  w/ subcutaneous port  12-07-12  . Breast lumpectomy Left 2001  . Breast surgery Right 2014    total mastectomy  . Right lung upper lobectomy  03/2013  . Lobectomy Right   . Cataract extraction    . Mastectomy Right   . Colonoscopy    . Endobronchial ultrasound N/A 02/05/2015    Procedure: ENDOBRONCHIAL ULTRASOUND;  Surgeon: Flora Lipps, MD;  Location: [facility] ORS;  Service: Cardiopulmonary;  Laterality: N/A;   Social History  Substance Use Topics  . Smoking status: Former Smoker -- 0.50 packs/day for 30 years    Types: Cigarettes    Quit date: 02/03/2013  . Smokeless tobacco: Never Used     Comment: using 54m nicorette gum- 1/2 piece, 10 times per day  . Alcohol Use: No   Family History  Problem Relation Age of Onset  . Diabetes Other   . Hyperlipidemia Other   . Hypertension Other     Allergies:  Allergies  Allergen Reactions  . Metformin Diarrhea and Nausea Only  . Other Rash    Sage Wipes    Current antibiotics: Antibiotics Given (last 72 hours)    Date/Time Action Medication Dose Rate   07/20/15 1612 Given   piperacillin-tazobactam (ZOSYN) IVPB 3.375 g 3.375 g 12.5 mL/hr   07/20/15 1752 Given   vancomycin (VANCOCIN) IVPB 750  mg/150 ml premix 750 mg 150 mL/hr   07/20/15 2341 Given   piperacillin-tazobactam (ZOSYN) IVPB 3.375 g 3.375 g 12.5 mL/hr   07/21/15 5009 Given   vancomycin (VANCOCIN) IVPB 1000 mg/200 mL premix 1,000 mg 200 mL/hr   07/21/15 1109 Given  [medication was not sent by pharmacy]   piperacillin-tazobactam (ZOSYN) IVPB 3.375 g 3.375 g 12.5 mL/hr   07/21/15 1705 Given   piperacillin-tazobactam (ZOSYN) IVPB 3.375 g 3.375 g 12.5 mL/hr   07/21/15 2127 Given  [pt preference]   vancomycin (VANCOCIN) IVPB 1000 mg/200 mL premix 1,000 mg 200 mL/hr   07/21/15 2341 Given   piperacillin-tazobactam (ZOSYN) IVPB 3.375 g 3.375 g 12.5 mL/hr   07/22/15 0810 Given   piperacillin-tazobactam (ZOSYN) IVPB 3.375 g 3.375 g 12.5 mL/hr   07/22/15 3818 Given   vancomycin  (VANCOCIN) IVPB 1000 mg/200 mL premix 1,000 mg 200 mL/hr   07/22/15 1713 Given   piperacillin-tazobactam (ZOSYN) IVPB 3.375 g 3.375 g 12.5 mL/hr   07/22/15 2139 Given   vancomycin (VANCOCIN) IVPB 1000 mg/200 mL premix 1,000 mg 200 mL/hr   07/23/15 0035 Given   piperacillin-tazobactam (ZOSYN) IVPB 3.375 g 3.375 g 12.5 mL/hr   07/23/15 0035 Given   vancomycin (VANCOCIN) 500 mg in sodium chloride 0.9 % 100 mL IVPB 500 mg 100 mL/hr   07/23/15 0803 Given   piperacillin-tazobactam (ZOSYN) IVPB 3.375 g 3.375 g 12.5 mL/hr      MEDICATIONS: . calcium citrate-vitamin D  1 tablet Oral BID  . enoxaparin (LOVENOX) injection  1.5 mg/kg Subcutaneous Q24H  . glipiZIDE  5 mg Oral Q breakfast  . hydrochlorothiazide  12.5 mg Oral Daily  . insulin aspart  0-15 Units Subcutaneous TID WC  . insulin aspart  0-5 Units Subcutaneous QHS  . ipratropium-albuterol  3 mL Nebulization Q6H  . letrozole  2.5 mg Oral Daily  . pantoprazole  40 mg Oral Daily  . PARoxetine  10 mg Oral QHS  . piperacillin-tazobactam (ZOSYN)  IV  3.375 g Intravenous 3 times per day  . potassium chloride SA  20 mEq Oral Daily  . simvastatin  10 mg Oral Daily  . vancomycin  1,500 mg Intravenous Q12H    Review of Systems - 11 systems reviewed and negative per HPI   OBJECTIVE: Temp:  [98.6 F (37 C)-99.1 F (37.3 C)] 99 F (37.2 C) (11/22 1323) Pulse Rate:  [108-128] 114 (11/22 1323) Resp:  [18] 18 (11/22 0528) BP: (105-125)/(52-62) 108/52 mmHg (11/22 1323) SpO2:  [94 %-97 %] 95 % (11/22 1323) Physical Exam  Constitutional:  oriented to person, place, and time. Dyspneic HENT: [place]/AT, PERRLA, no scleral icterus Mouth/Throat: Oropharynx is clear and moist. No oropharyngeal exudate.  Cardiovascular: tachy Pulmonary/Chest: bronchial breath sounds Lul, rhonchi throughout  Neck = supple, no nuchal rigidity Abdominal: Soft. Bowel sounds are normal.  exhibits no distension. There is no tenderness.  Lymphadenopathy: no cervical  adenopathy. No axillary adenopathy Neurological: alert and oriented to person, place, and time.  Skin: Skin is warm and dry. No rash noted. No erythema.  Psychiatric: a normal mood and affect.  behavior is normal.  Access portacath L chest wall wnl  LABS: Results for orders placed or performed during the hospital encounter of 07/28/2015 (from the past 48 hour(s))  Glucose, capillary     Status: Abnormal   Collection Time: 07/21/15  4:10 PM  Result Value Ref Range   Glucose-Capillary 165 (H) 65 - 99 mg/dL  Glucose, capillary     Status: Abnormal  Collection Time: 07/21/15  9:26 PM  Result Value Ref Range   Glucose-Capillary 190 (H) 65 - 99 mg/dL   Comment 1 Notify RN   CBC with Differential     Status: Abnormal   Collection Time: 07/22/15  6:23 AM  Result Value Ref Range   WBC 14.1 (H) 3.6 - 11.0 K/uL   RBC 3.64 (L) 3.80 - 5.20 MIL/uL   Hemoglobin 11.2 (L) 12.0 - 16.0 g/dL   HCT 34.6 (L) 35.0 - 47.0 %   MCV 94.8 80.0 - 100.0 fL   MCH 30.9 26.0 - 34.0 pg   MCHC 32.6 32.0 - 36.0 g/dL   RDW 15.6 (H) 11.5 - 14.5 %   Platelets 229 150 - 440 K/uL   Neutrophils Relative % 82 %   Neutro Abs 11.7 (H) 1.4 - 6.5 K/uL   Lymphocytes Relative 6 %   Lymphs Abs 0.9 (L) 1.0 - 3.6 K/uL   Monocytes Relative 8 %   Monocytes Absolute 1.1 (H) 0.2 - 0.9 K/uL   Eosinophils Relative 3 %   Eosinophils Absolute 0.4 0 - 0.7 K/uL   Basophils Relative 1 %   Basophils Absolute 0.1 0 - 0.1 K/uL  Glucose, capillary     Status: None   Collection Time: 07/22/15  7:07 AM  Result Value Ref Range   Glucose-Capillary 92 65 - 99 mg/dL  Basic metabolic panel     Status: Abnormal   Collection Time: 07/22/15  8:50 AM  Result Value Ref Range   Sodium 136 135 - 145 mmol/L   Potassium 3.5 3.5 - 5.1 mmol/L   Chloride 101 101 - 111 mmol/L   CO2 27 22 - 32 mmol/L   Glucose, Bld 141 (H) 65 - 99 mg/dL   BUN 10 6 - 20 mg/dL   Creatinine, Ser 0.80 0.44 - 1.00 mg/dL   Calcium 8.8 (L) 8.9 - 10.3 mg/dL   GFR calc non Af  Amer >60 >60 mL/min   GFR calc Af Amer >60 >60 mL/min    Comment: (NOTE) The eGFR has been calculated using the CKD EPI equation. This calculation has not been validated in all clinical situations. eGFR's persistently <60 mL/min signify possible Chronic Kidney Disease.    Anion gap 8 5 - 15  Glucose, capillary     Status: Abnormal   Collection Time: 07/22/15  1:01 PM  Result Value Ref Range   Glucose-Capillary 172 (H) 65 - 99 mg/dL  Glucose, capillary     Status: Abnormal   Collection Time: 07/22/15  4:20 PM  Result Value Ref Range   Glucose-Capillary 189 (H) 65 - 99 mg/dL  Vancomycin, trough     Status: None   Collection Time: 07/22/15  8:37 PM  Result Value Ref Range   Vancomycin Tr 10 10 - 20 ug/mL  Glucose, capillary     Status: Abnormal   Collection Time: 07/22/15  9:55 PM  Result Value Ref Range   Glucose-Capillary 255 (H) 65 - 99 mg/dL  CBC with Differential     Status: Abnormal   Collection Time: 07/23/15  5:05 AM  Result Value Ref Range   WBC 15.2 (H) 3.6 - 11.0 K/uL   RBC 3.58 (L) 3.80 - 5.20 MIL/uL   Hemoglobin 11.2 (L) 12.0 - 16.0 g/dL   HCT 33.9 (L) 35.0 - 47.0 %   MCV 94.6 80.0 - 100.0 fL   MCH 31.3 26.0 - 34.0 pg   MCHC 33.1 32.0 - 36.0 g/dL   RDW  15.5 (H) 11.5 - 14.5 %   Platelets 218 150 - 440 K/uL   Neutrophils Relative % 85 %   Neutro Abs 12.9 (H) 1.4 - 6.5 K/uL   Lymphocytes Relative 4 %   Lymphs Abs 0.6 (L) 1.0 - 3.6 K/uL   Monocytes Relative 7 %   Monocytes Absolute 1.1 (H) 0.2 - 0.9 K/uL   Eosinophils Relative 3 %   Eosinophils Absolute 0.5 0 - 0.7 K/uL   Basophils Relative 1 %   Basophils Absolute 0.1 0 - 0.1 K/uL  Basic metabolic panel     Status: Abnormal   Collection Time: 07/23/15  5:05 AM  Result Value Ref Range   Sodium 138 135 - 145 mmol/L   Potassium 4.0 3.5 - 5.1 mmol/L   Chloride 101 101 - 111 mmol/L   CO2 29 22 - 32 mmol/L   Glucose, Bld 142 (H) 65 - 99 mg/dL   BUN 9 6 - 20 mg/dL   Creatinine, Ser 0.76 0.44 - 1.00 mg/dL    Calcium 9.2 8.9 - 10.3 mg/dL   GFR calc non Af Amer >60 >60 mL/min   GFR calc Af Amer >60 >60 mL/min    Comment: (NOTE) The eGFR has been calculated using the CKD EPI equation. This calculation has not been validated in all clinical situations. eGFR's persistently <60 mL/min signify possible Chronic Kidney Disease.    Anion gap 8 5 - 15  Glucose, capillary     Status: Abnormal   Collection Time: 07/23/15  7:27 AM  Result Value Ref Range   Glucose-Capillary 122 (H) 65 - 99 mg/dL  Glucose, capillary     Status: Abnormal   Collection Time: 07/23/15 11:31 AM  Result Value Ref Range   Glucose-Capillary 114 (H) 65 - 99 mg/dL   No components found for: ESR, C REACTIVE PROTEIN MICRO: Recent Results (from the past 720 hour(s))  Culture, sputum-assessment     Status: None   Collection Time: 07/20/15  7:33 AM  Result Value Ref Range Status   Specimen Description EXPECTORATED SPUTUM  Final   Special Requests NONE  Final   Sputum evaluation   Final    Sputum specimen not acceptable for testing.  Please recollect.   Results Called to: [facility] AT 9833 ON 07/20/15 CTJ    Report Status 07/20/2015 FINAL  Final    IMAGING: Dg Chest 1 View  07/22/2015  CLINICAL DATA:  Recurrent left upper lobe lung cancer for which the patient is currently being treated, presenting with acutely worsening shortness of breath, cough and chest congestion. EXAM: Portable CHEST 1 VIEW COMPARISON:  CT chest 07/18/2015 and earlier.  PET-CT 06/14/2015. FINDINGS: Suboptimal inspiration. Cardiac silhouette upper normal in size. Dense airspace consolidation in the left upper lobe with air bronchograms, unchanged since the CT 4 days ago. Improved aeration in the right upper lobe, though patchy airspace opacities persist. Consolidation medially in the left lower lobe, unchanged. Small bilateral pleural effusions, unchanged. No new pulmonary parenchymal abnormalities. Left subclavian Port-A-Cath tip projects at or near the  cavoatrial junction. IMPRESSION: Since the CT chest 4 days ago: 1. Improved aeration in the right upper lobe, though patchy airspace opacities persist indicating residual pneumonia. 2. 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 3. No change in the consolidation in the medial right lower lobe, likely post radiation pneumonitis. 4. No new abnormalities. Electronically Signed   By: Sherran Needs.D.  On: 07/22/2015 19:33   Ct Angio Chest Pe W/cm &/or Wo Cm  07/18/2015  CLINICAL DATA:  Short of breath for several weeks EXAM: CT ANGIOGRAPHY CHEST WITH CONTRAST TECHNIQUE: Multidetector CT imaging of the chest was performed using the standard protocol during bolus administration of intravenous contrast. Multiplanar CT image reconstructions and MIPs were obtained to evaluate the vascular anatomy. CONTRAST:  141m OMNIPAQUE IOHEXOL 350 MG/ML SOLN COMPARISON:  PET-CT 06/14/2015 FINDINGS: There are no filling defects in the pulmonary arterial tree to suggest acute pulmonary thromboembolism. Mediastinal adenopathy is not significantly changed since the PET-CT from 1 month ago. Left subclavian Port-A-Cath is stable. Consolidation within the apical left upper lobe and superior segment of the right lower lobe has markedly worsened. Very small bilateral pleural effusions have developed. No pneumothorax. The 1.4 cm left upper lobe lung mass on image 43 of series 8 is stable. Postoperative changes from right mastectomy are again noted. Bilateral rib fractures again noted. Right rib fractures are noticeably displaced. There are sclerotic areas in upper left antral lateral ribs without fractures which may represent rib lesions. There are also fractures and left mid to lower anterior ribs with some evidence of healing. These are all not significantly changed. Review of the MIP images confirms the above findings. IMPRESSION: No  evidence of acute pulmonary thromboembolism Consolidation in the left upper lobe and superior segment of the right lower lobe has worsened. This may represent pneumonia, pneumonitis, or infiltration with tumor. New very small pleural effusions. Stable mediastinal adenopathy. Bilateral of rib fractures are again noted and are not significantly changed. They may represent pathologic fractures. Electronically Signed   By: AMarybelle KillingsM.D.   On: 07/18/2015 11:02   [place]Venous Img Upper Uni Left  07/05/2015  CLINICAL DATA:  Left upper extremity swelling x1 month. Diabetes. Left subclavian surgically placed port catheter. EXAM: LEFT UPPER EXTREMITY VENOUS DOPPLER ULTRASOUND TECHNIQUE: Gray-scale sonography with graded compression, as well as color Doppler and duplex ultrasound were performed to evaluate the upper extremity deep venous system from the level of the subclavian vein and including the jugular, axillary, basilic and upper cephalic vein. Spectral Doppler was utilized to evaluate flow at rest and with distal augmentation maneuvers. COMPARISON:  CT 07/18/2015 and previous FINDINGS: Thrombus within deep veins: Noncompressible thrombus in the axillary vein over short segment. Brachial, radial, ulnar, basilic, and cephalic veins remain patent and compressible, with somewhat monophasic waveforms probably related to the central obstruction. There is a monophasic antegrade waveform in the visualized subclavian vein, without evidence of transmitted right atrial pulsations. Other findings: Images of contralateral subclavian vein are unremarkable. IMPRESSION: 1. Isolated occlusive left axillary  DVT. Electronically Signed   By: DLucrezia EuropeM.D.   On: 07/11/2015 16:44    Assessment:   PORVILLE WIDMANNis a 54y.o. female with lung cancer s/p chemoradiation with cough, sob, hemoptysis. CT shows extensive infiltrate and consolidation. Unable to produce sputum.  INterestingly she had a process noted in CT PET Oct 14th.  This has clearly progressed.   I see mention of her being started on prednisone in Oct 14th onc visit for possible radiation pneumonitis. Had dry hacking cough at that time.  I suspect radiation pneumonitis but bacterial, atypical, fungal or mycobacterial infection could also be present.  I would suggest proceeding with bronchoscopy for culture and bxp.    Recommendations Check urine legionella Check HIV Cont vanco, zosyn Try to induce sputum. Add atypical coverage with azithromycin  Suggest bronch sooner rather  than later to guide therapy - abx vs steroids. Thank you very much for allowing me to participate in the care of this patient. Please call with questions.   Cheral Marker. Ola Spurr, MD

## 2015-07-23 NOTE — Plan of Care (Signed)
Problem: Respiratory: Goal: Respiratory status will improve Outcome: Progressing Pt is on 3 L [place] with normal oxygen saturation. Continues to receive breathing treatments and antibiotics.  Goal: Pain level will decrease Outcome: Progressing No c/o pain or discomfort at this time.  Moderate fall, encouraged to call for assistance when needed.

## 2015-07-23 NOTE — Progress Notes (Addendum)
Patient had no c/o pain this shift  VSS for most of shift, at end of shift HR went up to the 160's while patient was up to the bathroom and came back down to the 120's on return to bed, MD aware no new orders continue to monitor, shift shift nurse aware and is following for any increase in HR   No c/o shortness of breath this shift Continues on IV  ABX

## 2015-07-23 NOTE — Progress Notes (Signed)
Brittney Lam   DOB:1961/04/10   FT#:[id number]   CC: Shortness of breath cough/pneumonia  Subjective:  Patient was admitted on November 18 for progressive shortness of breath, cough, CT scan noted with pulmonary infiltrate. She is seen today in the absence of Dr. Rogue Bussing as well as [place]. She reports overall feeling somewhat better but still with very dry, hacking cough. She still has some pink/red tinged sputum following nebulizer treatments, no frank hemoptysis. Patient reports left arm edema has decreased with starting of Lovenox as well as elevation.  Review of Systems  Constitutional: Positive for malaise/fatigue. Negative for fever, chills, weight loss and diaphoresis.  HENT: Positive for congestion. Negative for nosebleeds and sore throat.  Eyes: Negative for blurred vision, double vision, photophobia, pain, discharge and redness.  Respiratory: Positive for cough, pink/red sputum production, shortness of breath, wheezing and stridor. Negative for hemoptysis.  Cardiovascular: Positive for chest pain. Negative for palpitations, orthopnea, claudication, leg swelling and PND.  Gastrointestinal: Negative for heartburn, nausea, vomiting, abdominal pain, diarrhea, constipation, blood in stool and melena.  Genitourinary: Negative.  Musculoskeletal: Negative.  Skin: Negative.  Neurological: Positive for weakness. Negative for dizziness, tingling, focal weakness, seizures and headaches.  Endo/Heme/Allergies: Does not bruise/bleed easily.  Psychiatric/Behavioral: Negative for depression. The patient is not nervous/anxious and does not have insomnia.   Objective:  Filed Vitals:   07/23/15 0943 07/23/15 1323  BP: 105/62 108/52  Pulse: 109 114  Temp: 99.1 F (37.3 C) 99 F (37.2 C)  Resp:       Intake/Output Summary (Last 24 hours) at 07/23/15 1524 Last data filed at 07/23/15 0900  Gross per 24 hour  Intake   1285 ml  Output      0 ml  Net   1285 ml    GENERAL: Alert, no  distress; having coughing spells. She is on oxygen.  SKIN: No rash. EYES: normal, Conjunctiva are pink and non-injected, sclera clear OROPHARYNX: No exudate, no erythema and lips, buccal mucosa, and tongue normal  NECK: No abnormality noted LYMPH:  No palpable lymphadenopathy in the cervical, axillary or inguinal LUNGS: Bilateral decreased air entry, bilateral rhonchi present throughout all lung fields. HEART: Tachycardia; regular  rhythm and no murmurs and no lower extremity edema ABDOMEN: Abdomen soft, non-tender and normal bowel sounds Musculoskeletal: No cyanosis of digits and no clubbing; left upper extremity swelling compared to the right. Pulses intact NEURO: Alert & oriented x 3 with fluent speech, no focal motor/sensory deficits   Labs:  Lab Results  Component Value Date   WBC 15.2* 07/23/2015   HGB 11.2* 07/23/2015   HCT 33.9* 07/23/2015   MCV 94.6 07/23/2015   PLT 218 07/23/2015   NEUTROABS 12.9* 07/23/2015    Lab Results  Component Value Date   NA 138 07/23/2015   K 4.0 07/23/2015   CL 101 07/23/2015   CO2 29 07/23/2015    Studies:  Dg Chest 1 View  07/22/2015  CLINICAL DATA:  Recurrent left upper lobe lung cancer for which the patient is currently being treated, presenting with acutely worsening shortness of breath, cough and chest congestion. EXAM: Portable CHEST 1 VIEW COMPARISON:  CT chest 07/18/2015 and earlier.  PET-CT 06/14/2015. FINDINGS: Suboptimal inspiration. Cardiac silhouette upper normal in size. Dense airspace consolidation in the left upper lobe with air bronchograms, unchanged since the CT 4 days ago. Improved aeration in the right upper lobe, though patchy airspace opacities persist. Consolidation medially in the left lower lobe, unchanged. Small bilateral pleural effusions, unchanged. No  new pulmonary parenchymal abnormalities. Left subclavian Port-A-Cath tip projects at or near the cavoatrial junction. IMPRESSION: Since the CT chest 4 days ago: 1.  Improved aeration in the right upper lobe, though patchy airspace opacities persist indicating residual pneumonia. 2. 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 3. No change in the consolidation in the medial right lower lobe, likely post radiation pneumonitis. 4. No new abnormalities. Electronically Signed   By: Evangeline Dakin M.D.   On: 07/22/2015 19:33    Assessment & Plan:  54 year old female patient with a history of locally advanced left upper lobe lung cancer status post concurrent chemoradiation therapy currently admitted to the hospital for worsening shortness of breath/cough  # Bilateral-left upper lobe and right lower lobe consolidation/pneumonia- 3L of oxygen saturating at 90-94%- continued mild improvement noted. Continue IV vancomycin and Zosyn. Continue breathing treatments.  Pulmonary has been following. We'll also consult infectious disease.  # Isolated left axillary DVT-new-on Lovenox, swelling of left upper extremity improving.  # Elevated blood sugars- on steroids; currently off steroids. Continue with glucose monitoring.  # Locally advanced left upper lobe lung cancer/Stage III status post chemoradiation [finished Aug 10th, 2016]- clinically less likely progression of cancer. ID is recommending bronchoscopy for biopsies and cultures sooner rather than later. We'll discuss this with Dr. Rogue Bussing as well as Dr. Chancy Milroy.  # History of breast cancer. Currently on letrozole by mouth.  Dr. Rogue Bussing was available for consultation and review of plan of care for this patient.[email or web address]   Evlyn Kanner, NP 07/23/2015  3:24 PM

## 2015-07-24 ENCOUNTER — Encounter: Admission: AD | Disposition: E | Payer: Self-pay | Source: Ambulatory Visit | Attending: Internal Medicine

## 2015-07-24 ENCOUNTER — Encounter: Payer: Self-pay | Admitting: *Deleted

## 2015-07-24 HISTORY — PX: FLEXIBLE BRONCHOSCOPY: SHX5094

## 2015-07-24 LAB — BASIC METABOLIC PANEL
ANION GAP: 6 (ref 5–15)
BUN: 8 mg/dL (ref 6–20)
CALCIUM: 9 mg/dL (ref 8.9–10.3)
CO2: 29 mmol/L (ref 22–32)
Chloride: 104 mmol/L (ref 101–111)
Creatinine, Ser: 0.75 mg/dL (ref 0.44–1.00)
GFR calc Af Amer: >60 mL/min (ref >60)
GFR calc non Af Amer: >60 mL/min (ref >60)
GLUCOSE: 115 mg/dL — AB (ref 65–99)
Potassium: 4 mmol/L (ref 3.5–5.1)
Sodium: 139 mmol/L (ref 135–145)

## 2015-07-24 LAB — CBC WITH DIFFERENTIAL/PLATELET
BASOS ABS: 0 10*3/uL (ref 0–0.1)
Basophils Relative: 0 %
Eosinophils Absolute: 0.4 10*3/uL (ref 0–0.7)
Eosinophils Relative: 3 %
HEMATOCRIT: 31.9 % — AB (ref 35.0–47.0)
Hemoglobin: 10.5 g/dL — ABNORMAL LOW (ref 12.0–16.0)
LYMPHS ABS: 0.6 10*3/uL — AB (ref 1.0–3.6)
LYMPHS PCT: 5 %
MCH: 31.2 pg (ref 26.0–34.0)
MCHC: 33 g/dL (ref 32.0–36.0)
MCV: 94.5 fL (ref 80.0–100.0)
MONO ABS: 0.9 10*3/uL (ref 0.2–0.9)
MONOS PCT: 6 %
NEUTROS ABS: 11.8 10*3/uL — AB (ref 1.4–6.5)
Neutrophils Relative %: 86 %
Platelets: 221 10*3/uL (ref 150–440)
RBC: 3.38 MIL/uL — ABNORMAL LOW (ref 3.80–5.20)
RDW: 15.6 % — AB (ref 11.5–14.5)
WBC: 13.7 10*3/uL — ABNORMAL HIGH (ref 3.6–11.0)

## 2015-07-24 LAB — GLUCOSE, CAPILLARY
GLUCOSE-CAPILLARY: 106 mg/dL — AB (ref 65–99)
GLUCOSE-CAPILLARY: 302 mg/dL — AB (ref 65–99)
GLUCOSE-CAPILLARY: 234 mg/dL — AB (ref 65–99)
Glucose-Capillary: 189 mg/dL — ABNORMAL HIGH (ref 65–99)
Glucose-Capillary: 155 mg/dL — ABNORMAL HIGH (ref 65–99)

## 2015-07-24 LAB — LEGIONELLA PNEUMOPHILA SEROGP 1 UR AG: L. pneumophila Serogp 1 Ur Ag: NEGATIVE

## 2015-07-24 SURGERY — BRONCHOSCOPY, FLEXIBLE
Anesthesia: Moderate Sedation

## 2015-07-24 MED ORDER — FENTANYL CITRATE (PF) 100 MCG/2ML IJ SOLN
INTRAMUSCULAR | Status: AC
  Filled 2015-07-24: qty 4

## 2015-07-24 MED ORDER — FENTANYL CITRATE (PF) 100 MCG/2ML IJ SOLN
INTRAMUSCULAR | Status: AC | PRN
  Administered 2015-07-24 (×2): 50 ug via INTRAVENOUS

## 2015-07-24 MED ORDER — LEVALBUTEROL HCL 1.25 MG/0.5ML IN NEBU
1.2500 mg | INHALATION_SOLUTION | Freq: Once | RESPIRATORY_TRACT | Status: AC
  Administered 2015-07-24: 1.25 mg via RESPIRATORY_TRACT
  Filled 2015-07-24: qty 0.5

## 2015-07-24 MED ORDER — IPRATROPIUM-ALBUTEROL 0.5-2.5 (3) MG/3ML IN SOLN
RESPIRATORY_TRACT | Status: AC
  Administered 2015-07-24: 3 mL
  Filled 2015-07-24: qty 3

## 2015-07-24 MED ORDER — PREDNISONE 20 MG PO TABS
20.0000 mg | ORAL_TABLET | Freq: Two times a day (BID) | ORAL | Status: DC
  Administered 2015-07-25 (×2): 20 mg via ORAL
  Filled 2015-07-24 (×2): qty 1

## 2015-07-24 MED ORDER — MIDAZOLAM HCL 5 MG/5ML IJ SOLN
INTRAMUSCULAR | Status: AC | PRN
  Administered 2015-07-24: 1 mg via INTRAVENOUS
  Administered 2015-07-24: 2 mg via INTRAVENOUS

## 2015-07-24 MED ORDER — BUTAMBEN-TETRACAINE-BENZOCAINE 2-2-14 % EX AERO
1.0000 | INHALATION_SPRAY | Freq: Once | CUTANEOUS | Status: DC
  Filled 2015-07-24: qty 20

## 2015-07-24 MED ORDER — BUTAMBEN-TETRACAINE-BENZOCAINE 2-2-14 % EX AERO
INHALATION_SPRAY | CUTANEOUS | Status: AC
  Filled 2015-07-24: qty 20

## 2015-07-24 MED ORDER — PHENYLEPHRINE HCL 0.25 % NA SOLN
1.0000 | Freq: Four times a day (QID) | NASAL | Status: DC | PRN
  Filled 2015-07-24: qty 15

## 2015-07-24 MED ORDER — METHYLPREDNISOLONE SODIUM SUCC 125 MG IJ SOLR
INTRAMUSCULAR | Status: AC
  Administered 2015-07-24: 125 mg
  Filled 2015-07-24: qty 2

## 2015-07-24 MED ORDER — LIDOCAINE HCL 2 % EX GEL
1.0000 "application " | Freq: Once | CUTANEOUS | Status: DC
  Filled 2015-07-24: qty 5

## 2015-07-24 MED ORDER — MIDAZOLAM HCL 5 MG/5ML IJ SOLN
INTRAMUSCULAR | Status: AC
  Filled 2015-07-24: qty 10

## 2015-07-24 NOTE — Progress Notes (Addendum)
Brittney Lam   DOB:02/11/1961   MV#:[id number]   CC: Shortness of breath cough/pneumonia  Subjective:  Patient had a bronchoscopy this morning; final report is pending. Continues to have intermittent coughing spells. Noted to have pinkish tinged sputum no frank hemoptysis. Patient still is to have of shortness of breath or productive cough.  Patient however gets tachycardic when she gets up and ambulates.   ROS: No nausea no vomiting or diarrhea.  Objective:  Filed Vitals:   07/05/2015 0608 07/23/2015 0745  BP:  114/60  Pulse:  117  Temp: 98.6 F (37 C)   Resp:  29     Intake/Output Summary (Last 24 hours) at 07/29/2015 0806 Last data filed at 07/23/15 1700  Gross per 24 hour  Intake    240 ml  Output     70 ml  Net    170 ml    GENERAL:alert, no distress; having coughing spells. She is on oxygen. She is alone. SKIN: No rash. EYES: normal, Conjunctiva are pink and non-injected, sclera clear OROPHARYNX:no exudate, no erythema and lips, buccal mucosa, and tongue normal  NECK: No abnormality noted LYMPH:  no palpable lymphadenopathy in the cervical, axillary or inguinal LUNGS: Bilateral decreased air entry [however improving]; wheezing noted. HEART: Tachycardic ; regular  rhythm and no murmurs and no lower extremity edema ABDOMEN:abdomen soft, non-tender and normal bowel sounds Musculoskeletal:no cyanosis of digits and no clubbing; left upper extremity swelling compared to the right. Pulses intact NEURO: alert & oriented x 3 with fluent speech, no focal motor/sensory deficits   Labs:  Lab Results  Component Value Date   WBC 13.7* 07/04/2015   HGB 10.5* 07/29/2015   HCT 31.9* 07/09/2015   MCV 94.5 07/04/2015   PLT 221 07/04/2015   NEUTROABS 11.8* 07/16/2015    Lab Results  Component Value Date   NA 139 07/26/2015   K 4.0 07/04/2015   CL 104 07/25/2015   CO2 29 07/02/2015    Studies:  Dg Chest 1 View  07/22/2015  CLINICAL DATA:  Recurrent left upper lobe lung  cancer for which the patient is currently being treated, presenting with acutely worsening shortness of breath, cough and chest congestion. EXAM: Portable CHEST 1 VIEW COMPARISON:  CT chest 07/18/2015 and earlier.  PET-CT 06/14/2015. FINDINGS: Suboptimal inspiration. Cardiac silhouette upper normal in size. Dense airspace consolidation in the left upper lobe with air bronchograms, unchanged since the CT 4 days ago. Improved aeration in the right upper lobe, though patchy airspace opacities persist. Consolidation medially in the left lower lobe, unchanged. Small bilateral pleural effusions, unchanged. No new pulmonary parenchymal abnormalities. Left subclavian Port-A-Cath tip projects at or near the cavoatrial junction. IMPRESSION: Since the CT chest 4 days ago: 1. Improved aeration in the right upper lobe, though patchy airspace opacities persist indicating residual pneumonia. 2. 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 3. No change in the consolidation in the medial right lower lobe, likely post radiation pneumonitis. 4. No new abnormalities. Electronically Signed   By: Evangeline Dakin M.D.   On: 07/22/2015 19:33    Assessment & Plan:  54 year old female patient with a history of locally advanced left upper lobe lung cancer status post concurrent chemoradiation therapy:  # Bilateral-left upper lobe and right lower lobe consolidation/pneumonia- 2-3 3 L of oxygen saturating at 90-94%-; no significant improvement noted in the last few days. Bronchoscopy this morning. Continue antibiotics. Appreciate infectious disease/pulmonary  recommendations. I'll check with Dr. Delton Prairie regarding reinstituting steroids given the wheezing.  # Isolated left axillary DVT-new-on Lovenox  # Locally advanced left upper lobe lung cancer/Stage III status post chemoradiation [finished finished Aug 10th, 2016]- clinically less  likely progression of cancer  #History of breast cancer  # Elevated blood sugars- on steroids; currently off steroids  Above plan of care was discussed with the patient's nurse/patient in detail.    Cammie Sickle, MD 07/11/2015  8:06 AM   Addendum: # Spoke to Dr. Humphrey Rolls pulmonology; for now recommend continued antibiotics. If no improvement then; he might consider doing a transbronchial/or EBUS with  biopsy.   # Also agrees a trial of low-dose steroids prednisone-we will start tomorrow. Patient received one dose of Solu-Medrol with Dr. Humphrey Rolls this morning.

## 2015-07-24 NOTE — Plan of Care (Signed)
Problem: Education: Goal: Knowledge of [facility] Education information/materials will improve Outcome: Progressing Pt is independent in the room, calls for assistance when needed. Pt is NPO after midnight for possible procedure today. Continue on antibiotic treatment and breathing treatments.  Problem: Respiratory: Goal: Respiratory status will improve Outcome: Progressing Pt remains on 3 L of oxygen. Pt is tachycardic on telemetry.  Goal: Pain level will decrease Outcome: Progressing Pt has no complaints of pain at this time. Continue to assess.

## 2015-07-24 NOTE — Progress Notes (Signed)
[place] [facility] INFECTIOUS DISEASE PROGRESS NOTE Date of Admission:  07/26/2015     ID: Brittney Lam is a 54 y.o. female with  Lung cancer and diffuse PNA  Active Problems:   Pneumonia   Left upper lobe pneumonia  Subjective: No fevers, on 2 L O2. Had bronch. Reports less wheezing  ROS  Eleven systems are reviewed and negative except per hpi  Medications:  Antibiotics Given (last 72 hours)    Date/Time Action Medication Dose Rate   07/21/15 1705 Given   piperacillin-tazobactam (ZOSYN) IVPB 3.375 g 3.375 g 12.5 mL/hr   07/21/15 2127 Given  [pt preference]   vancomycin (VANCOCIN) IVPB 1000 mg/200 mL premix 1,000 mg 200 mL/hr   07/21/15 2341 Given   piperacillin-tazobactam (ZOSYN) IVPB 3.375 g 3.375 g 12.5 mL/hr   07/22/15 0810 Given   piperacillin-tazobactam (ZOSYN) IVPB 3.375 g 3.375 g 12.5 mL/hr   07/22/15 0811 Given   vancomycin (VANCOCIN) IVPB 1000 mg/200 mL premix 1,000 mg 200 mL/hr   07/22/15 1713 Given   piperacillin-tazobactam (ZOSYN) IVPB 3.375 g 3.375 g 12.5 mL/hr   07/22/15 2139 Given   vancomycin (VANCOCIN) IVPB 1000 mg/200 mL premix 1,000 mg 200 mL/hr   07/23/15 0035 Given   piperacillin-tazobactam (ZOSYN) IVPB 3.375 g 3.375 g 12.5 mL/hr   07/23/15 0035 Given   vancomycin (VANCOCIN) 500 mg in sodium chloride 0.9 % 100 mL IVPB 500 mg 100 mL/hr   07/23/15 0803 Given   piperacillin-tazobactam (ZOSYN) IVPB 3.375 g 3.375 g 12.5 mL/hr   07/23/15 1545 Given   azithromycin (ZITHROMAX) tablet 500 mg 500 mg    07/23/15 1546 Given   piperacillin-tazobactam (ZOSYN) IVPB 3.375 g 3.375 g 12.5 mL/hr   07/23/15 2139 Given   vancomycin (VANCOCIN) 1,500 mg in sodium chloride 0.9 % 500 mL IVPB 1,500 mg 250 mL/hr   07/12/2015 0009 Given   piperacillin-tazobactam (ZOSYN) IVPB 3.375 g 3.375 g 12.5 mL/hr   07/23/2015 1111 Given   piperacillin-tazobactam (ZOSYN) IVPB 3.375 g 3.375 g 12.5 mL/hr   07/03/2015 1111 Given   vancomycin (VANCOCIN) 1,500 mg in sodium chloride 0.9 % 500 mL  IVPB 1,500 mg 250 mL/hr   07/05/2015 1115 Given   azithromycin (ZITHROMAX) tablet 250 mg 250 mg      . azithromycin  250 mg Oral Daily  . butamben-tetracaine-benzocaine      . butamben-tetracaine-benzocaine  1 spray Topical Once  . calcium citrate-vitamin D  1 tablet Oral BID  . enoxaparin (LOVENOX) injection  1.5 mg/kg Subcutaneous Q24H  . fentaNYL      . glipiZIDE  5 mg Oral Q breakfast  . hydrochlorothiazide  12.5 mg Oral Daily  . insulin aspart  0-15 Units Subcutaneous TID WC  . insulin aspart  0-5 Units Subcutaneous QHS  . ipratropium-albuterol  3 mL Nebulization Q6H  . letrozole  2.5 mg Oral Daily  . lidocaine  1 application Topical Once  . midazolam      . pantoprazole  40 mg Oral Daily  . PARoxetine  10 mg Oral QHS  . piperacillin-tazobactam (ZOSYN)  IV  3.375 g Intravenous 3 times per day  . potassium chloride SA  20 mEq Oral Daily  . simvastatin  10 mg Oral Daily  . vancomycin  1,500 mg Intravenous Q12H    Objective: Vital signs in last 24 hours: Temp:  [98.4 F (36.9 C)-98.6 F (37 C)] 98.4 F (36.9 C) (11/23 1103) Pulse Rate:  [111-138] 120 (11/23 1103) Resp:  [20-37] 20 (11/23 1103) BP: (  90-118)/(49-79) 104/64 mmHg (11/23 1103) SpO2:  [85 %-99 %] 93 % (11/23 1103) Constitutional: oriented to person, place, and time. Dyspneic HENT: [place]/AT, PERRLA, no scleral icterus Mouth/Throat: Oropharynx is clear and moist. No oropharyngeal exudate.  Cardiovascular: tachy Pulmonary/Chest: bronchial breath sounds Lul, rhonchi throughout  Neck = supple, no nuchal rigidity Abdominal: Soft. Bowel sounds are normal. exhibits no distension. There is no tenderness.  Lymphadenopathy: no cervical adenopathy. No axillary adenopathy Neurological: alert and oriented to person, place, and time.  Skin: Skin is warm and dry. No rash noted. No erythema.  Psychiatric: a normal mood and affect. behavior is normal.  Access portacath L chest wall wnl  Lab Results  Recent Labs   07/23/15 0505 07/28/2015 0452  WBC 15.2* 13.7*  HGB 11.2* 10.5*  HCT 33.9* 31.9*  NA 138 139  K 4.0 4.0  CL 101 104  CO2 29 29  BUN 9 8  CREATININE 0.76 0.75    Microbiology:  Results for orders placed or performed during the hospital encounter of 07/03/2015  Culture, sputum-assessment     Status: None   Collection Time: 07/20/15  7:33 AM  Result Value Ref Range Status   Specimen Description EXPECTORATED SPUTUM  Final   Special Requests NONE  Final   Sputum evaluation   Final    Sputum specimen not acceptable for testing.  Please recollect.   Results Called to: [facility] PARDINI AT 4008 ON 07/20/15 CTJ    Report Status 07/20/2015 FINAL  Final    Studies/Results: Dg Chest 1 View  07/22/2015  CLINICAL DATA:  Recurrent left upper lobe lung cancer for which the patient is currently being treated, presenting with acutely worsening shortness of breath, cough and chest congestion. EXAM: Portable CHEST 1 VIEW COMPARISON:  CT chest 07/18/2015 and earlier.  PET-CT 06/14/2015. FINDINGS: Suboptimal inspiration. Cardiac silhouette upper normal in size. Dense airspace consolidation in the left upper lobe with air bronchograms, unchanged since the CT 4 days ago. Improved aeration in the right upper lobe, though patchy airspace opacities persist. Consolidation medially in the left lower lobe, unchanged. Small bilateral pleural effusions, unchanged. No new pulmonary parenchymal abnormalities. Left subclavian Port-A-Cath tip projects at or near the cavoatrial junction. IMPRESSION: Since the CT chest 4 days ago: 1. Improved aeration in the right upper lobe, though patchy airspace opacities persist indicating residual pneumonia. 2. 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 3. No change in the consolidation in the medial right lower lobe, likely post radiation pneumonitis. 4. No new abnormalities. Electronically  Signed   By: Evangeline Dakin M.D.   On: 07/22/2015 19:33    Assessment/Plan: Brittney Lam is a 53 y.o. female with lung cancer s/p chemoradiation with cough, sob, hemoptysis. CT shows extensive infiltrate and consolidation. Unable to produce sputum.  Interestingly she had a process noted in CT PET Oct 14th. This has clearly progressed.  I see mention of her being started on prednisone in Oct 14th onc visit for possible radiation pneumonitis. Had dry hacking cough at that time.  I suspect radiation pneumonitis but bacterial, atypical, fungal or mycobacterial infection could also be present. She had bronch done today with scant secretions, erythema noted.   Recommendations    Cont vanco, zosyn azithro Follow cultures and legioneala antigen Further abx based on cxr results.  If evidence radiation pneumonitis can stop abx and treat with steroids Thank you very much for the consult. Will follow with  you.  Danae Oland   07/28/2015, 1:35 PM

## 2015-07-24 NOTE — Op Note (Signed)
[facility] Patient Name: Aristea Posada Procedure Date: 07/08/2015 8:31 AM MRN: [id number] Account #: [id number] Date of Birth: May 14, 1961 Admit Type: Inpatient Age: 54 Room: [place] Suite on 2nd floor Note Status: Finalized Attending MD: Allyne Gee, MD Procedure:         Bronchoscopy Indications:       Bilateral atelectasis, Known lung cancer Providers:         Allyne Gee, MD, Annia Belt, Admin. Environmental education officer) Referring MD:       Medicines:         Midazolam 3 mg mg IV, Fentanyl 151 mcg IV Complications:     No immediate complications Procedure:         Pre-Anesthesia Assessment:                    - A History and Physical has been performed. The patient's                     medications, allergies and sensitivities have been                     reviewed.                    - The risks and benefits of the procedure and the sedation                     options and risks were discussed with the patient. All                     questions were answered and informed consent was obtained.                    - Pre-procedure physical examination revealed no                     contraindications to sedation.                    - ASA Grade Assessment: III - A patient with severe                     systemic disease.                    - After reviewing the risks and benefits, the patient was                     deemed in satisfactory condition to undergo the procedure.                    - The anesthesia plan was to use moderate                     sedation/analgesia.                    - Immediately prior to administration of medications, the                     patient was re-assessed for adequacy to receive sedatives.                    - The heart rate, respiratory rate, oxygen saturations,                     blood pressure, adequacy of pulmonary  ventilation, and                     response to care were monitored throughout the procedure.  - The physical status of the patient was re-assessed after                     the procedure.                    After obtaining informed consent, the bronchoscope was                     passed under direct vision. Throughout the procedure, the                     patient's blood pressure, pulse, and oxygen saturations                     were monitored continuously. the Bronchoscope Olympus                     BF-Q180 S# [id number] was introduced through the right                     nostril and advanced to the tracheobronchial tree of both                     lungs. The procedure was accomplished without difficulty.                     The patient tolerated the procedure well. The total                     duration of the procedure was 18 minutes. Findings:      The nasopharynx/oropharynx appears normal. The larynx appears normal.       The vocal cords are normal and move normally with phonation and       breathing. The subglottic space is normal. The trachea is of normal       caliber. The carina is sharp. The tracheobronchial tree of the left lung       was examined to at least the first subsegmental level. Bronchial mucosa       and anatomy in the left lung are normal; there are no endobronchial       lesions, and no secretions.      Right Lung Abnormalities: Evidence of RUL resection. Erythema was found       throughout the right tracheobronchial tree. Scant secretions were found       throughout the right tracheobronchial tree. They were not obstructing       the airway. Washings were obtained in the right middle lobe and in the       right lower lobe and sent for cell count, bacterial culture, viral       smears & culture, and fungal & AFB analysis and cytology. The return was       cellular and cloudy. Impression:        - Known lung cancer                    - The left lung was normal.                    - Erythema was present throughout the tracheobronchial  tree.                    - Scant secretions were found throughout the                     tracheobronchial tree.                    - Washings were obtained. Recommendation:    - Await culture, cytology and washing results. Devona Konig, MD Allyne Gee, MD 07/05/2015 12:23:52 PM This report has been signed electronically. Number of Addenda: 0 Note Initiated On: 07/18/2015 8:31 AM      [facility]

## 2015-07-24 NOTE — Plan of Care (Addendum)
Problem: Respiratory: Goal: Respiratory status will improve Outcome: Progressing Patient had bronchoscopy today. Remains on O2. Per specials RN, xoponex given PRN for bronchospasm in recovery IVF and IV ABX infusing. MD aware of HR increasing to 150s when walking. Per MD, probably just stress from PNA. No interventions at this time.  Goal: Ability to maintain a clear airway will improve Outcome: Progressing Patients cough is now dry and non productive.  Patients lungs are wheezy throughout. Encouraging sitting up in chair. Breathing tx scheduled.    Goal: Pain level will decrease Outcome: Progressing Patient c/o pain in right shoulder. Norco given PRN for pain, patient stated relief.  Goal: Identification of resources available to assist in meeting health care needs will improve Outcome: Completed/Met Date Met:  07/05/2015 Care mgmt consulted.

## 2015-07-24 NOTE — Progress Notes (Signed)
Patient tolerated bronchoscopy well. No endobronchial lesions. Washings done with cloudy return from the right lung. Sent for cultures and gr stain. Please see full bronch procedure note

## 2015-07-24 NOTE — Progress Notes (Signed)
ANTIBIOTIC CONSULT NOTE - INITIAL  Pharmacy Consult for Zosyn/Vancomycin Dosing (Day 3) Indication: rule out pneumonia  Allergies  Allergen Reactions  . Metformin Diarrhea and Nausea Only  . Other Rash    Sage Wipes    Patient Measurements: Height: '5\' 2"'$  (157.5 cm) Weight: 144 lb (65.318 kg) IBW/kg (Calculated) : 50.1 Adjusted Body Weight: 56 kg  Vital Signs: Temp: 98.4 F (36.9 C) (11/23 1103) Temp Source: Oral (11/23 1103) BP: 104/64 mmHg (11/23 1103) Pulse Rate: 120 (11/23 1103) Intake/Output from previous day: 11/22 0701 - 11/23 0700 In: 240 [P.O.:240] Out: 70 [Urine:70] Intake/Output from this shift:    Labs:  Recent Labs  07/22/15 0623 07/22/15 0850 07/23/15 0505 07/28/2015 0452  WBC 14.1*  --  15.2* 13.7*  HGB 11.2*  --  11.2* 10.5*  PLT 229  --  218 221  CREATININE  --  0.80 0.76 0.75   Estimated Creatinine Clearance: 70.5 mL/min (by C-G formula based on Cr of 0.75).  Recent Labs  07/22/15 2037  [facility] 10     Microbiology: Recent Results (from the past 720 hour(s))  Culture, sputum-assessment     Status: None   Collection Time: 07/20/15  7:33 AM  Result Value Ref Range Status   Specimen Description EXPECTORATED SPUTUM  Final   Special Requests NONE  Final   Sputum evaluation   Final    Sputum specimen not acceptable for testing.  Please recollect.   Results Called to: ERIN PARDINI AT 7035 ON 07/20/15 CTJ    Report Status 07/20/2015 FINAL  Final    Assessment: Pharmacy consulted to dose vancomycin and Zosyn for 54 yo female being treated for left upper lobe PNA. ID consulted, azithromycin added 11/22.  On day 6 of vancomycin/zosyn  Goal of Therapy:  Vancomycin trough level 15-20 mcg/ml  Plan:  Continue zosyn 3.375gm IV Q8H extended infusion  Current orders for vancomycin '1500mg'$  IV Q12H. Renal function stable. Will check trough prior to 4th dose of this regimen, which should be at approaching steady state.   Pharmacy to follow per  consult  Rexene Edison, PharmD Clinical Pharmacist  07/16/2015,12:02 PM

## 2015-07-25 DIAGNOSIS — C3431 Malignant neoplasm of lower lobe, right bronchus or lung: Secondary | ICD-10-CM | POA: Diagnosis not present

## 2015-07-25 DIAGNOSIS — J189 Pneumonia, unspecified organism: Secondary | ICD-10-CM | POA: Diagnosis not present

## 2015-07-25 DIAGNOSIS — C3412 Malignant neoplasm of upper lobe, left bronchus or lung: Secondary | ICD-10-CM | POA: Diagnosis not present

## 2015-07-25 DIAGNOSIS — J90 Pleural effusion, not elsewhere classified: Secondary | ICD-10-CM | POA: Diagnosis not present

## 2015-07-25 DIAGNOSIS — J15212 Pneumonia due to Methicillin resistant Staphylococcus aureus: Secondary | ICD-10-CM | POA: Diagnosis not present

## 2015-07-25 DIAGNOSIS — J700 Acute pulmonary manifestations due to radiation: Secondary | ICD-10-CM

## 2015-07-25 LAB — BASIC METABOLIC PANEL
Anion gap: 8 (ref 5–15)
BUN: 12 mg/dL (ref 6–20)
CALCIUM: 9.3 mg/dL (ref 8.9–10.3)
CHLORIDE: 107 mmol/L (ref 101–111)
CO2: 25 mmol/L (ref 22–32)
CREATININE: 0.83 mg/dL (ref 0.44–1.00)
GFR calc non Af Amer: >60 mL/min (ref >60)
Glucose, Bld: 143 mg/dL — ABNORMAL HIGH (ref 65–99)
Potassium: 3.7 mmol/L (ref 3.5–5.1)
SODIUM: 140 mmol/L (ref 135–145)

## 2015-07-25 LAB — CBC WITH DIFFERENTIAL/PLATELET
BASOS PCT: 1 %
Basophils Absolute: 0.1 10*3/uL (ref 0–0.1)
EOS ABS: 0 10*3/uL (ref 0–0.7)
Eosinophils Relative: 0 %
HCT: 31.8 % — ABNORMAL LOW (ref 35.0–47.0)
HEMOGLOBIN: 10.2 g/dL — AB (ref 12.0–16.0)
LYMPHS ABS: 0.5 10*3/uL — AB (ref 1.0–3.6)
Lymphocytes Relative: 2 %
MCH: 30.1 pg (ref 26.0–34.0)
MCHC: 32.1 g/dL (ref 32.0–36.0)
MCV: 93.7 fL (ref 80.0–100.0)
MONO ABS: 1.1 10*3/uL — AB (ref 0.2–0.9)
MONOS PCT: 6 %
Neutro Abs: 17.9 10*3/uL — ABNORMAL HIGH (ref 1.4–6.5)
Neutrophils Relative %: 91 %
Platelets: 230 10*3/uL (ref 150–440)
RBC: 3.39 MIL/uL — ABNORMAL LOW (ref 3.80–5.20)
RDW: 15.5 % — AB (ref 11.5–14.5)
WBC: 19.6 10*3/uL — ABNORMAL HIGH (ref 3.6–11.0)

## 2015-07-25 LAB — GLUCOSE, CAPILLARY
GLUCOSE-CAPILLARY: 107 mg/dL — AB (ref 65–99)
GLUCOSE-CAPILLARY: 140 mg/dL — AB (ref 65–99)
Glucose-Capillary: 204 mg/dL — ABNORMAL HIGH (ref 65–99)
Glucose-Capillary: 229 mg/dL — ABNORMAL HIGH (ref 65–99)

## 2015-07-25 LAB — VANCOMYCIN, TROUGH: Vancomycin Tr: 14 ug/mL (ref 10–20)

## 2015-07-25 MED ORDER — VANCOMYCIN HCL IN DEXTROSE 1-5 GM/200ML-% IV SOLN
1000.0000 mg | Freq: Three times a day (TID) | INTRAVENOUS | Status: DC
  Administered 2015-07-25 – 2015-07-26 (×3): 1000 mg via INTRAVENOUS
  Filled 2015-07-25 (×6): qty 200

## 2015-07-25 NOTE — Plan of Care (Signed)
Problem: Respiratory: Goal: Respiratory status will improve Outcome: Progressing Pt continues using 2 L [place] at this time.  Pt remains independent in room, calls when assistance is needed.  IVF and antibiotics continued. Remains tachycardic on telemetry.  Goal: Pain level will decrease Outcome: Progressing No c/o of pain at this time, continue to monitor.

## 2015-07-25 NOTE — Progress Notes (Signed)
ANTIBIOTIC CONSULT NOTE - INITIAL  Pharmacy Consult for Zosyn/Vancomycin Dosing (Day 3) Indication: rule out pneumonia  Allergies  Allergen Reactions  . Metformin Diarrhea and Nausea Only  . Other Rash    Sage Wipes    Patient Measurements: Height: '5\' 2"'$  (157.5 cm) Weight: 144 lb (65.318 kg) IBW/kg (Calculated) : 50.1 Adjusted Body Weight: 56 kg  Vital Signs: Temp: 98.6 F (37 C) (11/23 2111) Temp Source: Oral (11/23 2111) BP: 107/53 mmHg (11/23 2111) Pulse Rate: 125 (11/23 2111) Intake/Output from previous day:   Intake/Output from this shift:    Labs:  Recent Labs  07/22/15 0623 07/22/15 0850 07/23/15 0505 07/02/2015 0452  WBC 14.1*  --  15.2* 13.7*  HGB 11.2*  --  11.2* 10.5*  PLT 229  --  218 221  CREATININE  --  0.80 0.76 0.75   Estimated Creatinine Clearance: 70.5 mL/min (by C-G formula based on Cr of 0.75).  Recent Labs  07/22/15 2037 07/23/2015 2213  [place] 10 14     Microbiology: Recent Results (from the past 720 hour(s))  Culture, sputum-assessment     Status: None   Collection Time: 07/20/15  7:33 AM  Result Value Ref Range Status   Specimen Description EXPECTORATED SPUTUM  Final   Special Requests NONE  Final   Sputum evaluation   Final    Sputum specimen not acceptable for testing.  Please recollect.   Results Called to: ERIN PARDINI AT 6599 ON 07/20/15 CTJ    Report Status 07/20/2015 FINAL  Final    Assessment: Pharmacy consulted to dose vancomycin and Zosyn for 54 yo female being treated for left upper lobe PNA. ID consulted, azithromycin added 11/22.  On day 6 of vancomycin/zosyn  Goal of Therapy:  Vancomycin trough level 15-20 mcg/ml  Plan:  1123 2213 vancomycin trough subtherapeutic. Recalculated Ke 0.097 hr-1. Change to 1 gm IV Q8H. Predicted trough 15 to 20 mcg/mL. Pharmacy will continue to follow and adjust as needed to maintain trough 15 to 20 mcg/mL.  Brenetta Penny A. Ages, [place].D., BCPS Clinical Pharmacist   07/25/2015,2:01 AM

## 2015-07-25 NOTE — Progress Notes (Signed)
Discussed with MD patients desats overnight and HR elevated to 140s. No interventions at this time, but notify her if there is any arrhythmia.

## 2015-07-25 NOTE — Plan of Care (Signed)
Problem: Respiratory: Goal: Respiratory status will improve Outcome: Progressing Patient on 6L humidified O2. Desats to 80s after walking from [place]. MD aware. Encouraged patient to rest. Sitting up in chair for the majority of the day. IVF and IV ABX infusing. Goal: Ability to maintain a clear airway will improve Outcome: Progressing Receiving duonebs. Patient continues to have dry non productive cough. HR ranging from high SR 90s to ST 140s at times. MD aware. No interventions at this time. Goal: Pain level will decrease Outcome: Progressing No c/o pain this shift.

## 2015-07-25 NOTE — Progress Notes (Signed)
[name] Hematology/Oncology Progress Note  Date of admission: 07/18/2015  Hospital day:  07/25/2015  Chief Complaint: Brittney Lam is a 54 y.o. female with locally advanced LUL lung cancer who was admitted with left upper lobe and right lower lobe consolidation.  Subjective: Short of breath with any activity.  Breathing a little worse since bronchoscopy yesterday.  Social History: The patient is accompanied by her niece, Brittney Lam, today.  Allergies:  Allergies  Allergen Reactions  . Metformin Diarrhea and Nausea Only  . Other Rash    Sage Wipes    Scheduled Medications: . azithromycin  250 mg Oral Daily  . butamben-tetracaine-benzocaine  1 spray Topical Once  . calcium citrate-vitamin D  1 tablet Oral BID  . enoxaparin (LOVENOX) injection  1.5 mg/kg Subcutaneous Q24H  . glipiZIDE  5 mg Oral Q breakfast  . hydrochlorothiazide  12.5 mg Oral Daily  . insulin aspart  0-15 Units Subcutaneous TID WC  . insulin aspart  0-5 Units Subcutaneous QHS  . ipratropium-albuterol  3 mL Nebulization Q6H  . letrozole  2.5 mg Oral Daily  . lidocaine  1 application Topical Once  . pantoprazole  40 mg Oral Daily  . PARoxetine  10 mg Oral QHS  . piperacillin-tazobactam (ZOSYN)  IV  3.375 g Intravenous 3 times per day  . potassium chloride SA  20 mEq Oral Daily  . predniSONE  20 mg Oral BID WC  . simvastatin  10 mg Oral Daily  . vancomycin  1,000 mg Intravenous Q8H    Review of Systems: GENERAL:  Fatigue.  No fevers or sweats. PERFORMANCE STATUS (ECOG):  3 HEENT:  No visual changes, runny nose, sore throat, mouth sores or tenderness. Lungs: Shortness of breath.  Cough.  Wheezing.  No hemoptysis. Cardiac:  No chest pain, palpitations, orthopnea, or PND. GI:  No nausea, vomiting, diarrhea, constipation, melena or hematochezia. GU:  No urgency, frequency, dysuria, or hematuria. Musculoskeletal:  No back pain.  No joint pain.  No muscle tenderness. Extremities:  No  pain or swelling. Skin:  No rashes or skin changes. Neuro:  Generalized weakness.  No headache, numbness or weakness, balance or coordination issues. Endocrine:  No diabetes, thyroid issues, hot flashes or night sweats. Psych:  No mood changes, depression or anxiety. Pain:  No focal pain. Review of systems:  All other systems reviewed and found to be negative.  Physical Exam: Blood pressure 122/74, pulse 105, temperature 98.7 F (37.1 C), temperature source Oral, resp. rate 18, height '5\' 2"'  (1.575 m), weight 144 lb (65.318 kg), SpO2 92 %.  GENERAL:  Well developed, well nourished, sitting comfortably up getting a sponge bath on the medical unit in no acute distress. MENTAL STATUS:  Alert and oriented to person, place and time. HEAD:  Wearing a head wrap. Normocephalic, atraumatic, face symmetric, no Cushingoid features. EYES:  Brown eyes.  Pupils equal round and reactive to light and accomodation.  No conjunctivitis or scleral icterus. ENT:  [place] in place.  Oropharynx clear without lesion.  Tongue normal. Mucous membranes moist.  RESPIRATORY:  Diffuse wheezes.  No rhonchi. CARDIOVASCULAR:  Regular rate and rhythm without murmur, rub or gallop. ABDOMEN:  Soft, non-tender, with active bowel sounds, and no appreciable hepatosplenomegaly.  No masses. SKIN:  No rashes, ulcers or lesions. EXTREMITIES: Mild left upper extremity edema.  No lower extremity edema, no skin discoloration or tenderness.  No palpable cords. LYMPH NODES: No palpable cervical, supraclavicular, axillary or inguinal adenopathy  NEUROLOGICAL: Unremarkable. PSYCH:  Appropriate.  Results for orders placed or performed during the hospital encounter of 07/06/2015 (from the past 48 hour(s))  Glucose, capillary     Status: Abnormal   Collection Time: 07/23/15 11:31 AM  Result Value Ref Range   Glucose-Capillary 114 (H) 65 - 99 mg/dL  Legionella Pneumophila Serogp 1 Ur Ag     Status: None   Collection Time: 07/23/15  3:09 PM   Result Value Ref Range   L. pneumophila Serogp 1 Ur Ag Negative Negative    Comment: (NOTE) Performed At: [facility] [place], [place] [id number] Lindon Romp MD [id number]    Source of Sample URINE, RANDOM   Glucose, capillary     Status: None   Collection Time: 07/23/15  4:27 PM  Result Value Ref Range   Glucose-Capillary 91 65 - 99 mg/dL  Glucose, capillary     Status: Abnormal   Collection Time: 07/23/15  9:09 PM  Result Value Ref Range   Glucose-Capillary 217 (H) 65 - 99 mg/dL   Comment 1 Notify RN   CBC with Differential     Status: Abnormal   Collection Time: 07/16/2015  4:52 AM  Result Value Ref Range   WBC 13.7 (H) 3.6 - 11.0 K/uL   RBC 3.38 (L) 3.80 - 5.20 MIL/uL   Hemoglobin 10.5 (L) 12.0 - 16.0 g/dL   HCT 31.9 (L) 35.0 - 47.0 %   MCV 94.5 80.0 - 100.0 fL   MCH 31.2 26.0 - 34.0 pg   MCHC 33.0 32.0 - 36.0 g/dL   RDW 15.6 (H) 11.5 - 14.5 %   Platelets 221 150 - 440 K/uL   Neutrophils Relative % 86 %   Neutro Abs 11.8 (H) 1.4 - 6.5 K/uL   Lymphocytes Relative 5 %   Lymphs Abs 0.6 (L) 1.0 - 3.6 K/uL   Monocytes Relative 6 %   Monocytes Absolute 0.9 0.2 - 0.9 K/uL   Eosinophils Relative 3 %   Eosinophils Absolute 0.4 0 - 0.7 K/uL   Basophils Relative 0 %   Basophils Absolute 0.0 0 - 0.1 K/uL  Basic metabolic panel     Status: Abnormal   Collection Time: 07/16/2015  4:52 AM  Result Value Ref Range   Sodium 139 135 - 145 mmol/L   Potassium 4.0 3.5 - 5.1 mmol/L   Chloride 104 101 - 111 mmol/L   CO2 29 22 - 32 mmol/L   Glucose, Bld 115 (H) 65 - 99 mg/dL   BUN 8 6 - 20 mg/dL   Creatinine, Ser 0.75 0.44 - 1.00 mg/dL   Calcium 9.0 8.9 - 10.3 mg/dL   GFR calc non Af Amer >60 >60 mL/min   GFR calc Af Amer >60 >60 mL/min    Comment: (NOTE) The eGFR has been calculated using the CKD EPI equation. This calculation has not been validated in all clinical situations. eGFR's persistently <60 mL/min signify possible Chronic Kidney Disease.     Anion gap 6 5 - 15  CBC with Differential     Status: Abnormal   Collection Time: 07/22/2015  6:09 AM  Result Value Ref Range   WBC 19.6 (H) 3.6 - 11.0 K/uL   RBC 3.39 (L) 3.80 - 5.20 MIL/uL   Hemoglobin 10.2 (L) 12.0 - 16.0 g/dL   HCT 31.8 (L) 35.0 - 47.0 %   MCV 93.7 80.0 - 100.0 fL   MCH 30.1 26.0 - 34.0 pg   MCHC 32.1 32.0 - 36.0 g/dL  RDW 15.5 (H) 11.5 - 14.5 %   Platelets 230 150 - 440 K/uL   Neutrophils Relative % 91 %   Neutro Abs 17.9 (H) 1.4 - 6.5 K/uL   Lymphocytes Relative 2 %   Lymphs Abs 0.5 (L) 1.0 - 3.6 K/uL   Monocytes Relative 6 %   Monocytes Absolute 1.1 (H) 0.2 - 0.9 K/uL   Eosinophils Relative 0 %   Eosinophils Absolute 0.0 0 - 0.7 K/uL   Basophils Relative 1 %   Basophils Absolute 0.1 0 - 0.1 K/uL  Basic metabolic panel     Status: Abnormal   Collection Time: 07/07/2015  6:09 AM  Result Value Ref Range   Sodium 140 135 - 145 mmol/L   Potassium 3.7 3.5 - 5.1 mmol/L   Chloride 107 101 - 111 mmol/L   CO2 25 22 - 32 mmol/L   Glucose, Bld 143 (H) 65 - 99 mg/dL   BUN 12 6 - 20 mg/dL   Creatinine, Ser 0.83 0.44 - 1.00 mg/dL   Calcium 9.3 8.9 - 10.3 mg/dL   GFR calc non Af Amer >60 >60 mL/min   GFR calc Af Amer >60 >60 mL/min    Comment: (NOTE) The eGFR has been calculated using the CKD EPI equation. This calculation has not been validated in all clinical situations. eGFR's persistently <60 mL/min signify possible Chronic Kidney Disease.    Anion gap 8 5 - 15  Glucose, capillary     Status: Abnormal   Collection Time: 07/31/2015  7:30 AM  Result Value Ref Range   Glucose-Capillary 106 (H) 65 - 99 mg/dL   Comment 1 Notify RN   Culture, respiratory (NON-Expectorated)     Status: None (Preliminary result)   Collection Time: 07/04/2015  9:58 AM  Result Value Ref Range   Specimen Description BRONCHIAL WASHINGS    Special Requests WASHING RIGHT    Gram Stain PENDING    Culture NO GROWTH < 24 HOURS    Report Status PENDING   Glucose, capillary     Status:  Abnormal   Collection Time: 07/06/2015 11:53 AM  Result Value Ref Range   Glucose-Capillary 155 (H) 65 - 99 mg/dL   Comment 1 Notify RN   Glucose, capillary     Status: Abnormal   Collection Time: 07/07/2015  5:16 PM  Result Value Ref Range   Glucose-Capillary 302 (H) 65 - 99 mg/dL   Comment 1 Notify RN   Glucose, capillary     Status: Abnormal   Collection Time: 07/17/2015  9:15 PM  Result Value Ref Range   Glucose-Capillary 234 (H) 65 - 99 mg/dL   Comment 1 Notify RN   Vancomycin, trough     Status: None   Collection Time: 07/17/2015 10:13 PM  Result Value Ref Range   Vancomycin Tr 14 10 - 20 ug/mL  Glucose, capillary     Status: Abnormal   Collection Time: 07/25/15  7:29 AM  Result Value Ref Range   Glucose-Capillary 107 (H) 65 - 99 mg/dL   No results found.  Assessment:  Brittney Lam is a 54 y.o. female with locally advanced  LUL lung cancer s/p concurrent chemotherapy and radiation admitted with left upper lobe and right lower lobe consolidation.  She has been on broad spectrum antibiotics.  She underwent bronchoscopy yesterday.  Samples negative to date.  Steroids added today.  Plan: 1. Hematology/Oncology:   Initial T1bN0M0 RUL carcinoma s/p RUL lobectomy.  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  Completed 6 cycles of carbo/Taxol with radiation on 04/10/2015.   No current evidence of progressive disease.    History of stage IIIA right breast cancer (T3N1M0) s/p neoadjuvant chemotherapy followed by chest wall radiation (completed 10/21/2013) and letrozole (began 10/24/2013).  No evidence of PE on chest CT angiogram.  Patient has left axillary DVT on Lovenox.  Labs drawn today (labeled as 11/23 at 6:09) with elevated WBC likely secondary to steroids.  2. Pulmonary:  Await bronchoscopy results from yesterday.  Preliminary negative.  On Zosyn and vancomycin.  Duonebs scheduled.  Prednisone 20 mg BID started  today.  Further intervention (transbronhial biopsy/EBUS) per pulmonary medicine if patient does not clinically improve.   Lequita Asal, MD  07/25/2015, 11:16 AM

## 2015-07-25 NOTE — Progress Notes (Signed)
Pt desated into the 50s and 60s after walking to the bathroom. Pt states she feels the breathing tx are making her worse. Patient increased to 6 L [place] reached 90% 02. Will continue to monitor.

## 2015-07-26 ENCOUNTER — Inpatient Hospital Stay: Payer: No Typology Code available for payment source

## 2015-07-26 DIAGNOSIS — C3412 Malignant neoplasm of upper lobe, left bronchus or lung: Secondary | ICD-10-CM | POA: Diagnosis not present

## 2015-07-26 DIAGNOSIS — J9601 Acute respiratory failure with hypoxia: Secondary | ICD-10-CM | POA: Diagnosis not present

## 2015-07-26 DIAGNOSIS — J701 Chronic and other pulmonary manifestations due to radiation: Secondary | ICD-10-CM | POA: Diagnosis not present

## 2015-07-26 DIAGNOSIS — C3490 Malignant neoplasm of unspecified part of unspecified bronchus or lung: Secondary | ICD-10-CM | POA: Diagnosis not present

## 2015-07-26 DIAGNOSIS — J90 Pleural effusion, not elsewhere classified: Secondary | ICD-10-CM | POA: Diagnosis not present

## 2015-07-26 DIAGNOSIS — C3431 Malignant neoplasm of lower lobe, right bronchus or lung: Secondary | ICD-10-CM | POA: Diagnosis not present

## 2015-07-26 DIAGNOSIS — R0600 Dyspnea, unspecified: Secondary | ICD-10-CM | POA: Diagnosis not present

## 2015-07-26 DIAGNOSIS — C3492 Malignant neoplasm of unspecified part of left bronchus or lung: Secondary | ICD-10-CM | POA: Diagnosis not present

## 2015-07-26 DIAGNOSIS — J700 Acute pulmonary manifestations due to radiation: Secondary | ICD-10-CM | POA: Diagnosis not present

## 2015-07-26 DIAGNOSIS — J9621 Acute and chronic respiratory failure with hypoxia: Secondary | ICD-10-CM | POA: Diagnosis not present

## 2015-07-26 DIAGNOSIS — J189 Pneumonia, unspecified organism: Secondary | ICD-10-CM | POA: Diagnosis not present

## 2015-07-26 LAB — CULTURE, RESPIRATORY: CULTURE: NORMAL

## 2015-07-26 LAB — CULTURE, RESPIRATORY W GRAM STAIN

## 2015-07-26 LAB — CREATININE, SERUM
Creatinine, Ser: 0.97 mg/dL (ref 0.44–1.00)
GFR calc Af Amer: >60 mL/min (ref >60)

## 2015-07-26 LAB — GLUCOSE, CAPILLARY
Glucose-Capillary: 112 mg/dL — ABNORMAL HIGH (ref 65–99)
Glucose-Capillary: 87 mg/dL (ref 65–99)
Glucose-Capillary: 115 mg/dL — ABNORMAL HIGH (ref 65–99)
Glucose-Capillary: 217 mg/dL — ABNORMAL HIGH (ref 65–99)

## 2015-07-26 LAB — MRSA PCR SCREENING: MRSA BY PCR: POSITIVE — AB

## 2015-07-26 MED ORDER — MORPHINE SULFATE (PF) 2 MG/ML IV SOLN
1.0000 mg | Freq: Four times a day (QID) | INTRAVENOUS | Status: AC | PRN
  Administered 2015-07-26 – 2015-07-28 (×3): 1 mg via INTRAVENOUS
  Filled 2015-07-26 (×3): qty 1

## 2015-07-26 MED ORDER — MUPIROCIN 2 % EX OINT
1.0000 "application " | TOPICAL_OINTMENT | Freq: Two times a day (BID) | CUTANEOUS | Status: AC
  Administered 2015-07-26 – 2015-07-30 (×10): 1 via NASAL
  Filled 2015-07-26: qty 22

## 2015-07-26 MED ORDER — METHYLPREDNISOLONE SODIUM SUCC 125 MG IJ SOLR: 60.0000 mg | Freq: Three times a day (TID) | INTRAMUSCULAR | Status: DC

## 2015-07-26 MED ORDER — IPRATROPIUM-ALBUTEROL 0.5-2.5 (3) MG/3ML IN SOLN: 3.0000 mL | RESPIRATORY_TRACT | Status: DC | PRN

## 2015-07-26 MED ORDER — PREDNISONE 20 MG PO TABS
40.0000 mg | ORAL_TABLET | Freq: Every day | ORAL | Status: DC
  Administered 2015-07-30: 40 mg via ORAL
  Filled 2015-07-26: qty 2

## 2015-07-26 MED ORDER — CHLORHEXIDINE GLUCONATE CLOTH 2 % EX PADS
6.0000 | MEDICATED_PAD | Freq: Every day | CUTANEOUS | Status: DC
  Administered 2015-07-26: 6 via TOPICAL

## 2015-07-26 MED ORDER — ENSURE ENLIVE PO LIQD
237.0000 mL | Freq: Two times a day (BID) | ORAL | Status: DC
Start: 2015-07-26 — End: 2015-07-30
  Administered 2015-07-26 – 2015-07-30 (×7): 237 mL via ORAL

## 2015-07-26 MED ORDER — METHYLPREDNISOLONE SODIUM SUCC 125 MG IJ SOLR
60.0000 mg | Freq: Three times a day (TID) | INTRAMUSCULAR | Status: AC
  Administered 2015-07-26 – 2015-07-29 (×11): 60 mg via INTRAVENOUS
  Filled 2015-07-26 (×12): qty 2

## 2015-07-26 MED ORDER — VANCOMYCIN HCL IN DEXTROSE 1-5 GM/200ML-% IV SOLN
1000.0000 mg | Freq: Two times a day (BID) | INTRAVENOUS | Status: AC
  Administered 2015-07-27 – 2015-07-29 (×5): 1000 mg via INTRAVENOUS
  Filled 2015-07-26 (×5): qty 200

## 2015-07-26 MED ORDER — PREDNISONE 20 MG PO TABS: 40.0000 mg | ORAL_TABLET | Freq: Every day | ORAL | Status: DC

## 2015-07-26 NOTE — Progress Notes (Signed)
Patient on HFNC 50% at 45 lpm saturations 80%, patinet breathing greater than 30bpm.  Notified patient's nurse about patient in distress she cam to room.  Patient turned up to 100% 50lpm still breathing greater than 30bpm stating she can't catch her breath.  Rapid response called patient placed on BIPAP IPAP 14 EPAP 6 100% and transported to CCU.

## 2015-07-26 NOTE — Progress Notes (Signed)
[facility] Hematology/Oncology Progress Note  Date of admission: 07/14/2015  Hospital day:  07/26/2015  Chief Complaint: Brittney Lam is a 54 y.o. female with locally advanced LUL lung cancer who was admitted with left upper lobe and right lower lobe consolidation.  Subjective: Respiratory distress this morning requiring transfer to ICU on BiPAP.  Breathing more comfortably now.   Social History: The patient is alone today.  Allergies:  Allergies  Allergen Reactions  . Metformin Diarrhea and Nausea Only  . Other Rash    Sage Wipes    Scheduled Medications: . azithromycin  250 mg Oral Daily  . butamben-tetracaine-benzocaine  1 spray Topical Once  . calcium citrate-vitamin D  1 tablet Oral BID  . Chlorhexidine Gluconate Cloth  6 each Topical Q0600  . enoxaparin (LOVENOX) injection  1.5 mg/kg Subcutaneous Q24H  . glipiZIDE  5 mg Oral Q breakfast  . hydrochlorothiazide  12.5 mg Oral Daily  . insulin aspart  0-15 Units Subcutaneous TID WC  . insulin aspart  0-5 Units Subcutaneous QHS  . ipratropium-albuterol  3 mL Nebulization Q6H  . letrozole  2.5 mg Oral Daily  . lidocaine  1 application Topical Once  . [START ON 07/30/2015] predniSONE  40 mg Oral Q breakfast   Followed by  . methylPREDNISolone (SOLU-MEDROL) injection  60 mg Intravenous 3 times per day  . mupirocin ointment  1 application Nasal BID  . pantoprazole  40 mg Oral Daily  . PARoxetine  10 mg Oral QHS  . piperacillin-tazobactam (ZOSYN)  IV  3.375 g Intravenous 3 times per day  . potassium chloride SA  20 mEq Oral Daily  . simvastatin  10 mg Oral Daily  . vancomycin  1,000 mg Intravenous Q8H    Review of Systems: GENERAL:  Fatigue.  No fevers or sweats. PERFORMANCE STATUS (ECOG):  3 HEENT:  No visual changes, runny nose, sore throat, mouth sores or tenderness. Lungs: Progressive shortness of breath.  Pink sputum.  Cough.  Wheezing.  No hemoptysis. Cardiac:  No chest pain, palpitations,  orthopnea, or PND. GI:  No nausea, vomiting, diarrhea, constipation, melena or hematochezia. GU:  No urgency, frequency, dysuria, or hematuria. Musculoskeletal:  No back pain.  No joint pain.  No muscle tenderness. Extremities:  No pain or swelling. Skin:  No rashes or skin changes. Neuro:  Generalized weakness.  No headache, numbness or weakness, balance or coordination issues. Endocrine:  Diabetes.  No thyroid issues, hot flashes or night sweats. Psych:  Anxiety.  No mood changes or depression. Pain:  No focal pain. Review of systems:  All other systems reviewed and found to be negative.  Physical Exam: Blood pressure 135/81, pulse 107, temperature 99.4 F (37.4 C), temperature source Axillary, resp. rate 29, height _0  (1.575 m), weight 144 lb (65.318 kg), SpO2 97 %.  GENERAL:  Well developed, well nourished, woman in mild respiratory distress in the ICU. MENTAL STATUS:  Alert and oriented to person, place and time. HEAD:  Wearing a head wrap. Normocephalic, atraumatic, face symmetric, no Cushingoid features. EYES:  Brown eyes.  Pupils equal round and reactive to light and accomodation.  No conjunctivitis or scleral icterus. ENT:  BiPAP in place.  Oropharynx clear without lesion.  Tongue normal. Mucous membranes moist.  RESPIRATORY:  Diffuse wheezes (right > left).  No rhonchi. CARDIOVASCULAR:  Regular rate and rhythm without murmur, rub or gallop. ABDOMEN:  Soft, non-tender, with active bowel sounds, and no appreciable hepatosplenomegaly.  No masses. SKIN:  No  rashes, ulcers or lesions. EXTREMITIES: Mild left upper extremity edema.  No lower extremity edema, no skin discoloration or tenderness.  No palpable cords. LYMPH NODES: No palpable cervical, supraclavicular, axillary or inguinal adenopathy  NEUROLOGICAL: Unremarkable. PSYCH:  Appropriate.  Results for orders placed or performed during the hospital encounter of 07/23/2015 (from the past 48 hour(s))  Glucose, capillary      Status: Abnormal   Collection Time: 07/18/2015 11:53 AM  Result Value Ref Range   Glucose-Capillary 155 (H) 65 - 99 mg/dL   Comment 1 Notify RN   Glucose, capillary     Status: Abnormal   Collection Time: 07/18/2015  5:16 PM  Result Value Ref Range   Glucose-Capillary 302 (H) 65 - 99 mg/dL   Comment 1 Notify RN   Glucose, capillary     Status: Abnormal   Collection Time: 07/09/2015  9:15 PM  Result Value Ref Range   Glucose-Capillary 234 (H) 65 - 99 mg/dL   Comment 1 Notify RN   Vancomycin, trough     Status: None   Collection Time: 07/29/2015 10:13 PM  Result Value Ref Range   Vancomycin Tr 14 10 - 20 ug/mL  Glucose, capillary     Status: Abnormal   Collection Time: 07/25/15  7:29 AM  Result Value Ref Range   Glucose-Capillary 107 (H) 65 - 99 mg/dL  Glucose, capillary     Status: Abnormal   Collection Time: 07/25/15 11:32 AM  Result Value Ref Range   Glucose-Capillary 140 (H) 65 - 99 mg/dL  Glucose, capillary     Status: Abnormal   Collection Time: 07/25/15  4:30 PM  Result Value Ref Range   Glucose-Capillary 204 (H) 65 - 99 mg/dL  Glucose, capillary     Status: Abnormal   Collection Time: 07/25/15  9:17 PM  Result Value Ref Range   Glucose-Capillary 229 (H) 65 - 99 mg/dL  Creatinine, serum     Status: None   Collection Time: 07/26/15  4:45 AM  Result Value Ref Range   Creatinine, Ser 0.97 0.44 - 1.00 mg/dL   GFR calc non Af Amer >60 >60 mL/min   GFR calc Af Amer >60 >60 mL/min    Comment: (NOTE) The eGFR has been calculated using the CKD EPI equation. This calculation has not been validated in all clinical situations. eGFR's persistently <60 mL/min signify possible Chronic Kidney Disease.   Glucose, capillary     Status: Abnormal   Collection Time: 07/26/15  7:34 AM  Result Value Ref Range   Glucose-Capillary 112 (H) 65 - 99 mg/dL  MRSA PCR Screening     Status: Abnormal   Collection Time: 07/26/15  9:01 AM  Result Value Ref Range   MRSA by PCR POSITIVE (A) NEGATIVE     Comment:        The GeneXpert MRSA Assay (FDA approved for NASAL specimens only), is one component of a comprehensive MRSA colonization surveillance program. It is not intended to diagnose MRSA infection nor to guide or monitor treatment for MRSA infections. CRITICAL RESULT CALLED TO, READ BACK BY AND VERIFIED WITH: KENDRA SIMSER ON 07/26/15 AT 1036 BY QSD    Dg Chest Port 1 View  07/26/2015  CLINICAL DATA:  Respiratory failure. History of lung and breast cancer. EXAM: PORTABLE CHEST 1 VIEW COMPARISON:  07/22/2015 FINDINGS: Left-sided injectable central venous catheter is stable. Cardiomediastinal silhouette is normal. Mediastinal contours appear intact. There is no evidence of pneumothorax. Dense left upper lobe airspace consolidation/pulmonary mass is stable  in appearance. There also remains a patchy airspace consolidation in the right upper lobe. No definite evidence of pleural effusion. Osseous structures are without acute abnormality. There has been a prior right mastectomy. IMPRESSION: No significant change in the appearance of the lungs with dense left upper lobe airspace consolidation and/or pulmonary mass, and patchy airspace consolidation in the right upper lobe. Electronically Signed   By: Fidela Salisbury M.D.   On: 07/26/2015 09:31    Assessment:  Brittney Lam is a 54 y.o. female with locally advanced  LUL lung cancer s/p concurrent chemotherapy and radiation admitted with left upper lobe and right lower lobe consolidation.  She has been on broad spectrum antibiotics.  She underwent bronchoscopy on 07/06/2015.  Samples negative to date.  Oral steroids added 07/25/2015, but increased to solumedrol 60 mg IV q 8 hours today with respiratory decline requiring admission to ICU on BIPAP.  Plan: 1. Hematology/Oncology:   Initial T1bN0M0 RUL carcinoma s/p RUL lobectomy.  LUL mass with mediastinal adnopathy (T1N1M0) biopsy positive poorly differentiated adenocarcinoma c/w lung  primary.  Completed 6 cycles of carbo/Taxol with radiation on 04/10/2015.   No current evidence of progressive disease.    History of stage IIIA right breast cancer (T3N1M0) s/p neoadjuvant chemotherapy followed by chest wall radiation (completed 10/21/2013) and letrozole (began 10/24/2013).  Per patient, she has received radiation on 3 separate occasions.  Given deterioration, treating aggressively for radiation pneumonitis with IV steroids x 3 days then transition back to oral prednisone.  CXR today no significant change.    No evidence of PE on chest CT angiogram.  Patient has left axillary DVT on Lovenox.  Labs drawn today (labeled as 11/23 at 6:09) with elevated WBC likely secondary to steroids.  2. Pulmonary:  In ICU on BiPAP.  CXR stable.  Treatment initiated for radiation pneumonitis (solumedrol 60 mg IV q 8 hours x 3 days).  Follow-up bronchoscopy results from 07/09/2015.  Negative to date.  On Zosyn, vancomycin, and azithromycin.  Duonebs every 6 hours x 3 days then every 4 hours prn.  Morphine 1 mg q 6 hours prn SOB.  Appreciate Dr. Merian Capron assistance over holiday weekend.  Further intervention (transbronhial biopsy/EBUS) next week per Dr. Humphrey Rolls if patient does not clinically improve.  3.  Fluids/electrolytes and nutrition:  Sliding scale insulin for high dose steroids.  BMP daily.   Lequita Asal, MD  07/26/2015, 11:26 AM

## 2015-07-26 NOTE — Consult Note (Signed)
PULMONARY / CRITICAL CARE MEDICINE   Name: Brittney Lam MRN: [id number] DOB: 01/11/1961    ADMISSION DATE:  07/23/2015 CONSULTATION DATE:  07/26/15 REFERRING MD :  Dr. Rogue Bussing   CHIEF COMPLAINT:     Shortness of breath/pneumonia/radiation pneumonitis   HISTORY OF PRESENT ILLNESS   54 y.o. female with past history of DM HTN presented to the ED with increased shortness of breath cough and congestion on 11/21. Patient was diagnosed with recurrence of LUL cancer and has been undergoing chemotherapy for this. Patient has finished RT and chemo. Now presented with increased shortness of breath cough and congestion. Patient has had significant weakness noted also. Patient has had pinkish sputum noted. CT scan done showed changes of consolidation with airspace disease felt initially to be due to radiation pneumonitis and she was started on prednisone as an outpatient. Patient did not seem to have improvement noted. Patient was admitted as it was felt that there may be an underlying infectious process. She had a bronchoscopy with BAL on 11-23, cultures negative to date, today on the medical floor noted to have increased FiO2 requirement with saturations down today's and was transferred to the ICU for further monitoring.     SIGNIFICANT EVENTS   11/23>>bronchscopy with BAL by Dr. Humphrey Rolls  PAST MEDICAL HISTORY    :  Past Medical History  Diagnosis Date  . Unspecified essential hypertension   . Kidney failure   . Personal history of tobacco use, presenting hazards to health   . Cataract   . Personal history of malignant neoplasm of breast   . Breast screening, unspecified   . Special screening for malignant neoplasms, colon   . Diabetes mellitus without complication ([place])   . MDRO (multiple drug resistant organisms) resistance   . H. pylori duodenitis   . Complication of anesthesia     BP DROPPED DURING LOBECTOMY IN 2014  . Cancer [facility]) 2001    left breast cancer s/p L/SN/R in 2001   . Cancer [facility]) 2014    right breast invasive CA, Er pos,Pr neg, Her 2 neg.  . Lung cancer, upper lobe ([place]) 2014    right upper lobe  . Breast cancer [facility])    Past Surgical History  Procedure Laterality Date  . Intercostal nerve block  2005  . Cataract extraction w/ intraocular lens implant  2005  . Carpal tunnel release Right 2011  . Insertion central venous access device w/ subcutaneous port  12-07-12  . Breast lumpectomy Left 2001  . Breast surgery Right 2014    total mastectomy  . Right lung upper lobectomy  03/2013  . Lobectomy Right   . Cataract extraction    . Mastectomy Right   . Colonoscopy    . Endobronchial ultrasound N/A 02/05/2015    Procedure: ENDOBRONCHIAL ULTRASOUND;  Surgeon: Flora Lipps, MD;  Location: [facility] ORS;  Service: Cardiopulmonary;  Laterality: N/A;  . Flexible bronchoscopy N/A 07/02/2015    Procedure: FLEXIBLE BRONCHOSCOPY;  Surgeon: Allyne Gee, MD;  Location: [facility] ORS;  Service: Pulmonary;  Laterality: N/A;   Prior to Admission medications   Medication Sig Start Date End Date Taking? Authorizing Provider  acetaminophen (TYLENOL) 325 MG tablet Take 650 mg by mouth every 6 (six) hours as needed for pain.   Yes Historical Provider, MD  calcium citrate-vitamin D (CITRACAL+D) 315-200 MG-UNIT tablet Take 1 tablet by mouth 2 (two) times daily.   Yes Historical Provider, MD  glipiZIDE (GLUCOTROL XL) 5 MG 24 hr tablet Take by  mouth. 09/20/14 09/20/15 Yes Historical Provider, MD  hydrochlorothiazide (HYDRODIURIL) 25 MG tablet Take 12.5 mg by mouth daily.    Yes Historical Provider, MD  letrozole (FEMARA) 2.5 MG tablet Take 2.5 mg by mouth daily.   Yes Historical Provider, MD  Multiple Vitamins-Minerals (MULTIVITAMIN PO) Take by mouth.   Yes Historical Provider, MD  naproxen sodium (ANAPROX) 220 MG tablet Take 220 mg by mouth every 8 (eight) hours as needed.   Yes Historical Provider, MD  ondansetron (ZOFRAN) 4 MG tablet Take 1 tablet (4 mg total) by mouth every 6  (six) hours as needed for nausea or vomiting. 03/06/15  Yes Forest Gleason, MD  potassium chloride SA (K-DUR,KLOR-CON) 20 MEQ tablet Take 1 tablet (20 mEq total) by mouth daily. 05/08/15  Yes Forest Gleason, MD  simvastatin (ZOCOR) 10 MG tablet Take 10 mg by mouth daily.   Yes Historical Provider, MD  esomeprazole (NEXIUM) 20 MG capsule Take 1 capsule (20 mg total) by mouth every morning. Patient not taking: Reported on 07/03/2015 03/27/15   Noreene Filbert, MD  gabapentin (NEURONTIN) 300 MG capsule Take 300 mg by mouth as needed.    Historical Provider, MD  nicotine polacrilex (NICORETTE) 2 MG gum Take 2 mg by mouth 5 (five) times daily.     Historical Provider, MD  PARoxetine Mesylate (BRISDELLE PO) Take 1 tablet by mouth at bedtime.    Historical Provider, MD  venlafaxine (EFFEXOR) 37.5 MG tablet Take 1 tablet (37.5 mg total) by mouth daily. Patient not taking: Reported on 07/07/2015 04/17/15   Noreene Filbert, MD   Allergies  Allergen Reactions  . Metformin Diarrhea and Nausea Only  . Other Rash    Sage Wipes     FAMILY HISTORY   Family History  Problem Relation Age of Onset  . Diabetes Other   . Hyperlipidemia Other   . Hypertension Other       SOCIAL HISTORY    reports that she quit smoking about 2 years ago. Her smoking use included Cigarettes. She has a 15 pack-year smoking history. She has never used smokeless tobacco. She reports that she does not drink alcohol or use illicit drugs.  Review of Systems  Constitutional: Negative for fever, chills and weight loss.  HENT: Negative for hearing loss.   Respiratory: Positive for cough, sputum production, shortness of breath and wheezing.        Pinkish sputum, intermittent small specks of blood tinge  Cardiovascular: Positive for palpitations. Negative for chest pain, claudication and leg swelling.  Gastrointestinal: Negative for heartburn, nausea and vomiting.  Genitourinary: Negative for dysuria.  Neurological: Negative for  headaches.  Endo/Heme/Allergies: Does not bruise/bleed easily.  Psychiatric/Behavioral: Negative for depression.      VITAL SIGNS    Temp:  [99.1 F (37.3 C)-99.5 F (37.5 C)] 99.4 F (37.4 C) (11/25 0845) Pulse Rate:  [107-132] 107 (11/25 0900) Resp:  [18-29] 29 (11/25 0900) BP: (130-147)/(58-90) 135/81 mmHg (11/25 0900) SpO2:  [86 %-97 %] 97 % (11/25 0900) FiO2 (%):  [50 %-80 %] 80 % (11/25 0845) HEMODYNAMICS:   VENTILATOR SETTINGS: Vent Mode:  [-]  FiO2 (%):  [50 %-80 %] 80 % INTAKE / OUTPUT:  Intake/Output Summary (Last 24 hours) at 07/26/15 1104 Last data filed at 07/25/15 1200  Gross per 24 hour  Intake      0 ml  Output      0 ml  Net      0 ml       PHYSICAL EXAM  Physical Exam  Constitutional: She is oriented to person, place, and time. She appears well-developed and well-nourished.  Moderate respiratory distress on BiPAP  HENT:  Head: Normocephalic and atraumatic.  Right Ear: External ear normal.  Left Ear: External ear normal.  Eyes: Conjunctivae and EOM are normal. Pupils are equal, round, and reactive to light.  Neck: Normal range of motion. Neck supple.  Cardiovascular: Normal heart sounds and intact distal pulses.   No murmur heard. Pulmonary/Chest: She is in respiratory distress. She has wheezes.  On BiPAP, mild use of SCM, diffuse wheezes throughout  Abdominal: Soft. Bowel sounds are normal. She exhibits no distension.  Musculoskeletal: Normal range of motion.  Neurological: She is alert and oriented to person, place, and time.  Skin: Skin is warm and dry.  Nursing note and vitals reviewed.      LABS   LABS:  CBC  Recent Labs Lab 07/23/15 0505 07/28/2015 0452 07/25/2015 0609  WBC 15.2* 13.7* 19.6*  HGB 11.2* 10.5* 10.2*  HCT 33.9* 31.9* 31.8*  PLT 218 221 230   Coag's No results for input(s): APTT, INR in the last 168 hours. BMET  Recent Labs Lab 07/23/15 0505 07/27/2015 0452 07/10/2015 0609 07/26/15 0445  NA 138 139  140  --   K 4.0 4.0 3.7  --   CL 101 104 107  --   CO2 '29 29 25  '$ --   BUN '9 8 12  '$ --   CREATININE 0.76 0.75 0.83 0.97  GLUCOSE 142* 115* 143*  --    Electrolytes  Recent Labs Lab 07/02/2015 1253  07/23/15 0505 07/30/2015 0452 07/16/2015 0609  CALCIUM 9.4  < > 9.2 9.0 9.3  MG 2.1  --   --   --   --   < > = values in this interval not displayed. Sepsis Markers No results for input(s): LATICACIDVEN, PROCALCITON, O2SATVEN in the last 168 hours. ABG No results for input(s): PHART, PCO2ART, PO2ART in the last 168 hours. Liver Enzymes  Recent Labs Lab 07/11/2015 1253 07/20/15 0615  AST 19 16  ALT 20 17  ALKPHOS 98 79  BILITOT 0.7 0.4  ALBUMIN 3.4* 2.7*   Cardiac Enzymes No results for input(s): TROPONINI, PROBNP in the last 168 hours. Glucose  Recent Labs Lab 07/29/2015 2115 07/25/15 0729 07/25/15 1132 07/25/15 1630 07/25/15 2117 07/26/15 0734  GLUCAP 234* 107* 140* 204* 229* 112*     Recent Results (from the past 240 hour(s))  Culture, sputum-assessment     Status: None   Collection Time: 07/20/15  7:33 AM  Result Value Ref Range Status   Specimen Description EXPECTORATED SPUTUM  Final   Special Requests NONE  Final   Sputum evaluation   Final    Sputum specimen not acceptable for testing.  Please recollect.   Results Called to: [facility] AT 7408 ON 07/20/15 CTJ    Report Status 07/20/2015 FINAL  Final  Fungus Culture with Smear     Status: None (Preliminary result)   Collection Time: 07/09/2015  9:58 AM  Result Value Ref Range Status   Specimen Description BRONCHIAL WASHINGS  Final   Special Requests NONE  Final   Culture NO GROWTH < 24 HOURS  Final   Report Status PENDING  Incomplete  Culture, respiratory (NON-Expectorated)     Status: None   Collection Time: 07/17/2015  9:58 AM  Result Value Ref Range Status   Specimen Description BRONCHIAL WASHINGS  Final   Special Requests WASHING RIGHT  Final  Culture Consistent with normal respiratory flora.  Final    Report Status 07/26/2015 FINAL  Final  MRSA PCR Screening     Status: Abnormal   Collection Time: 07/26/15  9:01 AM  Result Value Ref Range Status   MRSA by PCR POSITIVE (A) NEGATIVE Final    Comment:        The GeneXpert MRSA Assay (FDA approved for NASAL specimens only), is one component of a comprehensive MRSA colonization surveillance program. It is not intended to diagnose MRSA infection nor to guide or monitor treatment for MRSA infections. CRITICAL RESULT CALLED TO, READ BACK BY AND VERIFIED WITH: KENDRA SIMSER ON 07/26/15 AT 1036 BY QSD      Current facility-administered medications:  .  0.9 %  sodium chloride infusion, , Intravenous, Continuous, Evlyn Kanner, NP, Last Rate: 50 mL/hr at 07/25/15 1709 .  acetaminophen (TYLENOL) tablet 650 mg, 650 mg, Oral, Q4H PRN, Evlyn Kanner, NP, 650 mg at 07/22/15 1328 .  [COMPLETED] azithromycin (ZITHROMAX) tablet 500 mg, 500 mg, Oral, Daily, 500 mg at 07/23/15 1545 **FOLLOWED BY** azithromycin (ZITHROMAX) tablet 250 mg, 250 mg, Oral, Daily, Adrian Prows, MD, 250 mg at 07/25/15 0850 .  butamben-tetracaine-benzocaine (CETACAINE) spray 1 spray, 1 spray, Topical, Once, Allyne Gee, MD .  calcium citrate-vitamin D 500-400 MG-UNIT per chewable tablet 1 tablet, 1 tablet, Oral, BID, Evlyn Kanner, NP, 1 tablet at 07/25/15 2136 .  Chlorhexidine Gluconate Cloth 2 % PADS 6 each, 6 each, Topical, Q0600, Erabella Kuipers, MD .  enoxaparin (LOVENOX) injection 100 mg, 1.5 mg/kg, Subcutaneous, Q24H, Cammie Sickle, MD, 100 mg at 07/25/15 1940 .  glipiZIDE (GLUCOTROL XL) 24 hr tablet 5 mg, 5 mg, Oral, Q breakfast, Evlyn Kanner, NP, 5 mg at 07/25/15 0902 .  guaiFENesin-dextromethorphan (ROBITUSSIN DM) 100-10 MG/5ML syrup 10 mL, 10 mL, Oral, Q4H PRN, Evlyn Kanner, NP, 10 mL at 07/22/2015 2235 .  hydrochlorothiazide (HYDRODIURIL) tablet 12.5 mg, 12.5 mg, Oral, Daily, Evlyn Kanner, NP, 12.5 mg at 07/25/15 0850 .   HYDROcodone-acetaminophen (NORCO/VICODIN) 5-325 MG per tablet 1-2 tablet, 1-2 tablet, Oral, Q4H PRN, Evlyn Kanner, NP, 1 tablet at 07/11/2015 1116 .  insulin aspart (novoLOG) injection 0-15 Units, 0-15 Units, Subcutaneous, TID WC, Cammie Sickle, MD, 5 Units at 07/25/15 1700 .  insulin aspart (novoLOG) injection 0-5 Units, 0-5 Units, Subcutaneous, QHS, Cammie Sickle, MD, 2 Units at 07/25/15 2136 .  ipratropium-albuterol (DUONEB) 0.5-2.5 (3) MG/3ML nebulizer solution 3 mL, 3 mL, Nebulization, Q6H, Kysen Wetherington, MD, 3 mL at 07/26/15 0439 .  letrozole [facility]) tablet 2.5 mg, 2.5 mg, Oral, Daily, Evlyn Kanner, NP, 2.5 mg at 07/25/15 0850 .  lidocaine (XYLOCAINE) 2 % jelly 1 application, 1 application, Topical, Once, Allyne Gee, MD .  methylPREDNISolone sodium succinate (SOLU-MEDROL) 125 mg/2 mL injection 60 mg, 60 mg, Intravenous, 3 times per day **FOLLOWED BY** [START ON 07/30/2015] predniSONE (DELTASONE) tablet 40 mg, 40 mg, Oral, Q breakfast, Jennae Hakeem, MD .  morphine 2 MG/ML injection 1 mg, 1 mg, Intravenous, Q6H PRN, Judithe Keetch, MD .  mupirocin ointment (BACTROBAN) 2 % 1 application, 1 application, Nasal, BID, Terika Pillard, MD .  ondansetron (ZOFRAN) injection 4 mg, 4 mg, Intravenous, Q6H PRN, Cammie Sickle, MD, 4 mg at 07/20/15 0121 .  pantoprazole (PROTONIX) EC tablet 40 mg, 40 mg, Oral, Daily, Evlyn Kanner, NP, 40 mg at 07/25/15 0850 .  PARoxetine (PAXIL) tablet 10 mg, 10 mg, Oral, QHS, Magda Paganini  F Herring, NP, 10 mg at 07/25/15 2136 .  phenylephrine (NEO-SYNEPHRINE) 0.25 % nasal spray 1 spray, 1 spray, Each Nare, Q6H PRN, Allyne Gee, MD .  piperacillin-tazobactam (ZOSYN) IVPB 3.375 g, 3.375 g, Intravenous, 3 times per day, Charlett Nose, RPH, 3.375 g at 07/26/15 0308 .  potassium chloride SA (K-DUR,KLOR-CON) CR tablet 20 mEq, 20 mEq, Oral, Daily, Evlyn Kanner, NP, 20 mEq at 07/25/15 0850 .  senna-docusate (Senokot-S) tablet 1 tablet, 1 tablet,  Oral, QHS PRN, Evlyn Kanner, NP .  simvastatin (ZOCOR) tablet 10 mg, 10 mg, Oral, Daily, Evlyn Kanner, NP, 10 mg at 07/25/15 1940 .  sodium chloride 0.9 % injection 10 mL, 10 mL, Intravenous, PRN, Cammie Sickle, MD, 10 mL at 07/22/15 2010 .  sodium chloride HYPERTONIC 3 % nebulizer solution 5 mL, 5 mL, Nebulization, Once PRN, Adrian Prows, MD, 5 mL at 07/23/15 1617 .  vancomycin (VANCOCIN) IVPB 1000 mg/200 mL premix, 1,000 mg, Intravenous, Q8H, Cammie Sickle, MD, 1,000 mg at 07/26/15 0453  Facility-Administered Medications Ordered in Other Encounters:  .  sodium chloride 0.9 % injection 10 mL, 10 mL, Intracatheter, PRN, Forest Gleason, MD, 10 mL at 02/04/15 1027  IMAGING    Dg Chest Port 1 View  07/26/2015  CLINICAL DATA:  Respiratory failure. History of lung and breast cancer. EXAM: PORTABLE CHEST 1 VIEW COMPARISON:  07/22/2015 FINDINGS: Left-sided injectable central venous catheter is stable. Cardiomediastinal silhouette is normal. Mediastinal contours appear intact. There is no evidence of pneumothorax. Dense left upper lobe airspace consolidation/pulmonary mass is stable in appearance. There also remains a patchy airspace consolidation in the right upper lobe. No definite evidence of pleural effusion. Osseous structures are without acute abnormality. There has been a prior right mastectomy. IMPRESSION: No significant change in the appearance of the lungs with dense left upper lobe airspace consolidation and/or pulmonary mass, and patchy airspace consolidation in the right upper lobe. Electronically Signed   By: Fidela Salisbury M.D.   On: 07/26/2015 09:31      Indwelling Urinary Catheter continued, requirement due to   Reason to continue Indwelling Urinary Catheter for strict Intake/Output monitoring for hemodynamic instability   Central Line continued, requirement due to   Reason to continue [employer] Monitoring of central venous pressure or other  hemodynamic parameters   Ventilator continued, requirement due to, resp failure    Ventilator Sedation RASS 0 to -2   Cultures: BCx2  UC  Sputum BAL 11/23>>  Antibiotics: Vanc 11/18>>11/29 Zosyn 11/18>>11/29 Azithro 11/22>>11/28 Lines:   ASSESSMENT/PLAN  54 year old female with bilateral pneumonia, recurrent left upper lobe lung cancer, shortness of breath, admitted to the ICU for respiratory distress and hypoxia  PULMONARY Respiratory failure-hypoxia Recurrent left upper lobe lung cancer Radiation pneumonitis Bilateral pneumonia  P:   - Continue with BiPAP, wean as tolerated, may give breaks on HFNC -Maintain O2 saturations greater than 88% -Vancomycin/solution for 10 days total, a Zithromax and for 5 days total -Duo nebs scheduled every 6 hours for 3 days then as needed every 4 hours -Change oral steroids to IV, 60 every 63 days then transition back to prednisone 40 mg daily until outpatient follow-up with pulmonologist -Chest x-ray with left upper lobe consolidation, however review the images and deemed that this is most likely radiation pneumonitis. -Status post bronchoscopy with BAL on 11/23 - cultures negative to date -10 days total of antibiotics -Patient does have a mild anxiety component with her respiratory distress, 1 mg morphine IV  every 6 hours for shortness of breath and chest pain 3 days -Lung cancer/stage III status post chemoradiation, finished 04/10/2015; clinically patient fits time window for radiation pneumonitis and will treat as such  CARDIOVASCULAR -Continue to monitor hemodynamic status   RENAL -Monitor electrolytes -ICU to try protocol replacement  GASTROINTESTINAL SUP - PPI  HEMATOLOGIC History of breast cancer Stage III left upper lobe lung cancer -Status post chemoradiation 04/10/2015 -Hematology/oncology following -Continue with monitoring of CBC   INFECTIOUS Bilateral pneumonia -Continue with current antibiotics, for a  total of 10 days -Continue to follow-up BAL cultures   ENDOCRINE -ssi  NEUROLOGIC RASS goal: 0   I have personally obtained a history, examined the patient, evaluated laboratory and imaging results, formulated the assessment and plan and placed orders.  The Patient requires high complexity decision making for assessment and support, frequent evaluation and titration of therapies, application of advanced monitoring technologies and extensive interpretation of multiple databases.   Critical Care Time devoted to patient care services described in this note is 55 minutes.   Overall, patient is critically ill, prognosis is guarded. Patient at high risk for cardiac arrest and death.   Vilinda Boehringer, MD [facility] Pager [phone] (please enter 7-digits) On Call Pager [phone] (please enter 7-digits)     07/26/2015, 11:04 AM  Note: This note was prepared with Dragon dictation along with smaller phrase technology. Any transcriptional errors that result from this process are unintentional.

## 2015-07-26 NOTE — Plan of Care (Signed)
Problem: Education: Goal: Knowledge of [facility] Education information/materials will improve Outcome: Progressing Discussed medications given.   Pt having no pain this shift. Continues to have dyspnea with exertion.  O2 continues at 6L.      Problem: Respiratory: Goal: Respiratory status will improve Outcome: Progressing Pt continues to have SOB with exertion and is on 6L O2.   Wheezes are expiratory.  Continues on IV abx and duonebs.

## 2015-07-26 NOTE — Progress Notes (Signed)
Nutrition Follow-up    INTERVENTION:   Meals and Snacks: Cater to patient preferences, recommend holding meal trays if pt unsafe for po intake Medical Food Supplement Therapy: recommend changing supplement ot Ensure Enlive po BID, each supplement provides 350 kcal and 20 grams of protein   NUTRITION DIAGNOSIS:   Unintentional weight loss related to chronic illness as evidenced by per patient/family report.  GOAL:   Patient will meet greater than or equal to 90% of their needs  MONITOR:    (Energy intake, Anthropometrics)  REASON FOR ASSESSMENT:   Consult    ASSESSMENT:    Pt s/p rapid response this AM due to respiratory distress, did not tolerate HFNC, currently on 70% Bipap, transferred to ICU. Pt being treated for radiation pneumonitis.  Diet Order:  Diet regular Room service appropriate?: Yes; Fluid consistency:: Thin   Energy Intake: pt unable to tolerate po intake at present due to respiratory status, recorded po intake 35% on average  Skin:  Reviewed, no issues  Last BM:  11/25  Electrolyte and Renal Profile:  Recent Labs Lab 07/18/2015 1253  07/23/15 0505 07/25/2015 0452 07/17/2015 0609 07/26/15 0445  BUN 14  < > '9 8 12  '$ --   CREATININE 0.67  < > 0.76 0.75 0.83 0.97  NA 135  < > 138 139 140  --   K 3.9  < > 4.0 4.0 3.7  --   MG 2.1  --   --   --   --   --   < > = values in this interval not displayed. Glucose Profile:   Recent Labs  07/25/15 2117 07/26/15 0734 07/26/15 1155  GLUCAP 229* 112* 87   Nutritional Anemia Profile:  CBC Latest Ref Rng 07/12/2015 07/15/2015 07/23/2015  WBC 3.6 - 11.0 K/uL 19.6(H) 13.7(H) 15.2(H)  Hemoglobin 12.0 - 16.0 g/dL 10.2(L) 10.5(L) 11.2(L)  Hematocrit 35.0 - 47.0 % 31.8(L) 31.9(L) 33.9(L)  Platelets 150 - 440 K/uL 230 221 218   Meds: NS at 50 ml/hr, glucotrol, ss novolog,   Height:   Ht Readings from Last 1 Encounters:  07/08/2015 '5\' 2"'$  (1.575 m)    Weight:   Wt Readings from Last 1 Encounters:  07/18/2015  144 lb (65.318 kg)   Filed Weights   07/08/2015 1142  Weight: 144 lb (65.318 kg)    BMI:  Body mass index is 26.33 kg/(m^2).  Estimated Nutritional Needs:   Kcal:  1625-1950 kcals   Protein:  72-85 g (1.1-1.3 g/kg)   Fluid:  1950-2275 mL (30-35 ml/kg)   EDUCATION NEEDS:   No education needs identified at this time  [place], [place], LDN [phone] Pager

## 2015-07-26 NOTE — Progress Notes (Signed)
ANTIBIOTIC CONSULT NOTE - Follow up  Pharmacy Consult for Zosyn/Vancomycin Dosing Indication: rule out pneumonia  Allergies  Allergen Reactions  . Metformin Diarrhea and Nausea Only  . Other Rash    Sage Wipes    Patient Measurements: Height: '5\' 2"'$  (157.5 cm) Weight: 144 lb (65.318 kg) IBW/kg (Calculated) : 50.1 Adjusted Body Weight: 56 kg  Vital Signs: Temp: 99.4 F (37.4 C) (11/25 0845) Temp Source: Axillary (11/25 0845) BP: 135/81 mmHg (11/25 0900) Pulse Rate: 107 (11/25 0900) Intake/Output from previous day: 11/24 0701 - 11/25 0700 In: 120 [P.O.:120] Out: -  Intake/Output from this shift: Total I/O In: -  Out: 350 [Urine:350]  Labs:  Recent Labs  07/10/2015 0452 07/02/2015 0609 07/26/15 0445  WBC 13.7* 19.6*  --   HGB 10.5* 10.2*  --   PLT 221 230  --   CREATININE 0.75 0.83 0.97   Estimated Creatinine Clearance: 58.1 mL/min (by C-G formula based on Cr of 0.97).  Recent Labs  07/05/2015 2213 07/26/15 1208  VANCOTROUGH 14 28*     Microbiology: Recent Results (from the past 720 hour(s))  Culture, sputum-assessment     Status: None   Collection Time: 07/20/15  7:33 AM  Result Value Ref Range Status   Specimen Description EXPECTORATED SPUTUM  Final   Special Requests NONE  Final   Sputum evaluation   Final    Sputum specimen not acceptable for testing.  Please recollect.   Results Called to: [facility] AT [id number] ON 07/20/15 CTJ    Report Status 07/20/2015 FINAL  Final  Fungus Culture with Smear     Status: None (Preliminary result)   Collection Time: 07/08/2015  9:58 AM  Result Value Ref Range Status   Specimen Description BRONCHIAL WASHINGS  Final   Special Requests NONE  Final   Culture NO GROWTH 2 DAYS  Final   Report Status PENDING  Incomplete  Culture, respiratory (NON-Expectorated)     Status: None   Collection Time: 07/22/2015  9:58 AM  Result Value Ref Range Status   Specimen Description BRONCHIAL WASHINGS  Final   Special Requests WASHING  RIGHT  Final   Culture Consistent with normal respiratory flora.  Final   Report Status 07/26/2015 FINAL  Final  MRSA PCR Screening     Status: Abnormal   Collection Time: 07/26/15  9:01 AM  Result Value Ref Range Status   MRSA by PCR POSITIVE (A) NEGATIVE Final    Comment:        The GeneXpert MRSA Assay (FDA approved for NASAL specimens only), is one component of a comprehensive MRSA colonization surveillance program. It is not intended to diagnose MRSA infection nor to guide or monitor treatment for MRSA infections. CRITICAL RESULT CALLED TO, READ BACK BY AND VERIFIED WITH: KENDRA SIMSER ON 07/26/15 AT 1036 BY QSD     Assessment: Pharmacy consulted to dose vancomycin and Zosyn for 54 yo female being treated for left upper lobe PNA. ID consulted, azithromycin added 11/22.  On day 7 of vancomycin/zosyn Patient was started on dose of vancomycin  1g every 8  Hours. Trough today at 1208 supratherapeutic at 28. Patients Scr has gradually increased from 0.75 on 9/23  to 0.97 today.   Goal of Therapy:  Vancomycin trough level 15-20 mcg/ml  Plan:  Continue zosyn 3.375gm IV Q8H extended infusion. Stop date 07/29/15.  Azithromycin stop date added for total of 5 days of treatment.  Will change Vancomycin regimen to 1g every 12 hours starting tonight at  0000. Stop date has been added to Vanc @ 0000 on 07/29/15 per Dr. Stevenson Clinch and ID treatment plan of 10 days.  Recommend not checking trough unless significant increase in WBC, 50% increase in Scr or clinical worsening.   Pharmacy will continue to monitor  renal function and labs and make adjustments as needed.    Nancy Fetter, PharmD Pharmacy Resident 07/26/2015 2:35 PM

## 2015-07-26 NOTE — Consult Note (Signed)
[place] at [place] NAME: Brittney Lam    MR#:  [id number]  DATE OF BIRTH:  October 09, 1960  DATE OF ADMISSION:  07/30/2015  PRIMARY CARE PHYSICIAN: [name], MD   REQUESTING/REFERRING PHYSICIAN: Nolon Stalls  CHIEF COMPLAINT: medical management.  HISTORY OF PRESENT ILLNESS: Brittney Lam  is a 54 y.o. 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 Infectious diseases specialist and pulmonologist are following the patient, patient is on broad-spectrum antibiotics. Bronchoscopy and BAL is done 07/22/2015 and the sample is not growing any bacteria as or fungus until now.  Patient is also maintained on steroids for possibility of radiation pneumonitis.  She was on floor with oxygen supplementation but today morning because of worsening oxygen requirement she is transferred to ICU, initially required BiPAP and currently on high flow nasal cannula oxygen.  On questioning patient agrees for having shortness of breath and some tightness in the chest but she denies any chest pain or palpitation feeling. She says she has cough and some pink colored sputum production.  PAST MEDICAL HISTORY:   Past Medical History  Diagnosis Date  . Unspecified essential hypertension   . Kidney failure   . Personal history of tobacco use, presenting hazards to health   . Cataract   . Personal history of malignant neoplasm of breast   . Breast screening, unspecified   . Special screening for malignant neoplasms, colon   . Diabetes mellitus without complication ([place])   . MDRO (multiple drug resistant organisms) resistance   . H. pylori duodenitis   . Complication of anesthesia     BP DROPPED DURING LOBECTOMY IN  2014  . Cancer [facility]) 2001    left breast cancer s/p L/SN/R in 2001  . Cancer [facility]) 2014    right breast invasive CA, Er pos,Pr neg, Her 2 neg.  . Lung cancer, upper lobe ([place]) 2014    right upper lobe  . Breast cancer ([place])     PAST SURGICAL HISTORY:  Past Surgical History  Procedure Laterality Date  . Intercostal nerve block  2005  . Cataract extraction w/ intraocular lens implant  2005  . Carpal tunnel release Right 2011  . Insertion central venous access device w/ subcutaneous port  12-07-12  . Breast lumpectomy Left 2001  . Breast surgery Right 2014    total mastectomy  . Right lung upper lobectomy  03/2013  . Lobectomy Right   . Cataract extraction    . Mastectomy Right   . Colonoscopy    . Endobronchial ultrasound N/A 02/05/2015    Procedure: ENDOBRONCHIAL ULTRASOUND;  Surgeon: Flora Lipps, MD;  Location: [facility] ORS;  Service: Cardiopulmonary;  Laterality: N/A;  . Flexible bronchoscopy N/A 07/07/2015    Procedure: FLEXIBLE BRONCHOSCOPY;  Surgeon: Allyne Gee, MD;  Location: [facility] ORS;  Service: Pulmonary;  Laterality: N/A;    SOCIAL HISTORY:  Social History  Substance Use Topics  . Smoking status: Former Smoker -- 0.50 packs/day for 30 years    Types: Cigarettes    Quit date: 02/03/2013  . Smokeless tobacco: Never Used     Comment: using '2mg'$  nicorette gum- 1/2 piece, 10 times per day  . Alcohol Use: No    FAMILY HISTORY:  Family History  Problem  Relation Age of Onset  . Diabetes Other   . Hyperlipidemia Other   . Hypertension Other     DRUG ALLERGIES:  Allergies  Allergen Reactions  . Metformin Diarrhea and Nausea Only  . Other Rash    Sage Wipes    REVIEW OF SYSTEMS:   CONSTITUTIONAL: No fever, fatigue or weakness.  EYES: No blurred or double vision.  EARS, NOSE, AND THROAT: No tinnitus or ear pain.  RESPIRATORY: Positive for cough, positive for shortness of breath, wheezing or hemoptysis.  CARDIOVASCULAR: No chest pain, orthopnea, edema.   GASTROINTESTINAL: No nausea, vomiting, diarrhea or abdominal pain.  GENITOURINARY: No dysuria, hematuria.  ENDOCRINE: No polyuria, nocturia,  HEMATOLOGY: No anemia, easy bruising or bleeding SKIN: No rash or lesion. MUSCULOSKELETAL: No joint pain or arthritis.   NEUROLOGIC: No tingling, numbness, weakness.  PSYCHIATRY: No anxiety or depression.   MEDICATIONS AT HOME:  Prior to Admission medications   Medication Sig Start Date End Date Taking? Authorizing Provider  acetaminophen (TYLENOL) 325 MG tablet Take 650 mg by mouth every 6 (six) hours as needed for pain.   Yes Historical Provider, MD  calcium citrate-vitamin D (CITRACAL+D) 315-200 MG-UNIT tablet Take 1 tablet by mouth 2 (two) times daily.   Yes Historical Provider, MD  glipiZIDE (GLUCOTROL XL) 5 MG 24 hr tablet Take by mouth. 09/20/14 09/20/15 Yes Historical Provider, MD  hydrochlorothiazide (HYDRODIURIL) 25 MG tablet Take 12.5 mg by mouth daily.    Yes Historical Provider, MD  letrozole (FEMARA) 2.5 MG tablet Take 2.5 mg by mouth daily.   Yes Historical Provider, MD  Multiple Vitamins-Minerals (MULTIVITAMIN PO) Take by mouth.   Yes Historical Provider, MD  naproxen sodium (ANAPROX) 220 MG tablet Take 220 mg by mouth every 8 (eight) hours as needed.   Yes Historical Provider, MD  ondansetron (ZOFRAN) 4 MG tablet Take 1 tablet (4 mg total) by mouth every 6 (six) hours as needed for nausea or vomiting. 03/06/15  Yes Forest Gleason, MD  potassium chloride SA (K-DUR,KLOR-CON) 20 MEQ tablet Take 1 tablet (20 mEq total) by mouth daily. 05/08/15  Yes Forest Gleason, MD  simvastatin (ZOCOR) 10 MG tablet Take 10 mg by mouth daily.   Yes Historical Provider, MD  esomeprazole (NEXIUM) 20 MG capsule Take 1 capsule (20 mg total) by mouth every morning. Patient not taking: Reported on 07/06/2015 03/27/15   Noreene Filbert, MD  gabapentin (NEURONTIN) 300 MG capsule Take 300 mg by mouth as needed.    Historical Provider, MD  nicotine polacrilex (NICORETTE) 2  MG gum Take 2 mg by mouth 5 (five) times daily.     Historical Provider, MD  PARoxetine Mesylate (BRISDELLE PO) Take 1 tablet by mouth at bedtime.    Historical Provider, MD  venlafaxine (EFFEXOR) 37.5 MG tablet Take 1 tablet (37.5 mg total) by mouth daily. Patient not taking: Reported on 07/22/2015 04/17/15   Noreene Filbert, MD      PHYSICAL EXAMINATION:   VITAL SIGNS: Blood pressure 121/75, pulse 138, temperature 98.7 F (37.1 C), temperature source Axillary, resp. rate 29, height '5\' 2"'$  (1.575 m), weight 65.318 kg (144 lb), SpO2 93 %.  GENERAL:  54 y.o.-year-old patient lying in the bed with no acute distress.  EYES: Pupils equal, round, reactive to light and accommodation. No scleral icterus. Extraocular muscles intact.  HEENT: Head atraumatic, normocephalic. Oropharynx and nasopharynx clear.  NECK:  Supple, no jugular venous distention. No thyroid enlargement, no tenderness.  LUNGS: Equal breath sounds bilaterally, mild wheezing, some crepitation. Using  high flow nasal cannula. Using accessory muscles of respiration currently.  CARDIOVASCULAR: S1, S2 normal. No murmurs, rubs, or gallops.  ABDOMEN: Soft, nontender, nondistended. Bowel sounds present. No organomegaly or mass.  EXTREMITIES: No pedal edema, cyanosis, or clubbing.  NEUROLOGIC: Cranial nerves II through XII are intact. Muscle strength 5/5 in all extremities. Sensation intact. Gait not checked.  PSYCHIATRIC: The patient is alert and oriented x 3.  SKIN: No obvious rash, lesion, or ulcer.   LABORATORY PANEL:   CBC  Recent Labs Lab 07/21/15 0251 07/22/15 0623 07/23/15 0505 07/10/2015 0452 07/06/2015 0609  WBC 15.8* 14.1* 15.2* 13.7* 19.6*  HGB 11.1* 11.2* 11.2* 10.5* 10.2*  HCT 33.5* 34.6* 33.9* 31.9* 31.8*  PLT 215 229 218 221 230  MCV 94.8 94.8 94.6 94.5 93.7  MCH 31.2 30.9 31.3 31.2 30.1  MCHC 33.0 32.6 33.1 33.0 32.1  RDW 15.8* 15.6* 15.5* 15.6* 15.5*  LYMPHSABS 0.7* 0.9* 0.6* 0.6* 0.5*  MONOABS 1.0* 1.1* 1.1*  0.9 1.1*  EOSABS 0.0 0.4 0.5 0.4 0.0  BASOSABS 0.1 0.1 0.1 0.0 0.1   ------------------------------------------------------------------------------------------------------------------  Chemistries   Recent Labs Lab 07/20/15 0615 07/21/15 0251 07/22/15 0850 07/23/15 0505 07/10/2015 0452 07/26/2015 0609 07/26/15 0445  NA 138 139 136 138 139 140  --   K 3.9 3.5 3.5 4.0 4.0 3.7  --   CL 103 106 101 101 104 107  --   CO2 '30 27 27 29 29 25  '$ --   GLUCOSE 132* 151* 141* 142* 115* 143*  --   BUN '12 15 10 9 8 12  '$ --   CREATININE 0.75 0.85 0.80 0.76 0.75 0.83 0.97  CALCIUM 8.8* 9.3 8.8* 9.2 9.0 9.3  --   AST 16  --   --   --   --   --   --   ALT 17  --   --   --   --   --   --   ALKPHOS 79  --   --   --   --   --   --   BILITOT 0.4  --   --   --   --   --   --    ------------------------------------------------------------------------------------------------------------------ estimated creatinine clearance is 58.1 mL/min (by C-G formula based on Cr of 0.97). ------------------------------------------------------------------------------------------------------------------ No results for input(s): TSH, T4TOTAL, T3FREE, THYROIDAB in the last 72 hours.  Invalid input(s): FREET3   Coagulation profile No results for input(s): INR, PROTIME in the last 168 hours. ------------------------------------------------------------------------------------------------------------------- No results for input(s): DDIMER in the last 72 hours. -------------------------------------------------------------------------------------------------------------------  Cardiac Enzymes No results for input(s): CKMB, TROPONINI, MYOGLOBIN in the last 168 hours.  Invalid input(s): CK ------------------------------------------------------------------------------------------------------------------ Invalid input(s):  POCBNP  ---------------------------------------------------------------------------------------------------------------  Urinalysis    Component Value Date/Time   COLORURINE Amber 09/05/2012 0756   APPEARANCEUR Cloudy 09/05/2012 0756   LABSPEC 1.031 09/05/2012 0756   PHURINE 5.0 09/05/2012 0756   GLUCOSEU Negative 09/05/2012 0756   HGBUR 2+ 09/05/2012 0756   BILIRUBINUR Negative 09/05/2012 0756   KETONESUR Negative 09/05/2012 0756   PROTEINUR 30 mg/dL 09/05/2012 0756   NITRITE Negative 09/05/2012 0756   LEUKOCYTESUR Negative 09/05/2012 0756     RADIOLOGY: Dg Chest Port 1 View  07/26/2015  CLINICAL DATA:  Respiratory failure. History of lung and breast cancer. EXAM: PORTABLE CHEST 1 VIEW COMPARISON:  07/22/2015 FINDINGS: Left-sided injectable central venous catheter is stable. Cardiomediastinal silhouette is normal. Mediastinal contours appear intact. There is no evidence of pneumothorax. Dense left upper lobe airspace  consolidation/pulmonary mass is stable in appearance. There also remains a patchy airspace consolidation in the right upper lobe. No definite evidence of pleural effusion. Osseous structures are without acute abnormality. There has been a prior right mastectomy. IMPRESSION: No significant change in the appearance of the lungs with dense left upper lobe airspace consolidation and/or pulmonary mass, and patchy airspace consolidation in the right upper lobe. Electronically Signed   By: Fidela Salisbury M.D.   On: 07/26/2015 09:31    IMPRESSION AND PLAN:  * Acute respiratory failure with hypoxia  Left upper lobe lung cancer, radiation pneumonitis and bilateral pneumonia  BAL is negative 07/22/2015)   On broad-spectrum antibiotic coverage and infectious diseases and pulmonology is following the patient.  Scheduled DuoNeb and IV steroid   * Diabetes   On glipizide and Insulin sliding coverage.  * Sinus tachycardia   Likely due to Hypoxia.  Monitor for now- BP  stable.   If does not improve with Oxygen, may need further cardiac meds.  * Lung cancer   Oncology on case.  * Hyperlipidemia   Zocor.  All the records are reviewed and case discussed with ED provider. Management plans discussed with the patient, family and they are in agreement.  CODE STATUS:    Code Status Orders        Start     Ordered   07/10/2015 1138  Full code   Continuous     07/28/2015 1137       TOTAL TIME TAKING CARE OF THIS PATIENT: 50 minutes.  The patient's causing and his wife and daughter are present in the room during my encounter with the patient and she was okay with the presence.  Vaughan Basta M.D on 07/26/2015   Between 7am to 6pm - Pager - [phone]  After 6pm go to [email or web address] - password [email or web address] [place] [facility]  Office  [phone]  CC: Primary care physician; [name], MD   Note: This dictation was prepared with Dragon dictation along with smaller phrase technology. Any transcriptional errors that result from this process are unintentional.

## 2015-07-26 NOTE — Progress Notes (Signed)
Pt is on HFNC. Tolerating well. Bipap on SB

## 2015-07-26 NOTE — Progress Notes (Signed)
Patient on HFNC , having trouble breathing. Respiratory therapist increased HFNC, patient not improving. MD paged several times, without call back. Called rapid response due to elevated heart rate and patient working so hard to breathe on 70L at 100% on HFNC. Patient transferred to CCU. Report given to [facility].

## 2015-07-27 DIAGNOSIS — J9621 Acute and chronic respiratory failure with hypoxia: Secondary | ICD-10-CM

## 2015-07-27 DIAGNOSIS — R0600 Dyspnea, unspecified: Secondary | ICD-10-CM

## 2015-07-27 DIAGNOSIS — C3431 Malignant neoplasm of lower lobe, right bronchus or lung: Secondary | ICD-10-CM | POA: Diagnosis not present

## 2015-07-27 DIAGNOSIS — J700 Acute pulmonary manifestations due to radiation: Secondary | ICD-10-CM | POA: Diagnosis not present

## 2015-07-27 DIAGNOSIS — C3412 Malignant neoplasm of upper lobe, left bronchus or lung: Secondary | ICD-10-CM | POA: Diagnosis not present

## 2015-07-27 DIAGNOSIS — J90 Pleural effusion, not elsewhere classified: Secondary | ICD-10-CM | POA: Diagnosis not present

## 2015-07-27 DIAGNOSIS — J189 Pneumonia, unspecified organism: Secondary | ICD-10-CM | POA: Diagnosis not present

## 2015-07-27 LAB — BASIC METABOLIC PANEL
Anion gap: 8 (ref 5–15)
BUN: 15 mg/dL (ref 6–20)
CO2: 27 mmol/L (ref 22–32)
Calcium: 9.5 mg/dL (ref 8.9–10.3)
Chloride: 100 mmol/L — ABNORMAL LOW (ref 101–111)
Creatinine, Ser: 1 mg/dL (ref 0.44–1.00)
GFR calc Af Amer: >60 mL/min (ref >60)
GFR calc non Af Amer: >60 mL/min (ref >60)
Glucose, Bld: 203 mg/dL — ABNORMAL HIGH (ref 65–99)
Potassium: 3.7 mmol/L (ref 3.5–5.1)
Sodium: 135 mmol/L (ref 135–145)

## 2015-07-27 LAB — CBC WITH DIFFERENTIAL/PLATELET
Basophils Absolute: 0 10*3/uL (ref 0–0.1)
Basophils Relative: 0 %
Eosinophils Absolute: 0 10*3/uL (ref 0–0.7)
Eosinophils Relative: 0 %
HCT: 31.9 % — ABNORMAL LOW (ref 35.0–47.0)
Hemoglobin: 10.3 g/dL — ABNORMAL LOW (ref 12.0–16.0)
Lymphocytes Relative: 2 %
Lymphs Abs: 0.3 10*3/uL — ABNORMAL LOW (ref 1.0–3.6)
MCH: 30.2 pg (ref 26.0–34.0)
MCHC: 32.4 g/dL (ref 32.0–36.0)
MCV: 93.4 fL (ref 80.0–100.0)
Monocytes Absolute: 0.7 10*3/uL (ref 0.2–0.9)
Monocytes Relative: 4 %
Neutro Abs: 15.2 10*3/uL — ABNORMAL HIGH (ref 1.4–6.5)
Neutrophils Relative %: 94 %
Platelets: 237 10*3/uL (ref 150–440)
RBC: 3.41 MIL/uL — ABNORMAL LOW (ref 3.80–5.20)
RDW: 15.7 % — ABNORMAL HIGH (ref 11.5–14.5)
WBC: 16.3 10*3/uL — ABNORMAL HIGH (ref 3.6–11.0)

## 2015-07-27 LAB — GLUCOSE, CAPILLARY
GLUCOSE-CAPILLARY: 179 mg/dL — AB (ref 65–99)
GLUCOSE-CAPILLARY: 198 mg/dL — AB (ref 65–99)
GLUCOSE-CAPILLARY: 199 mg/dL — AB (ref 65–99)
GLUCOSE-CAPILLARY: 166 mg/dL — AB (ref 65–99)

## 2015-07-27 MED ORDER — LEVALBUTEROL HCL 1.25 MG/0.5ML IN NEBU
1.2500 mg | INHALATION_SOLUTION | Freq: Four times a day (QID) | RESPIRATORY_TRACT | Status: DC
  Administered 2015-07-27 (×3): 1.25 mg via RESPIRATORY_TRACT
  Filled 2015-07-27 (×3): qty 0.5

## 2015-07-27 MED ORDER — LEVALBUTEROL HCL 1.25 MG/0.5ML IN NEBU
1.2500 mg | INHALATION_SOLUTION | RESPIRATORY_TRACT | Status: DC | PRN
  Administered 2015-08-02 – 2015-08-04 (×3): 1.25 mg via RESPIRATORY_TRACT
  Filled 2015-07-27 (×3): qty 0.5

## 2015-07-27 MED ORDER — NYSTATIN 100000 UNIT/ML MT SUSP
5.0000 mL | Freq: Four times a day (QID) | OROMUCOSAL | Status: DC
  Administered 2015-07-27 – 2015-08-06 (×27): 500000 [IU] via OROMUCOSAL
  Filled 2015-07-27 (×29): qty 5

## 2015-07-27 NOTE — Progress Notes (Signed)
[place] at [place] NAME: Brittney Lam    MR#:  [id number]  DATE OF BIRTH:  Nov 25, 1960  SUBJECTIVE:  Came in with increasing sob and low sats  REVIEW OF SYSTEMS:   Review of Systems  Constitutional: Negative for fever, chills and weight loss.  HENT: Negative for ear discharge, ear pain and nosebleeds.   Eyes: Negative for blurred vision, pain and discharge.  Respiratory: Positive for cough, sputum production and shortness of breath. Negative for wheezing and stridor.   Cardiovascular: Negative for chest pain, palpitations, orthopnea and PND.  Gastrointestinal: Negative for nausea, vomiting, abdominal pain and diarrhea.  Genitourinary: Negative for urgency and frequency.  Musculoskeletal: Negative for back pain and joint pain.  Neurological: Positive for weakness. Negative for sensory change, speech change and focal weakness.  Psychiatric/Behavioral: Negative for depression and hallucinations. The patient is not nervous/anxious.   All other systems reviewed and are negative.  Tolerating Diet:yes Tolerating PT: not seen yet since on HFNC  DRUG ALLERGIES:   Allergies  Allergen Reactions  . Metformin Diarrhea and Nausea Only  . Other Rash    Sage Wipes    VITALS:  Blood pressure 141/81, pulse 105, temperature 98.6 F (37 C), temperature source Oral, resp. rate 27, height '5\' 2"'$  (1.575 m), weight 144 lb (65.318 kg), SpO2 94 %.  PHYSICAL EXAMINATION:   Physical Exam  GENERAL:  54 y.o.-year-old patient lying in the bed with mild to meoderate acute distress.  EYES: Pupils equal, round, reactive to light and accommodation. No scleral icterus. Extraocular muscles intact.  HEENT: Head atraumatic, normocephalic. Oropharynx and nasopharynx clear.  NECK:  Supple, no jugular venous distention. No thyroid enlargement, no tenderness.  LUNGS: distsnt breath sounds bilaterally, rales, rhonchi. No use of accessory muscles of respiration.  Scattered wheezing CARDIOVASCULAR: S1, S2 normal. No murmurs, rubs, or gallops. Mild tachycardia ABDOMEN: Soft, nontender, nondistended. Bowel sounds present. No organomegaly or mass.  EXTREMITIES: No cyanosis, clubbing or edema b/l.    NEUROLOGIC: Cranial nerves II through XII are intact. No focal Motor or sensory deficits b/l.   PSYCHIATRIC: The patient is alert and oriented x 3.  SKIN: No obvious rash, lesion, or ulcer.    LABORATORY PANEL:   CBC  Recent Labs Lab 07/27/15 0537  WBC 16.3*  HGB 10.3*  HCT 31.9*  PLT 237    Chemistries   Recent Labs Lab 07/27/15 0537  NA 135  K 3.7  CL 100*  CO2 27  GLUCOSE 203*  BUN 15  CREATININE 1.00  CALCIUM 9.5    Cardiac Enzymes No results for input(s): TROPONINI in the last 168 hours.  RADIOLOGY:  Dg Chest Port 1 View  07/26/2015  CLINICAL DATA:  Respiratory failure. History of lung and breast cancer. EXAM: PORTABLE CHEST 1 VIEW COMPARISON:  07/22/2015 FINDINGS: Left-sided injectable central venous catheter is stable. Cardiomediastinal silhouette is normal. Mediastinal contours appear intact. There is no evidence of pneumothorax. Dense left upper lobe airspace consolidation/pulmonary mass is stable in appearance. There also remains a patchy airspace consolidation in the right upper lobe. No definite evidence of pleural effusion. Osseous structures are without acute abnormality. There has been a prior right mastectomy. IMPRESSION: No significant change in the appearance of the lungs with dense left upper lobe airspace consolidation and/or pulmonary mass, and patchy airspace consolidation in the right upper lobe. Electronically Signed   By: Fidela Salisbury M.D.   On: 07/26/2015 09:31     ASSESSMENT AND PLAN:  Brittney Lam is a 54 y.o. female with a known history of hypertension, diabetes, lung cancer in left upper lobe and in past had breast cancer status post radiation and surgery- admitted for last few days under oncology  service for respiratory distress and is being treated for possible radiation pneumonitis or pneumonia.  * Acute respiratory failure with hypoxia Left upper lobe lung cancer, radiation pneumonitis and bilateral pneumonia BAL is negative 07/16/2015)  On broad-spectrum antibiotic coverage and infectious diseases and pulmonology input is appreciated Scheduled Neb and IV steroid -HFNC and Bipap prn Pt is full code(readdressed)  * Diabetes  On glipizide and Insulin sliding coverage.  * Sinus tachycardia  Likely due to Hypoxia. Monitor for now- BP stable.  * Lung cancer Last chemo and radiation several weeks ago  * Hyperlipidemia  Zocor.  Case discussed with Care Management/Social Worker. Management plans discussed with the patient, family and they are in agreement.  CODE STATUS: full  DVT Prophylaxis: lovenox  TOTAL critical TIME TAKING CARE OF THIS PATIENT: 35 minutes.  >50% time spent on counselling and coordination of care  POSSIBLE D/C IN fewDAYS, DEPENDING ON CLINICAL CONDITION.   Jordana Dugue M.D on 07/27/2015 at 11:22 AM  Between 7am to 6pm - Pager - [phone]  After 6pm go to [email or web address] - password [email or web address] [facility]  [facility]  Office  [phone]  CC: Primary care physician; [name], MD

## 2015-07-27 NOTE — Progress Notes (Signed)
   07/27/15 1731  Vitals  Pulse Rate (!) 133  ECG Heart Rate (!) 134  Resp (!) 29  Oxygen Therapy  SpO2 (!) 75 %  pt talking in room with family loudly, family asked to visit tomorrow to let pt rest, [place] RN camered in to also assess pt, nursing will cont to monitor

## 2015-07-27 NOTE — Progress Notes (Signed)
Pt's HR increases and O2 sat drops into the Low 80's when pt starts a breathing treatment. Pt is not in distress but does require extra time to recover from breathing treatment. Pt is on continuous HFNC so O2 sat should not change so drastically.

## 2015-07-27 NOTE — Progress Notes (Signed)
[place] Progress Note Patient Name: DENNETTE FAULCONER DOB: 1961/08/25 MRN: [id number]   Date of Service  07/27/2015  HPI/Events of Note  Drops sats with minimal exertion such as use of bed pan.   eICU Interventions  Will place Foley Catheter to minimize exertion and episodes of desaturation.      Intervention Category Intermediate Interventions: Respiratory distress - evaluation and management  Lysle Dingwall 07/27/2015, 7:36 PM

## 2015-07-27 NOTE — Progress Notes (Signed)
ANTIBIOTIC CONSULT NOTE - Follow up  Pharmacy Consult for Zosyn/Vancomycin Dosing Indication: rule out pneumonia  Allergies  Allergen Reactions  . Metformin Diarrhea and Nausea Only  . Other Rash    Sage Wipes   Patient Measurements: Height: '5\' 2"'$  (157.5 cm) Weight: 144 lb (65.318 kg) IBW/kg (Calculated) : 50.1 Adjusted Body Weight: 56 kg  Vital Signs: Temp: 98.6 F (37 C) (11/26 0000) Temp Source: Oral (11/26 0000) BP: 141/81 mmHg (11/26 0800) Pulse Rate: 105 (11/26 0804) Intake/Output from previous day: 11/25 0701 - 11/26 0700 In: 2000 [I.V.:1150; IV Piggyback:850] Out: 2250 [Urine:2250] Intake/Output from this shift: Total I/O In: 50 [I.V.:50] Out: -   Labs:  Recent Labs  07/26/15 0445 07/27/15 0537  WBC  --  16.3*  HGB  --  10.3*  PLT  --  237  CREATININE 0.97 1.00   Estimated Creatinine Clearance: 56.4 mL/min (by C-G formula based on Cr of 1).  Recent Labs  07/10/2015 2213 07/26/15 1208  VANCOTROUGH 14 28*     Microbiology: Recent Results (from the past 720 hour(s))  Culture, sputum-assessment     Status: None   Collection Time: 07/20/15  7:33 AM  Result Value Ref Range Status   Specimen Description EXPECTORATED SPUTUM  Final   Special Requests NONE  Final   Sputum evaluation   Final    Sputum specimen not acceptable for testing.  Please recollect.   Results Called to: [facility] PARDINI AT 8242 ON 07/20/15 CTJ    Report Status 07/20/2015 FINAL  Final  Fungus Culture with Smear     Status: None (Preliminary result)   Collection Time: 07/10/2015  9:58 AM  Result Value Ref Range Status   Specimen Description BRONCHIAL WASHINGS  Final   Special Requests NONE  Final   Culture NO GROWTH 2 DAYS  Final   Report Status PENDING  Incomplete  Culture, respiratory (NON-Expectorated)     Status: None   Collection Time: 07/25/2015  9:58 AM  Result Value Ref Range Status   Specimen Description BRONCHIAL WASHINGS  Final   Special Requests WASHING RIGHT  Final   Culture Consistent with normal respiratory flora.  Final   Report Status 07/26/2015 FINAL  Final  MRSA PCR Screening     Status: Abnormal   Collection Time: 07/26/15  9:01 AM  Result Value Ref Range Status   MRSA by PCR POSITIVE (A) NEGATIVE Final    Comment:        The GeneXpert MRSA Assay (FDA approved for NASAL specimens only), is one component of a comprehensive MRSA colonization surveillance program. It is not intended to diagnose MRSA infection nor to guide or monitor treatment for MRSA infections. CRITICAL RESULT CALLED TO, READ BACK BY AND VERIFIED WITH: KENDRA SIMSER ON 07/26/15 AT 1036 BY QSD     Assessment: Pharmacy consulted to dose vancomycin and Zosyn for 54 yo female being treated for left upper lobe PNA. ID consulted, azithromycin added 11/22, course completed today.   Trough 11/25 at 1208 supratherapeutic at 28. Patients Scr has gradually increased from 0.75 on 9/23  to 1.0 today.   Today is day 8 of Abx treatment.    Goal of Therapy:  Vancomycin trough level 15-20 mcg/ml  Plan:  Continue zosyn 3.375gm IV Q8H extended infusion. Stop date 07/29/15.   Will continue Vancomycin regimen to 1g every 12 hours. Stop date  0000 on 07/29/15 per Dr. Stevenson Clinch and ID treatment plan of 10 days.   Recommend not checking trough unless significant  increase in WBC, 50% increase in Scr or clinical worsening. Scr today slight increase from 0.97 to 1.0.     Pharmacy will continue to monitor  renal function and labs and make adjustments as needed.    Nancy Fetter, PharmD Pharmacy Resident 07/27/2015 11:01 AM

## 2015-07-27 NOTE — Consult Note (Signed)
PULMONARY / CRITICAL CARE MEDICINE   Name: Brittney Lam MRN: [id number] DOB: [birth year]    ADMISSION DATE:  07/28/2015 CONSULTATION DATE:  07/26/15 REFERRING MD :  Dr. Rogue Bussing   CHIEF COMPLAINT:     Shortness of breath/pneumonia/radiation pneumonitis   HISTORY OF PRESENT ILLNESS   54 y.o. female with past history of DM HTN presented to the ED with increased shortness of breath cough and congestion on 11/21. Patient was diagnosed with recurrence of LUL cancer and has been undergoing chemotherapy for this. Patient has finished RT and chemo. Now presented with increased shortness of breath cough and congestion. Patient has had significant weakness noted also. Patient has had pinkish sputum noted. CT scan done showed changes of consolidation with airspace disease felt initially to be due to radiation pneumonitis and she was started on prednisone as an outpatient. Patient did not seem to have improvement noted. Patient was admitted as it was felt that there may be an underlying infectious process. She had a bronchoscopy with BAL on 11-23, cultures negative to date, today on the medical floor noted to have increased FiO2 requirement with saturations down today's and was transferred to the ICU for further monitoring.   SUBJECTIVE: Patient states that the nebulizers are making her more short of breath, anxious, increased heart rate. Tolerated BiPAP well overnight with good saturations, this morning with activity, desaturated down to the 80s, and had to be placed back on BiPAP  SIGNIFICANT EVENTS   11/23>>bronchscopy with BAL by Dr. Humphrey Rolls  PAST MEDICAL HISTORY    :  Past Medical History  Diagnosis Date  . Unspecified essential hypertension   . Kidney failure   . Personal history of tobacco use, presenting hazards to health   . Cataract   . Personal history of malignant neoplasm of breast   . Breast screening, unspecified   . Special screening for malignant neoplasms, colon   .  Diabetes mellitus without complication ([place])   . MDRO (multiple drug resistant organisms) resistance   . H. pylori duodenitis   . Complication of anesthesia     BP DROPPED DURING LOBECTOMY IN 2014  . Cancer [facility]) 2001    left breast cancer s/p L/SN/R in 2001  . Cancer [facility]) 2014    right breast invasive CA, Er pos,Pr neg, Her 2 neg.  . Lung cancer, upper lobe ([place]) 2014    right upper lobe  . Breast cancer [facility])    Past Surgical History  Procedure Laterality Date  . Intercostal nerve block  2005  . Cataract extraction w/ intraocular lens implant  2005  . Carpal tunnel release Right 2011  . Insertion central venous access device w/ subcutaneous port  12-07-12  . Breast lumpectomy Left 2001  . Breast surgery Right 2014    total mastectomy  . Right lung upper lobectomy  03/2013  . Lobectomy Right   . Cataract extraction    . Mastectomy Right   . Colonoscopy    . Endobronchial ultrasound N/A 02/05/2015    Procedure: ENDOBRONCHIAL ULTRASOUND;  Surgeon: Flora Lipps, MD;  Location: [facility] ORS;  Service: Cardiopulmonary;  Laterality: N/A;  . Flexible bronchoscopy N/A 07/28/2015    Procedure: FLEXIBLE BRONCHOSCOPY;  Surgeon: Allyne Gee, MD;  Location: [facility] ORS;  Service: Pulmonary;  Laterality: N/A;   Prior to Admission medications   Medication Sig Start Date End Date Taking? Authorizing Provider  acetaminophen (TYLENOL) 325 MG tablet Take 650 mg by mouth every 6 (six) hours as needed for pain.  Yes Historical Provider, MD  calcium citrate-vitamin D (CITRACAL+D) 315-200 MG-UNIT tablet Take 1 tablet by mouth 2 (two) times daily.   Yes Historical Provider, MD  glipiZIDE (GLUCOTROL XL) 5 MG 24 hr tablet Take by mouth. 09/20/14 09/20/15 Yes Historical Provider, MD  hydrochlorothiazide (HYDRODIURIL) 25 MG tablet Take 12.5 mg by mouth daily.    Yes Historical Provider, MD  letrozole (FEMARA) 2.5 MG tablet Take 2.5 mg by mouth daily.   Yes Historical Provider, MD  Multiple Vitamins-Minerals  (MULTIVITAMIN PO) Take by mouth.   Yes Historical Provider, MD  naproxen sodium (ANAPROX) 220 MG tablet Take 220 mg by mouth every 8 (eight) hours as needed.   Yes Historical Provider, MD  ondansetron (ZOFRAN) 4 MG tablet Take 1 tablet (4 mg total) by mouth every 6 (six) hours as needed for nausea or vomiting. 03/06/15  Yes Forest Gleason, MD  potassium chloride SA (K-DUR,KLOR-CON) 20 MEQ tablet Take 1 tablet (20 mEq total) by mouth daily. 05/08/15  Yes Forest Gleason, MD  simvastatin (ZOCOR) 10 MG tablet Take 10 mg by mouth daily.   Yes Historical Provider, MD  esomeprazole (NEXIUM) 20 MG capsule Take 1 capsule (20 mg total) by mouth every morning. Patient not taking: Reported on 07/31/2015 03/27/15   Noreene Filbert, MD  gabapentin (NEURONTIN) 300 MG capsule Take 300 mg by mouth as needed.    Historical Provider, MD  nicotine polacrilex (NICORETTE) 2 MG gum Take 2 mg by mouth 5 (five) times daily.     Historical Provider, MD  PARoxetine Mesylate (BRISDELLE PO) Take 1 tablet by mouth at bedtime.    Historical Provider, MD  venlafaxine (EFFEXOR) 37.5 MG tablet Take 1 tablet (37.5 mg total) by mouth daily. Patient not taking: Reported on 07/28/2015 04/17/15   Noreene Filbert, MD   Allergies  Allergen Reactions  . Metformin Diarrhea and Nausea Only  . Other Rash    Sage Wipes     FAMILY HISTORY   Family History  Problem Relation Age of Onset  . Diabetes Other   . Hyperlipidemia Other   . Hypertension Other       SOCIAL HISTORY    reports that she quit smoking about 2 years ago. Her smoking use included Cigarettes. She has a 15 pack-year smoking history. She has never used smokeless tobacco. She reports that she does not drink alcohol or use illicit drugs.  Review of Systems  Constitutional: Negative for fever, chills and weight loss.  HENT: Negative for hearing loss.   Respiratory: Positive for cough, sputum production and shortness of breath.        Pinkish sputum, intermittent small  specks of blood tinge  Cardiovascular: Positive for palpitations. Negative for chest pain, claudication and leg swelling.  Gastrointestinal: Negative for heartburn, nausea and vomiting.  Genitourinary: Negative for dysuria.  Neurological: Negative for headaches.  Endo/Heme/Allergies: Does not bruise/bleed easily.  Psychiatric/Behavioral: Negative for depression.      VITAL SIGNS    Temp:  [98.6 F (37 C)-99.4 F (37.4 C)] 98.6 F (37 C) (11/26 0000) Pulse Rate:  [79-138] 105 (11/26 0804) Resp:  [17-37] 27 (11/26 0804) BP: (98-147)/(65-92) 141/81 mmHg (11/26 0800) SpO2:  [78 %-99 %] 94 % (11/26 0804) FiO2 (%):  [50 %-100 %] 100 % (11/26 0804) HEMODYNAMICS:   VENTILATOR SETTINGS: Vent Mode:  [-]  FiO2 (%):  [50 %-100 %] 100 % INTAKE / OUTPUT:  Intake/Output Summary (Last 24 hours) at 07/27/15 1884 Last data filed at 07/27/15 0800  Gross per  24 hour  Intake   2050 ml  Output   2250 ml  Net   -200 ml       PHYSICAL EXAM   Physical Exam  Constitutional: She is oriented to person, place, and time. She appears well-developed and well-nourished.  Moderate respiratory distress on BiPAP  HENT:  Head: Normocephalic and atraumatic.  Right Ear: External ear normal.  Left Ear: External ear normal.  Eyes: Conjunctivae and EOM are normal. Pupils are equal, round, and reactive to light.  Neck: Normal range of motion. Neck supple.  Cardiovascular: Normal heart sounds and intact distal pulses.   No murmur heard. Pulmonary/Chest: She is in respiratory distress. She has wheezes.  On BiPAP, no use of SCM, mild diffuse wheeze.  Abdominal: Soft. Bowel sounds are normal. She exhibits no distension.  Musculoskeletal: Normal range of motion.  Neurological: She is alert and oriented to person, place, and time.  Skin: Skin is warm and dry.  Nursing note and vitals reviewed.      LABS   LABS:  CBC  Recent Labs Lab 07/15/2015 0452 07/30/2015 0609 07/27/15 0537  WBC 13.7*  19.6* 16.3*  HGB 10.5* 10.2* 10.3*  HCT 31.9* 31.8* 31.9*  PLT 221 230 237   Coag's No results for input(s): APTT, INR in the last 168 hours. BMET  Recent Labs Lab 07/23/2015 0452 07/05/2015 0609 07/26/15 0445 07/27/15 0537  NA 139 140  --  135  K 4.0 3.7  --  3.7  CL 104 107  --  100*  CO2 29 25  --  27  BUN 8 12  --  15  CREATININE 0.75 0.83 0.97 1.00  GLUCOSE 115* 143*  --  203*   Electrolytes  Recent Labs Lab 07/02/2015 0452 07/22/2015 0609 07/27/15 0537  CALCIUM 9.0 9.3 9.5   Sepsis Markers No results for input(s): LATICACIDVEN, PROCALCITON, O2SATVEN in the last 168 hours. ABG No results for input(s): PHART, PCO2ART, PO2ART in the last 168 hours. Liver Enzymes No results for input(s): AST, ALT, ALKPHOS, BILITOT, ALBUMIN in the last 168 hours. Cardiac Enzymes No results for input(s): TROPONINI, PROBNP in the last 168 hours. Glucose  Recent Labs Lab 07/25/15 2117 07/26/15 0734 07/26/15 1155 07/26/15 1650 07/26/15 2201 07/27/15 0701  GLUCAP 229* 112* 87 115* 217* 179*     Recent Results (from the past 240 hour(s))  Culture, sputum-assessment     Status: None   Collection Time: 07/20/15  7:33 AM  Result Value Ref Range Status   Specimen Description EXPECTORATED SPUTUM  Final   Special Requests NONE  Final   Sputum evaluation   Final    Sputum specimen not acceptable for testing.  Please recollect.   Results Called to: [facility] PARDINI AT 0258 ON 07/20/15 CTJ    Report Status 07/20/2015 FINAL  Final  Fungus Culture with Smear     Status: None (Preliminary result)   Collection Time: 07/13/2015  9:58 AM  Result Value Ref Range Status   Specimen Description BRONCHIAL WASHINGS  Final   Special Requests NONE  Final   Culture NO GROWTH 2 DAYS  Final   Report Status PENDING  Incomplete  Culture, respiratory (NON-Expectorated)     Status: None   Collection Time: 07/10/2015  9:58 AM  Result Value Ref Range Status   Specimen Description BRONCHIAL WASHINGS  Final    Special Requests WASHING RIGHT  Final   Culture Consistent with normal respiratory flora.  Final   Report Status 07/26/2015 FINAL  Final  MRSA PCR Screening     Status: Abnormal   Collection Time: 07/26/15  9:01 AM  Result Value Ref Range Status   MRSA by PCR POSITIVE (A) NEGATIVE Final    Comment:        The GeneXpert MRSA Assay (FDA approved for NASAL specimens only), is one component of a comprehensive MRSA colonization surveillance program. It is not intended to diagnose MRSA infection nor to guide or monitor treatment for MRSA infections. CRITICAL RESULT CALLED TO, READ BACK BY AND VERIFIED WITH: KENDRA SIMSER ON 07/26/15 AT 1036 BY QSD      Current facility-administered medications:  .  0.9 %  sodium chloride infusion, , Intravenous, Continuous, Evlyn Kanner, NP, Last Rate: 50 mL/hr at 07/27/15 0800 .  acetaminophen (TYLENOL) tablet 650 mg, 650 mg, Oral, Q4H PRN, Evlyn Kanner, NP, 650 mg at 07/22/15 1328 .  [COMPLETED] azithromycin (ZITHROMAX) tablet 500 mg, 500 mg, Oral, Daily, 500 mg at 07/23/15 1545 **FOLLOWED BY** azithromycin (ZITHROMAX) tablet 250 mg, 250 mg, Oral, Daily, Travarius Lange, MD, 250 mg at 07/26/15 1119 .  butamben-tetracaine-benzocaine (CETACAINE) spray 1 spray, 1 spray, Topical, Once, Allyne Gee, MD .  calcium citrate-vitamin D 500-400 MG-UNIT per chewable tablet 1 tablet, 1 tablet, Oral, BID, Evlyn Kanner, NP, 1 tablet at 07/26/15 2309 .  Chlorhexidine Gluconate Cloth 2 % PADS 6 each, 6 each, Topical, Q0600, Vilinda Boehringer, MD, 6 each at 07/26/15 1145 .  enoxaparin (LOVENOX) injection 100 mg, 1.5 mg/kg, Subcutaneous, Q24H, Cammie Sickle, MD, 100 mg at 07/26/15 2001 .  feeding supplement (ENSURE ENLIVE) (ENSURE ENLIVE) liquid 237 mL, 237 mL, Oral, BID BM, Berenise Hunton, MD, 237 mL at 07/26/15 1500 .  glipiZIDE (GLUCOTROL XL) 24 hr tablet 5 mg, 5 mg, Oral, Q breakfast, Evlyn Kanner, NP, 5 mg at 07/25/15 0902 .   guaiFENesin-dextromethorphan (ROBITUSSIN DM) 100-10 MG/5ML syrup 10 mL, 10 mL, Oral, Q4H PRN, Evlyn Kanner, NP, 10 mL at 07/25/2015 2235 .  hydrochlorothiazide (HYDRODIURIL) tablet 12.5 mg, 12.5 mg, Oral, Daily, Evlyn Kanner, NP, 12.5 mg at 07/26/15 1118 .  HYDROcodone-acetaminophen (NORCO/VICODIN) 5-325 MG per tablet 1-2 tablet, 1-2 tablet, Oral, Q4H PRN, Evlyn Kanner, NP, 1 tablet at 07/26/2015 1116 .  insulin aspart (novoLOG) injection 0-15 Units, 0-15 Units, Subcutaneous, TID WC, Cammie Sickle, MD, 5 Units at 07/25/15 1700 .  insulin aspart (novoLOG) injection 0-5 Units, 0-5 Units, Subcutaneous, QHS, Cammie Sickle, MD, 2 Units at 07/26/15 2203 .  letrozole [facility]) tablet 2.5 mg, 2.5 mg, Oral, Daily, Evlyn Kanner, NP, 2.5 mg at 07/26/15 1120 .  levalbuterol (XOPENEX) nebulizer solution 1.25 mg, 1.25 mg, Nebulization, 4 times per day, Analyse Angst, MD .  levalbuterol (XOPENEX) nebulizer solution 1.25 mg, 1.25 mg, Nebulization, Q4H PRN, Demetrus Pavao, MD .  lidocaine (XYLOCAINE) 2 % jelly 1 application, 1 application, Topical, Once, Allyne Gee, MD .  methylPREDNISolone sodium succinate (SOLU-MEDROL) 125 mg/2 mL injection 60 mg, 60 mg, Intravenous, 3 times per day, 60 mg at 07/27/15 0541 **FOLLOWED BY** [START ON 07/30/2015] predniSONE (DELTASONE) tablet 40 mg, 40 mg, Oral, Q breakfast, Dajuana Palen, MD .  morphine 2 MG/ML injection 1 mg, 1 mg, Intravenous, Q6H PRN, Kody Brandl, MD, 1 mg at 07/26/15 1120 .  mupirocin ointment (BACTROBAN) 2 % 1 application, 1 application, Nasal, BID, Vilinda Boehringer, MD, 1 application at 49/67/59 2148 .  ondansetron (ZOFRAN) injection 4 mg, 4 mg, Intravenous, Q6H PRN, Cammie Sickle, MD, 4  mg at 07/20/15 0121 .  pantoprazole (PROTONIX) EC tablet 40 mg, 40 mg, Oral, Daily, Evlyn Kanner, NP, 40 mg at 07/26/15 1118 .  PARoxetine (PAXIL) tablet 10 mg, 10 mg, Oral, QHS, Evlyn Kanner, NP, 10 mg at 07/26/15 2147 .  phenylephrine  (NEO-SYNEPHRINE) 0.25 % nasal spray 1 spray, 1 spray, Each Nare, Q6H PRN, Allyne Gee, MD .  piperacillin-tazobactam (ZOSYN) IVPB 3.375 g, 3.375 g, Intravenous, 3 times per day, Vilinda Boehringer, MD, 3.375 g at 07/27/15 0318 .  potassium chloride SA (K-DUR,KLOR-CON) CR tablet 20 mEq, 20 mEq, Oral, Daily, Evlyn Kanner, NP, 20 mEq at 07/26/15 1119 .  senna-docusate (Senokot-S) tablet 1 tablet, 1 tablet, Oral, QHS PRN, Evlyn Kanner, NP .  simvastatin (ZOCOR) tablet 10 mg, 10 mg, Oral, Daily, Evlyn Kanner, NP, 10 mg at 07/26/15 2144 .  sodium chloride 0.9 % injection 10 mL, 10 mL, Intravenous, PRN, Cammie Sickle, MD, 10 mL at 07/22/15 2010 .  vancomycin (VANCOCIN) IVPB 1000 mg/200 mL premix, 1,000 mg, Intravenous, Q12H, Sheema M Hallaji, RPH, 1,000 mg at 07/27/15 0019  Facility-Administered Medications Ordered in Other Encounters:  .  sodium chloride 0.9 % injection 10 mL, 10 mL, Intracatheter, PRN, Forest Gleason, MD, 10 mL at 02/04/15 1027  IMAGING    Dg Chest Port 1 View  07/26/2015  CLINICAL DATA:  Respiratory failure. History of lung and breast cancer. EXAM: PORTABLE CHEST 1 VIEW COMPARISON:  07/22/2015 FINDINGS: Left-sided injectable central venous catheter is stable. Cardiomediastinal silhouette is normal. Mediastinal contours appear intact. There is no evidence of pneumothorax. Dense left upper lobe airspace consolidation/pulmonary mass is stable in appearance. There also remains a patchy airspace consolidation in the right upper lobe. No definite evidence of pleural effusion. Osseous structures are without acute abnormality. There has been a prior right mastectomy. IMPRESSION: No significant change in the appearance of the lungs with dense left upper lobe airspace consolidation and/or pulmonary mass, and patchy airspace consolidation in the right upper lobe. Electronically Signed   By: Fidela Salisbury M.D.   On: 07/26/2015 09:31      Indwelling Urinary Catheter  continued, requirement due to   Reason to continue Indwelling Urinary Catheter for strict Intake/Output monitoring for hemodynamic instability   Central Line continued, requirement due to   Reason to continue [employer] Monitoring of central venous pressure or other hemodynamic parameters   Ventilator continued, requirement due to, resp failure    Ventilator Sedation RASS 0 to -2   Cultures: BCx2  UC  Sputum BAL 11/23>>  Antibiotics: Vanc 11/18>>11/29 Zosyn 11/18>>11/29 Azithro 11/22>>11/28 Lines:   ASSESSMENT/PLAN  54 year old female with bilateral pneumonia, recurrent left upper lobe lung cancer, shortness of breath, admitted to the ICU for respiratory distress and hypoxia  PULMONARY Respiratory failure-hypoxia Recurrent left upper lobe lung cancer Radiation pneumonitis Bilateral pneumonia  P:   - Continue with BiPAP, wean as tolerated, may give breaks on HFNC -Maintain O2 saturations greater than 88% -Vancomycin/zosyn for 10 days total, a Zithromax and for 5 days total -stop duonebs due to anxiety/agitatino, start xopenox -Change oral steroids to IV, 60 every 63 days then transition back to prednisone 40 mg daily until outpatient follow-up with pulmonologist -Chest x-ray with left upper lobe consolidation, reviewing the images also fits with radiation pneumonitis. -Status post bronchoscopy with BAL on 11/23 - cultures negative to date -10 days total of antibiotics -Patient does have a mild anxiety component with her respiratory distress, 1 mg morphine IV every  6 hours for shortness of breath and chest pain 3 days -Lung cancer/stage III status post chemoradiation, finished 04/10/2015; clinically patient fits time window for radiation pneumonitis and will treat as such  CARDIOVASCULAR -Continue to monitor hemodynamic status   RENAL -Monitor electrolytes -ICU to try protocol replacement  GASTROINTESTINAL SUP - PPI  HEMATOLOGIC History of breast  cancer Stage III left upper lobe lung cancer -Status post chemoradiation 04/10/2015 -Hematology/oncology following -Continue with monitoring of CBC   INFECTIOUS Bilateral pneumonia -Continue with current antibiotics, for a total of 10 days -Continue to follow-up BAL cultures   ENDOCRINE -ssi  NEUROLOGIC RASS goal: 0   I have personally obtained a history, examined the patient, evaluated laboratory and imaging results, formulated the assessment and plan and placed orders.  The Patient requires high complexity decision making for assessment and support, frequent evaluation and titration of therapies, application of advanced monitoring technologies and extensive interpretation of multiple databases.   Critical Care Time devoted to patient care services described in this note is 40 minutes.   Overall, patient is critically ill, prognosis is guarded. Patient at high risk for cardiac arrest and death.   Vilinda Boehringer, MD [facility] Pager [phone] (please enter 7-digits) On Call Pager [phone] (please enter 7-digits)     07/27/2015, 8:32 AM  Note: This note was prepared with Dragon dictation along with smaller phrase technology. Any transcriptional errors that result from this process are unintentional.

## 2015-07-27 NOTE — Progress Notes (Signed)
[facility] Hematology/Oncology Progress Note  Date of admission: 07/21/2015  Hospital day:  07/27/2015  Chief Complaint: Brittney Lam is a 54 y.o. female with locally advanced LUL lung cancer who was admitted with left upper lobe and right lower lobe consolidation.  Subjective:  Episode of respiratory decompensation with nebulized treatment this AM.  Utilizing BiPAP alternating with high flow nasal cannulae.  Breathing more comfortably now.   Social History: The patient is accompanied by her husband today.  Allergies:  Allergies  Allergen Reactions  . Metformin Diarrhea and Nausea Only  . Other Rash    Sage Wipes    Scheduled Medications: . butamben-tetracaine-benzocaine  1 spray Topical Once  . calcium citrate-vitamin D  1 tablet Oral BID  . Chlorhexidine Gluconate Cloth  6 each Topical Q0600  . enoxaparin (LOVENOX) injection  1.5 mg/kg Subcutaneous Q24H  . feeding supplement (ENSURE ENLIVE)  237 mL Oral BID BM  . glipiZIDE  5 mg Oral Q breakfast  . hydrochlorothiazide  12.5 mg Oral Daily  . insulin aspart  0-15 Units Subcutaneous TID WC  . insulin aspart  0-5 Units Subcutaneous QHS  . letrozole  2.5 mg Oral Daily  . levalbuterol  1.25 mg Nebulization 4 times per day  . lidocaine  1 application Topical Once  . [START ON 07/30/2015] predniSONE  40 mg Oral Q breakfast   Followed by  . methylPREDNISolone (SOLU-MEDROL) injection  60 mg Intravenous 3 times per day  . mupirocin ointment  1 application Nasal BID  . pantoprazole  40 mg Oral Daily  . PARoxetine  10 mg Oral QHS  . piperacillin-tazobactam (ZOSYN)  IV  3.375 g Intravenous 3 times per day  . potassium chloride SA  20 mEq Oral Daily  . simvastatin  10 mg Oral Daily  . vancomycin  1,000 mg Intravenous Q12H    Review of Systems: GENERAL:  Fatigue.  No fevers or sweats. PERFORMANCE STATUS (ECOG):  3 HEENT:  No visual changes, runny nose, sore throat, mouth sores or tenderness. Lungs:Shortness  of breath, improved.  Cough.  Wheezing.  No hemoptysis. Cardiac:  No chest pain, palpitations, orthopnea, or PND. GI:  No nausea, vomiting, diarrhea, constipation, melena or hematochezia. GU:  No urgency, frequency, dysuria, or hematuria. Musculoskeletal:  No back pain.  No joint pain.  No muscle tenderness. Extremities:  No pain or swelling. Skin:  No rashes or skin changes. Neuro:  Generalized weakness.  No headache, numbness or weakness, balance or coordination issues. Endocrine:  Diabetes.  No thyroid issues, hot flashes or night sweats. Psych:  Anxiety.  No mood changes or depression. Pain:  No focal pain. Review of systems:  All other systems reviewed and found to be negative.  Physical Exam: Blood pressure 134/87, pulse 117, temperature 98.3 F (36.8 C), temperature source Oral, resp. rate 33, height '5\' 2"'  (1.575 m), weight 144 lb (65.318 kg), SpO2 86 %.  GENERAL:  Well developed, well nourished, woman sitting up in mild respiratory distress in the ICU. MENTAL STATUS:  Alert and oriented to person, place and time. HEAD:  Wearing a black head wrap. Normocephalic, atraumatic, face symmetric, no Cushingoid features. EYES:  Brown eyes.  Pupils equal round and reactive to light and accomodation.  No conjunctivitis or scleral icterus. ENT:  BiPAP in place.  Thrush.  Tongue normal. Mucous membranes moist.  RESPIRATORY:  Diffuse wheezes (right > left).  No rhonchi. CARDIOVASCULAR:  Regular rate and rhythm without murmur, rub or gallop. ABDOMEN:  Soft,  non-tender, with active bowel sounds, and no appreciable hepatosplenomegaly.  No masses. SKIN:  No rashes, ulcers or lesions. EXTREMITIES: Mild left upper extremity edema.  No lower extremity edema, no skin discoloration or tenderness.  No palpable cords. LYMPH NODES: No palpable cervical, supraclavicular, axillary or inguinal adenopathy  NEUROLOGICAL: Unremarkable. PSYCH:  Appropriate.  Results for orders placed or performed during the  hospital encounter of 07/04/2015 (from the past 48 hour(s))  Glucose, capillary     Status: Abnormal   Collection Time: 07/25/15  4:30 PM  Result Value Ref Range   Glucose-Capillary 204 (H) 65 - 99 mg/dL  Glucose, capillary     Status: Abnormal   Collection Time: 07/25/15  9:17 PM  Result Value Ref Range   Glucose-Capillary 229 (H) 65 - 99 mg/dL  Creatinine, serum     Status: None   Collection Time: 07/26/15  4:45 AM  Result Value Ref Range   Creatinine, Ser 0.97 0.44 - 1.00 mg/dL   GFR calc non Af Amer >60 >60 mL/min   GFR calc Af Amer >60 >60 mL/min    Comment: (NOTE) The eGFR has been calculated using the CKD EPI equation. This calculation has not been validated in all clinical situations. eGFR's persistently <60 mL/min signify possible Chronic Kidney Disease.   Glucose, capillary     Status: Abnormal   Collection Time: 07/26/15  7:34 AM  Result Value Ref Range   Glucose-Capillary 112 (H) 65 - 99 mg/dL  MRSA PCR Screening     Status: Abnormal   Collection Time: 07/26/15  9:01 AM  Result Value Ref Range   MRSA by PCR POSITIVE (A) NEGATIVE    Comment:        The GeneXpert MRSA Assay (FDA approved for NASAL specimens only), is one component of a comprehensive MRSA colonization surveillance program. It is not intended to diagnose MRSA infection nor to guide or monitor treatment for MRSA infections. CRITICAL RESULT CALLED TO, READ BACK BY AND VERIFIED WITH: KENDRA SIMSER ON 07/26/15 AT 1036 BY QSD   Glucose, capillary     Status: None   Collection Time: 07/26/15 11:55 AM  Result Value Ref Range   Glucose-Capillary 87 65 - 99 mg/dL  Vancomycin, trough     Status: Abnormal   Collection Time: 07/26/15 12:08 PM  Result Value Ref Range   Vancomycin Tr 28 (HH) 10 - 20 ug/mL    Comment: CRITICAL RESULT CALLED TO, READ BACK BY AND VERIFIED WITH MARY SWAYNE ON 07/26/15 AT [address] [place]   Glucose, capillary     Status: Abnormal   Collection Time: 07/26/15  4:50 PM  Result Value Ref  Range   Glucose-Capillary 115 (H) 65 - 99 mg/dL  Glucose, capillary     Status: Abnormal   Collection Time: 07/26/15 10:01 PM  Result Value Ref Range   Glucose-Capillary 217 (H) 65 - 99 mg/dL  CBC with Differential     Status: Abnormal   Collection Time: 07/27/15  5:37 AM  Result Value Ref Range   WBC 16.3 (H) 3.6 - 11.0 K/uL   RBC 3.41 (L) 3.80 - 5.20 MIL/uL   Hemoglobin 10.3 (L) 12.0 - 16.0 g/dL   HCT 31.9 (L) 35.0 - 47.0 %   MCV 93.4 80.0 - 100.0 fL   MCH 30.2 26.0 - 34.0 pg   MCHC 32.4 32.0 - 36.0 g/dL   RDW 15.7 (H) 11.5 - 14.5 %   Platelets 237 150 - 440 K/uL   Neutrophils Relative % 94 %  Neutro Abs 15.2 (H) 1.4 - 6.5 K/uL   Lymphocytes Relative 2 %   Lymphs Abs 0.3 (L) 1.0 - 3.6 K/uL   Monocytes Relative 4 %   Monocytes Absolute 0.7 0.2 - 0.9 K/uL   Eosinophils Relative 0 %   Eosinophils Absolute 0.0 0 - 0.7 K/uL   Basophils Relative 0 %   Basophils Absolute 0.0 0 - 0.1 K/uL  Basic metabolic panel     Status: Abnormal   Collection Time: 07/27/15  5:37 AM  Result Value Ref Range   Sodium 135 135 - 145 mmol/L   Potassium 3.7 3.5 - 5.1 mmol/L   Chloride 100 (L) 101 - 111 mmol/L   CO2 27 22 - 32 mmol/L   Glucose, Bld 203 (H) 65 - 99 mg/dL   BUN 15 6 - 20 mg/dL   Creatinine, Ser 1.00 0.44 - 1.00 mg/dL   Calcium 9.5 8.9 - 10.3 mg/dL   GFR calc non Af Amer >60 >60 mL/min   GFR calc Af Amer >60 >60 mL/min    Comment: (NOTE) The eGFR has been calculated using the CKD EPI equation. This calculation has not been validated in all clinical situations. eGFR's persistently <60 mL/min signify possible Chronic Kidney Disease.    Anion gap 8 5 - 15  Glucose, capillary     Status: Abnormal   Collection Time: 07/27/15  7:01 AM  Result Value Ref Range   Glucose-Capillary 179 (H) 65 - 99 mg/dL  Glucose, capillary     Status: Abnormal   Collection Time: 07/27/15 11:10 AM  Result Value Ref Range   Glucose-Capillary 198 (H) 65 - 99 mg/dL   Dg Chest Port 1 View  07/26/2015   CLINICAL DATA:  Respiratory failure. History of lung and breast cancer. EXAM: PORTABLE CHEST 1 VIEW COMPARISON:  07/22/2015 FINDINGS: Left-sided injectable central venous catheter is stable. Cardiomediastinal silhouette is normal. Mediastinal contours appear intact. There is no evidence of pneumothorax. Dense left upper lobe airspace consolidation/pulmonary mass is stable in appearance. There also remains a patchy airspace consolidation in the right upper lobe. No definite evidence of pleural effusion. Osseous structures are without acute abnormality. There has been a prior right mastectomy. IMPRESSION: No significant change in the appearance of the lungs with dense left upper lobe airspace consolidation and/or pulmonary mass, and patchy airspace consolidation in the right upper lobe. Electronically Signed   By: Fidela Salisbury M.D.   On: 07/26/2015 09:31    Assessment:  LAQUONDA WELBY is a 54 y.o. female with locally advanced  LUL lung cancer s/p concurrent chemotherapy and radiation admitted with left upper lobe and right lower lobe consolidation.  She has been on broad spectrum antibiotics.  She underwent bronchoscopy on 07/25/2015.  Samples negative to date.  Oral steroids added 07/25/2015, but increased to solumedrol 60 mg IV q 8 hours today with respiratory decline requiring admission to ICU on BIPAP.  Plan: 1. Hematology/Oncology:   Initial T1bN0M0 RUL carcinoma s/p RUL lobectomy.  LUL mass with mediastinal adnopathy (T1N1M0) biopsy positive poorly differentiated adenocarcinoma c/w lung primary.  Completed 6 cycles of carbo/Taxol with radiation on 04/10/2015.   No current evidence of progressive disease.    History of stage IIIA right breast cancer (T3N1M0) s/p neoadjuvant chemotherapy followed by chest wall radiation (completed 10/21/2013) and letrozole (began 10/24/2013).  Per patient, she has received radiation on 3 separate occasions.  Given deterioration, treating aggressively for  radiation pneumonitis with IV steroids x 3 days then transition back to  oral prednisone.  CXR yesterday no significant change.    No evidence of PE on chest CT angiogram.  Patient has left axillary DVT on Lovenox.    2. Pulmonary:  In ICU on BiPAP alternating with high flow nasal cannulae.  CXR yesterday stable.  Treatment initiated for radiation pneumonitis (solumedrol 60 mg IV q 8 hours x 3 days; began 07/26/2015).  Follow-up bronchoscopy results from 07/23/2015.  Negative to date.  On Zosyn, vancomycin, and azithromycin. Xopenex every 4 hours prn.  Morphine 1 mg q 6 hours prn SOB.  Appreciate Dr. Merian Capron assistance over holiday weekend.  Further intervention (transbronhial biopsy/EBUS) next week per Dr. Humphrey Rolls if patient does not clinically improve.  3.  Infectious disease:  On Zosyn, vancomycin, and azithromycin.  All cultures negative to date.  Vancomycin levels adjusted by pharmacy.  Nystatin swish and spit for thrush.  4.  Fluids/electrolytes and nutrition:  Sliding scale insulin for high dose steroids.  BMP daily.   Lequita Asal, MD  07/27/2015, 2:28 PM

## 2015-07-28 DIAGNOSIS — R0600 Dyspnea, unspecified: Secondary | ICD-10-CM | POA: Diagnosis not present

## 2015-07-28 DIAGNOSIS — J15212 Pneumonia due to Methicillin resistant Staphylococcus aureus: Secondary | ICD-10-CM | POA: Diagnosis not present

## 2015-07-28 DIAGNOSIS — J90 Pleural effusion, not elsewhere classified: Secondary | ICD-10-CM | POA: Diagnosis not present

## 2015-07-28 DIAGNOSIS — C3431 Malignant neoplasm of lower lobe, right bronchus or lung: Secondary | ICD-10-CM | POA: Diagnosis not present

## 2015-07-28 DIAGNOSIS — R5381 Other malaise: Secondary | ICD-10-CM

## 2015-07-28 DIAGNOSIS — J9601 Acute respiratory failure with hypoxia: Secondary | ICD-10-CM | POA: Diagnosis not present

## 2015-07-28 DIAGNOSIS — J700 Acute pulmonary manifestations due to radiation: Secondary | ICD-10-CM | POA: Diagnosis not present

## 2015-07-28 DIAGNOSIS — J189 Pneumonia, unspecified organism: Secondary | ICD-10-CM | POA: Diagnosis not present

## 2015-07-28 DIAGNOSIS — C3412 Malignant neoplasm of upper lobe, left bronchus or lung: Secondary | ICD-10-CM | POA: Diagnosis not present

## 2015-07-28 LAB — CBC WITH DIFFERENTIAL/PLATELET
Basophils Absolute: 0 10*3/uL (ref 0–0.1)
Basophils Relative: 0 %
Eosinophils Absolute: 0 10*3/uL (ref 0–0.7)
Eosinophils Relative: 0 %
HCT: 31.8 % — ABNORMAL LOW (ref 35.0–47.0)
Hemoglobin: 10.4 g/dL — ABNORMAL LOW (ref 12.0–16.0)
Lymphocytes Relative: 2 %
Lymphs Abs: 0.4 10*3/uL — ABNORMAL LOW (ref 1.0–3.6)
MCH: 30.1 pg (ref 26.0–34.0)
MCHC: 32.6 g/dL (ref 32.0–36.0)
MCV: 92.3 fL (ref 80.0–100.0)
Monocytes Absolute: 1 10*3/uL — ABNORMAL HIGH (ref 0.2–0.9)
Monocytes Relative: 5 %
Neutro Abs: 19.8 10*3/uL — ABNORMAL HIGH (ref 1.4–6.5)
Neutrophils Relative %: 93 %
Platelets: 264 10*3/uL (ref 150–440)
RBC: 3.45 MIL/uL — ABNORMAL LOW (ref 3.80–5.20)
RDW: 15.5 % — ABNORMAL HIGH (ref 11.5–14.5)
WBC: 21.3 10*3/uL — ABNORMAL HIGH (ref 3.6–11.0)

## 2015-07-28 LAB — BASIC METABOLIC PANEL
Anion gap: 6 (ref 5–15)
BUN: 20 mg/dL (ref 6–20)
CO2: 30 mmol/L (ref 22–32)
Calcium: 9.5 mg/dL (ref 8.9–10.3)
Chloride: 101 mmol/L (ref 101–111)
Creatinine, Ser: 0.96 mg/dL (ref 0.44–1.00)
GFR calc Af Amer: >60 mL/min (ref >60)
GFR calc non Af Amer: >60 mL/min (ref >60)
Glucose, Bld: 162 mg/dL — ABNORMAL HIGH (ref 65–99)
Potassium: 3.6 mmol/L (ref 3.5–5.1)
Sodium: 137 mmol/L (ref 135–145)

## 2015-07-28 LAB — GLUCOSE, CAPILLARY
GLUCOSE-CAPILLARY: 128 mg/dL — AB (ref 65–99)
GLUCOSE-CAPILLARY: 205 mg/dL — AB (ref 65–99)
GLUCOSE-CAPILLARY: 79 mg/dL (ref 65–99)
GLUCOSE-CAPILLARY: 230 mg/dL — AB (ref 65–99)

## 2015-07-28 MED ORDER — CALCIUM CITRATE 950 (200 CA) MG PO TABS
200.0000 mg | ORAL_TABLET | Freq: Every day | ORAL | Status: DC
  Filled 2015-07-28: qty 1

## 2015-07-28 NOTE — Progress Notes (Signed)
[facility] Hematology/Oncology Progress Note  Date of admission: 07/07/2015  Hospital day:  07/28/2015  Chief Complaint: Brittney Lam is a 54 y.o. female with locally advanced LUL lung cancer who was admitted with left upper lobe and right lower lobe consolidation.  Subjective:  Breathing a little bit more comfortably today.   Social History: The patient is alone today.  Allergies:  Allergies  Allergen Reactions  . Metformin Diarrhea and Nausea Only  . Other Rash    Sage Wipes    Scheduled Medications: . butamben-tetracaine-benzocaine  1 spray Topical Once  . calcium citrate-vitamin D  1 tablet Oral BID  . Chlorhexidine Gluconate Cloth  6 each Topical Q0600  . enoxaparin (LOVENOX) injection  1.5 mg/kg Subcutaneous Q24H  . feeding supplement (ENSURE ENLIVE)  237 mL Oral BID BM  . glipiZIDE  5 mg Oral Q breakfast  . hydrochlorothiazide  12.5 mg Oral Daily  . insulin aspart  0-15 Units Subcutaneous TID WC  . insulin aspart  0-5 Units Subcutaneous QHS  . letrozole  2.5 mg Oral Daily  . lidocaine  1 application Topical Once  . [START ON 07/30/2015] predniSONE  40 mg Oral Q breakfast   Followed by  . methylPREDNISolone (SOLU-MEDROL) injection  60 mg Intravenous 3 times per day  . mupirocin ointment  1 application Nasal BID  . nystatin  5 mL Mouth/Throat QID  . pantoprazole  40 mg Oral Daily  . PARoxetine  10 mg Oral QHS  . piperacillin-tazobactam (ZOSYN)  IV  3.375 g Intravenous 3 times per day  . potassium chloride SA  20 mEq Oral Daily  . simvastatin  10 mg Oral Daily  . vancomycin  1,000 mg Intravenous Q12H    Review of Systems: GENERAL:  Fatigue.  No fevers or sweats. PERFORMANCE STATUS (ECOG):  3 HEENT:  No visual changes, runny nose, sore throat, mouth sores or tenderness. Lungs: Shortness of breath, improved.  Cough.  Wheezing, improved.  No hemoptysis. Cardiac:  No chest pain, palpitations, orthopnea, or PND. GI:  No nausea, vomiting,  diarrhea, constipation, melena or hematochezia. GU:  No urgency, frequency, dysuria, or hematuria. Musculoskeletal:  No back pain.  No joint pain.  No muscle tenderness. Extremities:  No pain or swelling. Skin:  No rashes or skin changes. Neuro:  Generalized weakness.  No headache, numbness or weakness, balance or coordination issues. Endocrine:  Diabetes.  No thyroid issues, hot flashes or night sweats. Psych:  Anxiety.  No mood changes or depression. Pain:  No focal pain. Review of systems:  All other systems reviewed and found to be negative.  Physical Exam: Blood pressure 137/78, pulse 67, temperature 98.4 F (36.9 C), temperature source Oral, resp. rate 20, height '5\' 2"'  (1.575 m), weight 144 lb (65.318 kg), SpO2 94 %.  GENERAL:  Well developed, well nourished, woman sitting up in mild respiratory distress in the ICU. MENTAL STATUS:  Alert and oriented to person, place and time. HEAD:  Normocephalic, atraumatic, face symmetric, no Cushingoid features. EYES:  Brown eyes.  Pupils equal round and reactive to light and accomodation.  No conjunctivitis or scleral icterus. ENT:  BiPAP in place.  Right buccal mucosa thrush, improved.  Tongue normal. Mucous membranes moist.  RESPIRATORY:  Diffuse wheezes (right > left), improved.  No rhonchi. CARDIOVASCULAR:  Regular rate and rhythm without murmur, rub or gallop. ABDOMEN:  Soft, non-tender, with active bowel sounds, and no appreciable hepatosplenomegaly.  No masses. SKIN:  No rashes, ulcers or lesions. EXTREMITIES:  Mild left upper extremity edema.  No lower extremity edema, no skin discoloration or tenderness.  No palpable cords. LYMPH NODES: No palpable cervical, supraclavicular, axillary or inguinal adenopathy  NEUROLOGICAL: Unremarkable. PSYCH:  Appropriate.  Results for orders placed or performed during the hospital encounter of 07/08/2015 (from the past 48 hour(s))  Glucose, capillary     Status: Abnormal   Collection Time: 07/26/15   4:50 PM  Result Value Ref Range   Glucose-Capillary 115 (H) 65 - 99 mg/dL  Glucose, capillary     Status: Abnormal   Collection Time: 07/26/15 10:01 PM  Result Value Ref Range   Glucose-Capillary 217 (H) 65 - 99 mg/dL  CBC with Differential     Status: Abnormal   Collection Time: 07/27/15  5:37 AM  Result Value Ref Range   WBC 16.3 (H) 3.6 - 11.0 K/uL   RBC 3.41 (L) 3.80 - 5.20 MIL/uL   Hemoglobin 10.3 (L) 12.0 - 16.0 g/dL   HCT 31.9 (L) 35.0 - 47.0 %   MCV 93.4 80.0 - 100.0 fL   MCH 30.2 26.0 - 34.0 pg   MCHC 32.4 32.0 - 36.0 g/dL   RDW 15.7 (H) 11.5 - 14.5 %   Platelets 237 150 - 440 K/uL   Neutrophils Relative % 94 %   Neutro Abs 15.2 (H) 1.4 - 6.5 K/uL   Lymphocytes Relative 2 %   Lymphs Abs 0.3 (L) 1.0 - 3.6 K/uL   Monocytes Relative 4 %   Monocytes Absolute 0.7 0.2 - 0.9 K/uL   Eosinophils Relative 0 %   Eosinophils Absolute 0.0 0 - 0.7 K/uL   Basophils Relative 0 %   Basophils Absolute 0.0 0 - 0.1 K/uL  Basic metabolic panel     Status: Abnormal   Collection Time: 07/27/15  5:37 AM  Result Value Ref Range   Sodium 135 135 - 145 mmol/L   Potassium 3.7 3.5 - 5.1 mmol/L   Chloride 100 (L) 101 - 111 mmol/L   CO2 27 22 - 32 mmol/L   Glucose, Bld 203 (H) 65 - 99 mg/dL   BUN 15 6 - 20 mg/dL   Creatinine, Ser 1.00 0.44 - 1.00 mg/dL   Calcium 9.5 8.9 - 10.3 mg/dL   GFR calc non Af Amer >60 >60 mL/min   GFR calc Af Amer >60 >60 mL/min    Comment: (NOTE) The eGFR has been calculated using the CKD EPI equation. This calculation has not been validated in all clinical situations. eGFR's persistently <60 mL/min signify possible Chronic Kidney Disease.    Anion gap 8 5 - 15  Glucose, capillary     Status: Abnormal   Collection Time: 07/27/15  7:01 AM  Result Value Ref Range   Glucose-Capillary 179 (H) 65 - 99 mg/dL  Glucose, capillary     Status: Abnormal   Collection Time: 07/27/15 11:10 AM  Result Value Ref Range   Glucose-Capillary 198 (H) 65 - 99 mg/dL  Glucose,  capillary     Status: Abnormal   Collection Time: 07/27/15  4:22 PM  Result Value Ref Range   Glucose-Capillary 199 (H) 65 - 99 mg/dL  Glucose, capillary     Status: Abnormal   Collection Time: 07/27/15  9:30 PM  Result Value Ref Range   Glucose-Capillary 166 (H) 65 - 99 mg/dL  CBC with Differential     Status: Abnormal   Collection Time: 07/28/15  5:41 AM  Result Value Ref Range   WBC 21.3 (H) 3.6 -  11.0 K/uL   RBC 3.45 (L) 3.80 - 5.20 MIL/uL   Hemoglobin 10.4 (L) 12.0 - 16.0 g/dL   HCT 31.8 (L) 35.0 - 47.0 %   MCV 92.3 80.0 - 100.0 fL   MCH 30.1 26.0 - 34.0 pg   MCHC 32.6 32.0 - 36.0 g/dL   RDW 15.5 (H) 11.5 - 14.5 %   Platelets 264 150 - 440 K/uL   Neutrophils Relative % 93 %   Neutro Abs 19.8 (H) 1.4 - 6.5 K/uL   Lymphocytes Relative 2 %   Lymphs Abs 0.4 (L) 1.0 - 3.6 K/uL   Monocytes Relative 5 %   Monocytes Absolute 1.0 (H) 0.2 - 0.9 K/uL   Eosinophils Relative 0 %   Eosinophils Absolute 0.0 0 - 0.7 K/uL   Basophils Relative 0 %   Basophils Absolute 0.0 0 - 0.1 K/uL  Basic metabolic panel     Status: Abnormal   Collection Time: 07/28/15  5:41 AM  Result Value Ref Range   Sodium 137 135 - 145 mmol/L   Potassium 3.6 3.5 - 5.1 mmol/L   Chloride 101 101 - 111 mmol/L   CO2 30 22 - 32 mmol/L   Glucose, Bld 162 (H) 65 - 99 mg/dL   BUN 20 6 - 20 mg/dL   Creatinine, Ser 0.96 0.44 - 1.00 mg/dL   Calcium 9.5 8.9 - 10.3 mg/dL   GFR calc non Af Amer >60 >60 mL/min   GFR calc Af Amer >60 >60 mL/min    Comment: (NOTE) The eGFR has been calculated using the CKD EPI equation. This calculation has not been validated in all clinical situations. eGFR's persistently <60 mL/min signify possible Chronic Kidney Disease.    Anion gap 6 5 - 15  Glucose, capillary     Status: Abnormal   Collection Time: 07/28/15  7:12 AM  Result Value Ref Range   Glucose-Capillary 128 (H) 65 - 99 mg/dL  Glucose, capillary     Status: Abnormal   Collection Time: 07/28/15 10:58 AM  Result Value Ref  Range   Glucose-Capillary 205 (H) 65 - 99 mg/dL   No results found.  Assessment:  Brittney Lam is a 54 y.o. female with locally advanced  LUL lung cancer s/p concurrent chemotherapy and radiation admitted with left upper lobe and right lower lobe consolidation.  She has been on broad spectrum antibiotics.  She underwent bronchoscopy on 07/04/2015.  Samples negative to date.  Oral steroids added 07/25/2015, but increased to solumedrol 60 mg IV q 8 hours today with respiratory decline requiring admission to ICU on BIPAP.  Plan: 1. Hematology/Oncology:   Initial T1bN0M0 RUL carcinoma s/p RUL lobectomy.  LUL mass with mediastinal adnopathy (T1N1M0) biopsy positive poorly differentiated adenocarcinoma c/w lung primary.  Completed 6 cycles of carbo/Taxol with radiation on 04/10/2015.   No current evidence of progressive disease.    History of stage IIIA right breast cancer (T3N1M0) s/p neoadjuvant chemotherapy followed by chest wall radiation (completed 10/21/2013) and letrozole (began 10/24/2013).  Per patient, she has received radiation on 3 separate occasions.  Given deterioration, treating aggressively for radiation pneumonitis with IV steroids x 3 days then transition back to oral prednisone.  CXR on 07/26/2015 no significant change.    No evidence of PE on chest CT angiogram.  Patient has left axillary DVT on Lovenox.    2. Pulmonary:  In ICU on high flow nasal cannulae.  CXR on 07/26/2015 was stable.  Treatment for radiation pneumonitis (solumedrol 60 mg  IV q 8 hours x 3 days; began 07/26/2015).  Follow-up bronchoscopy results from 07/09/2015 remains negative to date (normal flora).  On Zosyn, vancomycin, and azithromycin. Xopenex every 4 hours prn.  Morphine 1 mg q 6 hours prn SOB.  Appreciate Dr. Merian Capron assistance over holiday weekend.  Further intervention (transbronhial biopsy/EBUS) next week per Dr. Humphrey Rolls if patient does not clinically improve.  3.  Infectious disease:  MRSA screen + (on  isolation).  On Zosyn, vancomycin, and azithromycin.  All cultures negative to date (normal flora).  Vancomycin levels adjusted by pharmacy.  Nystatin swish and spit for thrush, improved.  4.  Fluids/electrolytes and nutrition:  Sliding scale insulin for high dose steroids.  BMP daily.   Lequita Asal, MD  07/28/2015, 12:42 PM

## 2015-07-28 NOTE — Progress Notes (Signed)
[place] [facility] [place] NAME: Brittney Lam    MR#:  [id number]  DATE OF BIRTH:  May 09, 1961  SUBJECTIVE:  Came in with increasing sob and low sats Feeling better today. On high flow nasal cannula 80% Sats 91-93  REVIEW OF SYSTEMS:   Review of Systems  Constitutional: Negative for fever, chills and weight loss.  HENT: Negative for ear discharge, ear pain and nosebleeds.   Eyes: Negative for blurred vision, pain and discharge.  Respiratory: Positive for cough, sputum production and shortness of breath. Negative for wheezing and stridor.   Cardiovascular: Negative for chest pain, palpitations, orthopnea and PND.  Gastrointestinal: Negative for nausea, vomiting, abdominal pain and diarrhea.  Genitourinary: Negative for urgency and frequency.  Musculoskeletal: Negative for back pain and joint pain.  Neurological: Positive for weakness. Negative for sensory change, speech change and focal weakness.  Psychiatric/Behavioral: Negative for depression and hallucinations. The patient is not nervous/anxious.   All other systems reviewed and are negative.  Tolerating Diet:yes Tolerating PT: not seen yet since on HFNC  DRUG ALLERGIES:   Allergies  Allergen Reactions  . Metformin Diarrhea and Nausea Only  . Other Rash    Sage Wipes    VITALS:  Blood pressure 137/78, pulse 67, temperature 98.4 F (36.9 C), temperature source Oral, resp. rate 20, height '5\' 2"'$  (1.575 m), weight 144 lb (65.318 kg), SpO2 94 %.  PHYSICAL EXAMINATION:   Physical Exam  GENERAL:  54 y.o.-year-old patient lying in the bed with mild to meoderate acute distress.  EYES: Pupils equal, round, reactive to light and accommodation. No scleral icterus. Extraocular muscles intact.  HEENT: Head atraumatic, normocephalic. Oropharynx and nasopharynx clear.  NECK:  Supple, no jugular venous distention. No thyroid enlargement, no tenderness.  LUNGS: distsnt breath sounds  bilaterally, rales, rhonchi. No use of accessory muscles of respiration. Scattered wheezing CARDIOVASCULAR: S1, S2 normal. No murmurs, rubs, or gallops. Mild tachycardia ABDOMEN: Soft, nontender, nondistended. Bowel sounds present. No organomegaly or mass.  EXTREMITIES: No cyanosis, clubbing or edema b/l.    NEUROLOGIC: Cranial nerves II through XII are intact. No focal Motor or sensory deficits b/l.   PSYCHIATRIC: The patient is alert and oriented x 3.  SKIN: No obvious rash, lesion, or ulcer.    LABORATORY PANEL:   CBC  Recent Labs Lab 07/28/15 0541  WBC 21.3*  HGB 10.4*  HCT 31.8*  PLT 264    Chemistries   Recent Labs Lab 07/28/15 0541  NA 137  K 3.6  CL 101  CO2 30  GLUCOSE 162*  BUN 20  CREATININE 0.96  CALCIUM 9.5    Cardiac Enzymes No results for input(s): TROPONINI in the last 168 hours.  RADIOLOGY:  No results found.   ASSESSMENT AND PLAN:  Brittney Lam is a 54 y.o. 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  * Acute respiratory failure with hypoxia Left upper lobe lung cancer, radiation pneumonitis and bilateral pneumonia BAL is negative 07/04/2015)  On broad-spectrum antibiotic coverage with IV vanc and zosyn Scheduled Neb and IV steroid (high doses for possible radiation pneumonitis) -HFNC and Bipap prn Pt is full code(readdressed)  * Diabetes  On glipizide and Insulin sliding coverage.  * Sinus tachycardia  Likely due to Hypoxia.  *  Lung cancer Last chemo and radiation several weeks ago  * Hyperlipidemia  Zocor.  Case discussed with Care Management/Social Worker. Management plans discussed with the patient, family and they are in agreement.  CODE STATUS: full  DVT  Prophylaxis: lovenox  TOTAL critical TIME TAKING CARE OF THIS PATIENT: 35 minutes.  >50% time spent on counselling and coordination of care  POSSIBLE D/C IN fewDAYS, DEPENDING ON CLINICAL CONDITION.   [name] M.D on 07/28/2015 at 11:40 AM  Between 7am to 6pm - Pager - [phone]  After 6pm go to [email or web address] - password [email or web address] [facility]  [facility]  Office  [phone]  CC: Primary care physician; [name], MD

## 2015-07-28 NOTE — Consult Note (Signed)
PULMONARY / CRITICAL CARE MEDICINE   Name: Brittney Lam MRN: [id number] DOB: October 26, 1960    ADMISSION DATE:  07/31/2015 CONSULTATION DATE:  07/26/15 REFERRING MD :  Dr. Rogue Bussing   CHIEF COMPLAINT:     Shortness of breath/pneumonia/radiation pneumonitis   HISTORY OF PRESENT ILLNESS   54 y.o. female with past history of DM HTN presented to the ED with increased shortness of breath cough and congestion on 11/21. Patient was diagnosed with recurrence of LUL cancer and has been undergoing chemotherapy for this. Patient has finished RT and chemo. Now presented with increased shortness of breath cough and congestion. Patient has had significant weakness noted also. Patient has had pinkish sputum noted. CT scan done showed changes of consolidation with airspace disease felt initially to be due to radiation pneumonitis and she was started on prednisone as an outpatient. Patient did not seem to have improvement noted. Patient was admitted as it was felt that there may be an underlying infectious process. She had a bronchoscopy with BAL on 11-23, cultures negative to date, today on the medical floor noted to have increased FiO2 requirement with saturations down today's and was transferred to the ICU for further monitoring.   SUBJECTIVE: Nebulizers causing rebound anxiety and subsequent bronchospasms, they have now been switched to only as needed. Patient tolerating high flow nasal cannula and feels she is having improvement in her respiratory status.  SIGNIFICANT EVENTS   11/23>>bronchscopy with BAL by Dr. Humphrey Rolls  PAST MEDICAL HISTORY    :  Past Medical History  Diagnosis Date  . Unspecified essential hypertension   . Kidney failure   . Personal history of tobacco use, presenting hazards to health   . Cataract   . Personal history of malignant neoplasm of breast   . Breast screening, unspecified   . Special screening for malignant neoplasms, colon   . Diabetes mellitus without  complication ([place])   . MDRO (multiple drug resistant organisms) resistance   . H. pylori duodenitis   . Complication of anesthesia     BP DROPPED DURING LOBECTOMY IN 2014  . Cancer [facility]) 2001    left breast cancer s/p L/SN/R in 2001  . Cancer [facility]) 2014    right breast invasive CA, Er pos,Pr neg, Her 2 neg.  . Lung cancer, upper lobe ([place]) 2014    right upper lobe  . Breast cancer [facility])    Past Surgical History  Procedure Laterality Date  . Intercostal nerve block  2005  . Cataract extraction w/ intraocular lens implant  2005  . Carpal tunnel release Right 2011  . Insertion central venous access device w/ subcutaneous port  12-07-12  . Breast lumpectomy Left 2001  . Breast surgery Right 2014    total mastectomy  . Right lung upper lobectomy  03/2013  . Lobectomy Right   . Cataract extraction    . Mastectomy Right   . Colonoscopy    . Endobronchial ultrasound N/A 02/05/2015    Procedure: ENDOBRONCHIAL ULTRASOUND;  Surgeon: Flora Lipps, MD;  Location: [facility] ORS;  Service: Cardiopulmonary;  Laterality: N/A;  . Flexible bronchoscopy N/A 07/27/2015    Procedure: FLEXIBLE BRONCHOSCOPY;  Surgeon: Allyne Gee, MD;  Location: [facility] ORS;  Service: Pulmonary;  Laterality: N/A;   Prior to Admission medications   Medication Sig Start Date End Date Taking? Authorizing Provider  acetaminophen (TYLENOL) 325 MG tablet Take 650 mg by mouth every 6 (six) hours as needed for pain.   Yes Historical Provider, MD  calcium  citrate-vitamin D (CITRACAL+D) 315-200 MG-UNIT tablet Take 1 tablet by mouth 2 (two) times daily.   Yes Historical Provider, MD  glipiZIDE (GLUCOTROL XL) 5 MG 24 hr tablet Take by mouth. 09/20/14 09/20/15 Yes Historical Provider, MD  hydrochlorothiazide (HYDRODIURIL) 25 MG tablet Take 12.5 mg by mouth daily.    Yes Historical Provider, MD  letrozole (FEMARA) 2.5 MG tablet Take 2.5 mg by mouth daily.   Yes Historical Provider, MD  Multiple Vitamins-Minerals (MULTIVITAMIN PO) Take by mouth.    Yes Historical Provider, MD  naproxen sodium (ANAPROX) 220 MG tablet Take 220 mg by mouth every 8 (eight) hours as needed.   Yes Historical Provider, MD  ondansetron (ZOFRAN) 4 MG tablet Take 1 tablet (4 mg total) by mouth every 6 (six) hours as needed for nausea or vomiting. 03/06/15  Yes Forest Gleason, MD  potassium chloride SA (K-DUR,KLOR-CON) 20 MEQ tablet Take 1 tablet (20 mEq total) by mouth daily. 05/08/15  Yes Forest Gleason, MD  simvastatin (ZOCOR) 10 MG tablet Take 10 mg by mouth daily.   Yes Historical Provider, MD  esomeprazole (NEXIUM) 20 MG capsule Take 1 capsule (20 mg total) by mouth every morning. Patient not taking: Reported on 07/05/2015 03/27/15   Noreene Filbert, MD  gabapentin (NEURONTIN) 300 MG capsule Take 300 mg by mouth as needed.    Historical Provider, MD  nicotine polacrilex (NICORETTE) 2 MG gum Take 2 mg by mouth 5 (five) times daily.     Historical Provider, MD  PARoxetine Mesylate (BRISDELLE PO) Take 1 tablet by mouth at bedtime.    Historical Provider, MD  venlafaxine (EFFEXOR) 37.5 MG tablet Take 1 tablet (37.5 mg total) by mouth daily. Patient not taking: Reported on 07/27/2015 04/17/15   Noreene Filbert, MD   Allergies  Allergen Reactions  . Metformin Diarrhea and Nausea Only  . Other Rash    Sage Wipes     FAMILY HISTORY   Family History  Problem Relation Age of Onset  . Diabetes Other   . Hyperlipidemia Other   . Hypertension Other       SOCIAL HISTORY    reports that she quit smoking about 2 years ago. Her smoking use included Cigarettes. She has a 15 pack-year smoking history. She has never used smokeless tobacco. She reports that she does not drink alcohol or use illicit drugs.  Review of Systems  Constitutional: Negative for fever, chills and weight loss.  HENT: Negative for hearing loss.   Respiratory: Positive for cough, sputum production and shortness of breath.        Improving  Cardiovascular: Positive for palpitations. Negative for chest  pain, claudication and leg swelling.  Gastrointestinal: Negative for heartburn, nausea and vomiting.  Genitourinary: Negative for dysuria.  Neurological: Negative for headaches.  Endo/Heme/Allergies: Does not bruise/bleed easily.  Psychiatric/Behavioral: Negative for depression.      VITAL SIGNS    Temp:  [98.3 F (36.8 C)-98.8 F (37.1 C)] 98.4 F (36.9 C) (11/27 0805) Pulse Rate:  [67-133] 67 (11/27 0800) Resp:  [18-33] 20 (11/27 0800) BP: (102-137)/(58-87) 137/78 mmHg (11/27 0800) SpO2:  [75 %-96 %] 94 % (11/27 0800) FiO2 (%):  [96.4 %-97 %] 96.4 % (11/27 0343) HEMODYNAMICS:   VENTILATOR SETTINGS: Vent Mode:  [-]  FiO2 (%):  [96.4 %-97 %] 96.4 % INTAKE / OUTPUT:  Intake/Output Summary (Last 24 hours) at 07/28/15 0925 Last data filed at 07/28/15 0619  Gross per 24 hour  Intake   2250 ml  Output   1500  ml  Net    750 ml       PHYSICAL EXAM   Physical Exam  Constitutional: She is oriented to person, place, and time. She appears well-developed and well-nourished.  Moderate respiratory distress on BiPAP  HENT:  Head: Normocephalic and atraumatic.  Right Ear: External ear normal.  Left Ear: External ear normal.  Eyes: Conjunctivae and EOM are normal. Pupils are equal, round, and reactive to light.  Neck: Normal range of motion. Neck supple.  Cardiovascular: Normal heart sounds and intact distal pulses.   No murmur heard. Pulmonary/Chest: She is in respiratory distress. She has wheezes.  On HFNC, no use of SCM, mild diffuse wheeze. improving  Abdominal: Soft. Bowel sounds are normal. She exhibits no distension.  Musculoskeletal: Normal range of motion.  Neurological: She is alert and oriented to person, place, and time.  Skin: Skin is warm and dry.  Nursing note and vitals reviewed.      LABS   LABS:  CBC  Recent Labs Lab 07/13/2015 0609 07/27/15 0537 07/28/15 0541  WBC 19.6* 16.3* 21.3*  HGB 10.2* 10.3* 10.4*  HCT 31.8* 31.9* 31.8*  PLT 230  237 264   Coag's No results for input(s): APTT, INR in the last 168 hours. BMET  Recent Labs Lab 07/05/2015 0609 07/26/15 0445 07/27/15 0537 07/28/15 0541  NA 140  --  135 137  K 3.7  --  3.7 3.6  CL 107  --  100* 101  CO2 25  --  27 30  BUN 12  --  15 20  CREATININE 0.83 0.97 1.00 0.96  GLUCOSE 143*  --  203* 162*   Electrolytes  Recent Labs Lab 07/16/2015 0609 07/27/15 0537 07/28/15 0541  CALCIUM 9.3 9.5 9.5   Sepsis Markers No results for input(s): LATICACIDVEN, PROCALCITON, O2SATVEN in the last 168 hours. ABG No results for input(s): PHART, PCO2ART, PO2ART in the last 168 hours. Liver Enzymes No results for input(s): AST, ALT, ALKPHOS, BILITOT, ALBUMIN in the last 168 hours. Cardiac Enzymes No results for input(s): TROPONINI, PROBNP in the last 168 hours. Glucose  Recent Labs Lab 07/26/15 2201 07/27/15 0701 07/27/15 1110 07/27/15 1622 07/27/15 2130 07/28/15 0712  GLUCAP 217* 179* 198* 199* 166* 128*     Recent Results (from the past 240 hour(s))  Culture, sputum-assessment     Status: None   Collection Time: 07/20/15  7:33 AM  Result Value Ref Range Status   Specimen Description EXPECTORATED SPUTUM  Final   Special Requests NONE  Final   Sputum evaluation   Final    Sputum specimen not acceptable for testing.  Please recollect.   Results Called to: [facility] AT 1610 ON 07/20/15 CTJ    Report Status 07/20/2015 FINAL  Final  Fungus Culture with Smear     Status: None (Preliminary result)   Collection Time: 07/20/2015  9:58 AM  Result Value Ref Range Status   Specimen Description BRONCHIAL WASHINGS  Final   Special Requests NONE  Final   Culture HOLDING FOR POSSIBLE PATHOGEN  Final   Report Status PENDING  Incomplete  Culture, respiratory (NON-Expectorated)     Status: None   Collection Time: 07/07/2015  9:58 AM  Result Value Ref Range Status   Specimen Description BRONCHIAL WASHINGS  Final   Special Requests WASHING RIGHT  Final   Culture  Consistent with normal respiratory flora.  Final   Report Status 07/26/2015 FINAL  Final  MRSA PCR Screening     Status: Abnormal  Collection Time: 07/26/15  9:01 AM  Result Value Ref Range Status   MRSA by PCR POSITIVE (A) NEGATIVE Final    Comment:        The GeneXpert MRSA Assay (FDA approved for NASAL specimens only), is one component of a comprehensive MRSA colonization surveillance program. It is not intended to diagnose MRSA infection nor to guide or monitor treatment for MRSA infections. CRITICAL RESULT CALLED TO, READ BACK BY AND VERIFIED WITH: KENDRA SIMSER ON 07/26/15 AT 1036 BY QSD      Current facility-administered medications:  .  0.9 %  sodium chloride infusion, , Intravenous, Continuous, Evlyn Kanner, NP, Last Rate: 50 mL/hr at 07/27/15 2000 .  acetaminophen (TYLENOL) tablet 650 mg, 650 mg, Oral, Q4H PRN, Evlyn Kanner, NP, 650 mg at 07/22/15 1328 .  butamben-tetracaine-benzocaine (CETACAINE) spray 1 spray, 1 spray, Topical, Once, Allyne Gee, MD .  calcium citrate-vitamin D 500-400 MG-UNIT per chewable tablet 1 tablet, 1 tablet, Oral, BID, Evlyn Kanner, NP, 1 tablet at 07/27/15 2144 .  Chlorhexidine Gluconate Cloth 2 % PADS 6 each, 6 each, Topical, Q0600, Vilinda Boehringer, MD, 6 each at 07/26/15 1145 .  enoxaparin (LOVENOX) injection 100 mg, 1.5 mg/kg, Subcutaneous, Q24H, Cammie Sickle, MD, 100 mg at 07/27/15 1922 .  feeding supplement (ENSURE ENLIVE) (ENSURE ENLIVE) liquid 237 mL, 237 mL, Oral, BID BM, Semira Stoltzfus, MD, 237 mL at 07/27/15 1500 .  glipiZIDE (GLUCOTROL XL) 24 hr tablet 5 mg, 5 mg, Oral, Q breakfast, Evlyn Kanner, NP, 5 mg at 07/28/15 0804 .  guaiFENesin-dextromethorphan (ROBITUSSIN DM) 100-10 MG/5ML syrup 10 mL, 10 mL, Oral, Q4H PRN, Evlyn Kanner, NP, 10 mL at 07/27/15 0929 .  hydrochlorothiazide (HYDRODIURIL) tablet 12.5 mg, 12.5 mg, Oral, Daily, Evlyn Kanner, NP, 12.5 mg at 07/27/15 0930 .  insulin aspart (novoLOG)  injection 0-15 Units, 0-15 Units, Subcutaneous, TID WC, Cammie Sickle, MD, 2 Units at 07/28/15 0805 .  insulin aspart (novoLOG) injection 0-5 Units, 0-5 Units, Subcutaneous, QHS, Cammie Sickle, MD, 2 Units at 07/26/15 2203 .  letrozole [facility]) tablet 2.5 mg, 2.5 mg, Oral, Daily, Evlyn Kanner, NP, 2.5 mg at 07/27/15 1025 .  levalbuterol (XOPENEX) nebulizer solution 1.25 mg, 1.25 mg, Nebulization, Q4H PRN, Jadien Lehigh, MD .  lidocaine (XYLOCAINE) 2 % jelly 1 application, 1 application, Topical, Once, Allyne Gee, MD .  methylPREDNISolone sodium succinate (SOLU-MEDROL) 125 mg/2 mL injection 60 mg, 60 mg, Intravenous, 3 times per day, 60 mg at 07/28/15 0548 **FOLLOWED BY** [START ON 07/30/2015] predniSONE (DELTASONE) tablet 40 mg, 40 mg, Oral, Q breakfast, Mellina Benison, MD .  morphine 2 MG/ML injection 1 mg, 1 mg, Intravenous, Q6H PRN, Vilinda Boehringer, MD, 1 mg at 07/28/15 0812 .  mupirocin ointment (BACTROBAN) 2 % 1 application, 1 application, Nasal, BID, Vilinda Boehringer, MD, 1 application at 69/67/89 2152 .  nystatin (MYCOSTATIN) 100000 UNIT/ML suspension 500,000 Units, 5 mL, Mouth/Throat, QID, Lequita Asal, MD, 500,000 Units at 07/27/15 2145 .  ondansetron (ZOFRAN) injection 4 mg, 4 mg, Intravenous, Q6H PRN, Cammie Sickle, MD, 4 mg at 07/20/15 0121 .  pantoprazole (PROTONIX) EC tablet 40 mg, 40 mg, Oral, Daily, Evlyn Kanner, NP, 40 mg at 07/27/15 0931 .  PARoxetine (PAXIL) tablet 10 mg, 10 mg, Oral, QHS, Evlyn Kanner, NP, 10 mg at 07/27/15 2143 .  phenylephrine (NEO-SYNEPHRINE) 0.25 % nasal spray 1 spray, 1 spray, Each Nare, Q6H PRN, Allyne Gee, MD .  piperacillin-tazobactam (ZOSYN) IVPB 3.375 g, 3.375 g, Intravenous, 3 times per day, Vilinda Boehringer, MD, 3.375 g at 07/28/15 0240 .  potassium chloride SA (K-DUR,KLOR-CON) CR tablet 20 mEq, 20 mEq, Oral, Daily, Evlyn Kanner, NP, 20 mEq at 07/27/15 0930 .  senna-docusate (Senokot-S) tablet 1 tablet, 1 tablet,  Oral, QHS PRN, Evlyn Kanner, NP .  simvastatin (ZOCOR) tablet 10 mg, 10 mg, Oral, Daily, Evlyn Kanner, NP, 10 mg at 07/27/15 2143 .  sodium chloride 0.9 % injection 10 mL, 10 mL, Intravenous, PRN, Cammie Sickle, MD, 10 mL at 07/22/15 2010 .  vancomycin (VANCOCIN) IVPB 1000 mg/200 mL premix, 1,000 mg, Intravenous, Q12H, Sheema M Hallaji, RPH, 1,000 mg at 07/28/15 0056  Facility-Administered Medications Ordered in Other Encounters:  .  sodium chloride 0.9 % injection 10 mL, 10 mL, Intracatheter, PRN, Forest Gleason, MD, 10 mL at 02/04/15 1027  IMAGING    No results found.    Indwelling Urinary Catheter continued, requirement due to   Reason to continue Indwelling Urinary Catheter for strict Intake/Output monitoring for hemodynamic instability   Central Line continued, requirement due to   Reason to continue [employer] Monitoring of central venous pressure or other hemodynamic parameters   Ventilator continued, requirement due to, resp failure    Ventilator Sedation RASS 0 to -2   Cultures: BCx2  UC  Sputum 11/19 >>speciment not acceptable for testing BAL 11/23>>normal flora MRSA (+) 11/25  Antibiotics: Vanc 11/18>>11/29 Zosyn 11/18>>11/29 Azithro 11/22>>11/28 Lines:   ASSESSMENT/PLAN  54 year old female with bilateral pneumonia, recurrent left upper lobe lung cancer, shortness of breath, admitted to the ICU for respiratory distress and hypoxia  PULMONARY Respiratory failure-hypoxia Recurrent left upper lobe lung cancer Radiation pneumonitis Bilateral pneumonia  P:   - tolerating HFNC this morning, may use intermitted BIpap  -Maintain O2 saturations greater than 88% -Vancomycin/zosyn for 10 days total, a Zithromax and for 5 days total -stop scheduled nebs due to rebound anxiety and bronchospams, xopenox PRN -Change oral steroids to IV, 60 every 63 days then transition back to prednisone 40 mg daily until outpatient follow-up with  pulmonologist -Chest x-ray with left upper lobe consolidation, reviewing the images also fits with radiation pneumonitis. -Status post bronchoscopy with BAL on 11/23 - cultures negative to date -10 days total of antibiotics -Patient does have a mild anxiety component with her respiratory distress, 1 mg morphine IV every 6 hours for shortness of breath and chest pain 3 days -Lung cancer/stage III status post chemoradiation, finished 04/10/2015; clinically patient fits time window for radiation pneumonitis and will treat as such  CARDIOVASCULAR -Continue to monitor hemodynamic status   RENAL -Monitor electrolytes -ICU to try protocol replacement  GASTROINTESTINAL SUP - PPI  HEMATOLOGIC History of breast cancer Stage III left upper lobe lung cancer -Status post chemoradiation 04/10/2015 -Hematology/oncology following -Continue with monitoring of CBC   INFECTIOUS Bilateral pneumonia -Continue with current antibiotics, for a total of 10 days -BAL with normal flora   ENDOCRINE -ssi  NEUROLOGIC RASS goal: 0   I have personally obtained a history, examined the patient, evaluated laboratory and imaging results, formulated the assessment and plan and placed orders.  The Patient requires high complexity decision making for assessment and support, frequent evaluation and titration of therapies, application of advanced monitoring technologies and extensive interpretation of multiple databases.   Critical Care Time devoted to patient care services described in this note is 40 minutes.   Overall, patient is critically ill, prognosis is guarded. Patient at  high risk for cardiac arrest and death.   Vilinda Boehringer, MD [facility] Pager [phone] (please enter 7-digits) On Call Pager [phone] (please enter 7-digits)     07/28/2015, 9:25 AM  Note: This note was prepared with Dragon dictation along with smaller phrase technology. Any  transcriptional errors that result from this process are unintentional.

## 2015-07-28 NOTE — Progress Notes (Signed)
ANTIBIOTIC CONSULT NOTE - Follow up  Pharmacy Consult for Zosyn/Vancomycin Dosing Indication: rule out pneumonia  Allergies  Allergen Reactions  . Metformin Diarrhea and Nausea Only  . Other Rash    Sage Wipes   Patient Measurements: Height: '5\' 2"'$  (157.5 cm) Weight: 144 lb (65.318 kg) IBW/kg (Calculated) : 50.1 Adjusted Body Weight: 56 kg  Vital Signs: Temp: 98.4 F (36.9 C) (11/27 0805) Temp Source: Oral (11/27 0805) BP: 137/78 mmHg (11/27 0800) Pulse Rate: 67 (11/27 0800) Intake/Output from previous day: 11/26 0701 - 11/27 0700 In: 2420 [P.O.:720; I.V.:1150; IV Piggyback:550] Out: 1500 [Urine:1500] Intake/Output from this shift:    Labs:  Recent Labs  07/26/15 0445 07/27/15 0537 07/28/15 0541  WBC  --  16.3* 21.3*  HGB  --  10.3* 10.4*  PLT  --  237 264  CREATININE 0.97 1.00 0.96   Estimated Creatinine Clearance: 58.7 mL/min (by C-G formula based on Cr of 0.96).  Recent Labs  07/26/15 1208  [place] 28*     Microbiology: Recent Results (from the past 720 hour(s))  Culture, sputum-assessment     Status: None   Collection Time: 07/20/15  7:33 AM  Result Value Ref Range Status   Specimen Description EXPECTORATED SPUTUM  Final   Special Requests NONE  Final   Sputum evaluation   Final    Sputum specimen not acceptable for testing.  Please recollect.   Results Called to: [facility] PARDINI AT 5400 ON 07/20/15 CTJ    Report Status 07/20/2015 FINAL  Final  Fungus Culture with Smear     Status: None (Preliminary result)   Collection Time: 07/23/2015  9:58 AM  Result Value Ref Range Status   Specimen Description BRONCHIAL WASHINGS  Final   Special Requests NONE  Final   Culture HOLDING FOR POSSIBLE PATHOGEN  Final   Report Status PENDING  Incomplete  Culture, respiratory (NON-Expectorated)     Status: None   Collection Time: 07/18/2015  9:58 AM  Result Value Ref Range Status   Specimen Description BRONCHIAL WASHINGS  Final   Special Requests WASHING RIGHT   Final   Culture Consistent with normal respiratory flora.  Final   Report Status 07/26/2015 FINAL  Final  MRSA PCR Screening     Status: Abnormal   Collection Time: 07/26/15  9:01 AM  Result Value Ref Range Status   MRSA by PCR POSITIVE (A) NEGATIVE Final    Comment:        The GeneXpert MRSA Assay (FDA approved for NASAL specimens only), is one component of a comprehensive MRSA colonization surveillance program. It is not intended to diagnose MRSA infection nor to guide or monitor treatment for MRSA infections. CRITICAL RESULT CALLED TO, READ BACK BY AND VERIFIED WITH: KENDRA SIMSER ON 07/26/15 AT 1036 BY QSD     Assessment: Pharmacy consulted to dose vancomycin and Zosyn for 54 yo female being treated for left upper lobe PNA. ID consulted, azithromycin added 11/22, course completed today.   Trough 11/25 at 1208 supratherapeutic at 28. Patients Scr has gradually increased from 0.75 on 9/23  to 1.0 today.   Today is day 9 of Abx treatment.    Goal of Therapy:  Vancomycin trough level 15-20 mcg/ml  Plan:  Continue zosyn 3.375gm IV Q8H extended infusion. Stop date 07/29/15.   Will continue Vancomycin regimen to 1g every 12 hours. Stop date 0000 on 07/29/15 per Dr. Stevenson Clinch and ID treatment plan of 10 days.   Recommend not checking trough unless significant increase in  WBC, 50% increase in Scr or clinical worsening. Scr today slight increase from 0.97 to 1.0.     Pharmacy will continue to monitor renal function and labs and make adjustments as needed.    Olivia Canter [facility] Clinical Pharmacist  07/28/2015 10:35 AM

## 2015-07-29 ENCOUNTER — Inpatient Hospital Stay: Payer: No Typology Code available for payment source | Admitting: Oncology

## 2015-07-29 DIAGNOSIS — J700 Acute pulmonary manifestations due to radiation: Secondary | ICD-10-CM | POA: Diagnosis not present

## 2015-07-29 DIAGNOSIS — C3431 Malignant neoplasm of lower lobe, right bronchus or lung: Secondary | ICD-10-CM | POA: Diagnosis not present

## 2015-07-29 DIAGNOSIS — C3412 Malignant neoplasm of upper lobe, left bronchus or lung: Secondary | ICD-10-CM | POA: Diagnosis not present

## 2015-07-29 DIAGNOSIS — J9620 Acute and chronic respiratory failure, unspecified whether with hypoxia or hypercapnia: Secondary | ICD-10-CM | POA: Diagnosis not present

## 2015-07-29 DIAGNOSIS — C3491 Malignant neoplasm of unspecified part of right bronchus or lung: Secondary | ICD-10-CM | POA: Diagnosis not present

## 2015-07-29 DIAGNOSIS — J189 Pneumonia, unspecified organism: Secondary | ICD-10-CM | POA: Diagnosis not present

## 2015-07-29 DIAGNOSIS — J701 Chronic and other pulmonary manifestations due to radiation: Secondary | ICD-10-CM | POA: Diagnosis not present

## 2015-07-29 DIAGNOSIS — J9601 Acute respiratory failure with hypoxia: Secondary | ICD-10-CM | POA: Diagnosis not present

## 2015-07-29 DIAGNOSIS — C50512 Malignant neoplasm of lower-outer quadrant of left female breast: Secondary | ICD-10-CM | POA: Diagnosis not present

## 2015-07-29 DIAGNOSIS — J9621 Acute and chronic respiratory failure with hypoxia: Secondary | ICD-10-CM | POA: Diagnosis not present

## 2015-07-29 DIAGNOSIS — C3492 Malignant neoplasm of unspecified part of left bronchus or lung: Secondary | ICD-10-CM

## 2015-07-29 DIAGNOSIS — J9600 Acute respiratory failure, unspecified whether with hypoxia or hypercapnia: Secondary | ICD-10-CM | POA: Diagnosis not present

## 2015-07-29 DIAGNOSIS — J15212 Pneumonia due to Methicillin resistant Staphylococcus aureus: Secondary | ICD-10-CM | POA: Diagnosis not present

## 2015-07-29 DIAGNOSIS — C3490 Malignant neoplasm of unspecified part of unspecified bronchus or lung: Secondary | ICD-10-CM | POA: Diagnosis not present

## 2015-07-29 LAB — CBC WITH DIFFERENTIAL/PLATELET
Basophils Absolute: 0.1 10*3/uL (ref 0–0.1)
Basophils Relative: 0 %
Eosinophils Absolute: 0 10*3/uL (ref 0–0.7)
Eosinophils Relative: 0 %
HCT: 33.2 % — ABNORMAL LOW (ref 35.0–47.0)
Hemoglobin: 11.1 g/dL — ABNORMAL LOW (ref 12.0–16.0)
Lymphocytes Relative: 2 %
Lymphs Abs: 0.4 10*3/uL — ABNORMAL LOW (ref 1.0–3.6)
MCH: 30.9 pg (ref 26.0–34.0)
MCHC: 33.4 g/dL (ref 32.0–36.0)
MCV: 92.3 fL (ref 80.0–100.0)
Monocytes Absolute: 0.8 10*3/uL (ref 0.2–0.9)
Monocytes Relative: 4 %
Neutro Abs: 18.7 10*3/uL — ABNORMAL HIGH (ref 1.4–6.5)
Neutrophils Relative %: 94 %
Platelets: 271 10*3/uL (ref 150–440)
RBC: 3.59 MIL/uL — ABNORMAL LOW (ref 3.80–5.20)
RDW: 15.3 % — ABNORMAL HIGH (ref 11.5–14.5)
WBC: 19.9 10*3/uL — ABNORMAL HIGH (ref 3.6–11.0)

## 2015-07-29 LAB — GLUCOSE, CAPILLARY
GLUCOSE-CAPILLARY: 136 mg/dL — AB (ref 65–99)
GLUCOSE-CAPILLARY: 211 mg/dL — AB (ref 65–99)
Glucose-Capillary: 76 mg/dL (ref 65–99)
Glucose-Capillary: 153 mg/dL — ABNORMAL HIGH (ref 65–99)

## 2015-07-29 LAB — BASIC METABOLIC PANEL
Anion gap: 6 (ref 5–15)
BUN: 26 mg/dL — ABNORMAL HIGH (ref 6–20)
CO2: 29 mmol/L (ref 22–32)
Calcium: 9.3 mg/dL (ref 8.9–10.3)
Chloride: 101 mmol/L (ref 101–111)
Creatinine, Ser: 0.96 mg/dL (ref 0.44–1.00)
GFR calc Af Amer: >60 mL/min (ref >60)
GFR calc non Af Amer: >60 mL/min (ref >60)
Glucose, Bld: 179 mg/dL — ABNORMAL HIGH (ref 65–99)
Potassium: 3.5 mmol/L (ref 3.5–5.1)
Sodium: 136 mmol/L (ref 135–145)

## 2015-07-29 LAB — VANCOMYCIN, TROUGH: Vancomycin Tr: 28 ug/mL (ref 10–20)

## 2015-07-29 MED ORDER — CALCIUM CITRATE-VITAMIN D 500-400 MG-UNIT PO CHEW: 1.0000 | CHEWABLE_TABLET | Freq: Every day | ORAL | Status: DC

## 2015-07-29 MED ORDER — CALCIUM CITRATE-VITAMIN D 315-250 MG-UNIT PO TABS
1.0000 | ORAL_TABLET | Freq: Two times a day (BID) | ORAL | Status: DC
  Administered 2015-07-29 – 2015-08-03 (×11): 1 via ORAL
  Filled 2015-07-29 (×18): qty 1

## 2015-07-29 MED ORDER — FAMOTIDINE 20 MG PO TABS
20.0000 mg | ORAL_TABLET | Freq: Two times a day (BID) | ORAL | Status: DC
  Administered 2015-07-30 – 2015-08-04 (×11): 20 mg via ORAL
  Filled 2015-07-29 (×11): qty 1

## 2015-07-29 NOTE — Progress Notes (Signed)
[facility] [place] NAME: Brittney Lam    MR#:  [id number]  DATE OF BIRTH:  [birth year]  SUBJECTIVE:  Came in with increasing sob and low sats Feeling better today. On high flow nasal cannula 80% Sats 91-93%  REVIEW OF SYSTEMS:   Review of Systems  Constitutional: Negative for fever, chills and weight loss.  HENT: Negative for ear discharge, ear pain and nosebleeds.   Eyes: Negative for blurred vision, pain and discharge.  Respiratory: Positive for cough, sputum production and shortness of breath. Negative for wheezing and stridor.   Cardiovascular: Negative for chest pain, palpitations, orthopnea and PND.  Gastrointestinal: Negative for nausea, vomiting, abdominal pain and diarrhea.  Genitourinary: Negative for urgency and frequency.  Musculoskeletal: Negative for back pain and joint pain.  Neurological: Positive for weakness. Negative for sensory change, speech change and focal weakness.  Psychiatric/Behavioral: Negative for depression and hallucinations. The patient is not nervous/anxious.   All other systems reviewed and are negative.  Tolerating Diet:yes Tolerating PT: not seen yet since on HFNC  DRUG ALLERGIES:   Allergies  Allergen Reactions  . Metformin Diarrhea and Nausea Only  . Other Rash    Sage Wipes    VITALS:  Blood pressure 132/70, pulse 101, temperature 98.6 F (37 C), temperature source Oral, resp. rate 27, height '5\' 2"'$  (1.575 m), weight 144 lb (65.318 kg), SpO2 92 %.  PHYSICAL EXAMINATION:   Physical Exam  GENERAL:  54 y.o.-year-old patient lying in the bed with mild  acute distress.  EYES: Pupils equal, round, reactive to light and accommodation. No scleral icterus. Extraocular muscles intact.  HEENT: Head atraumatic, normocephalic. Oropharynx and nasopharynx clear.  NECK:  Supple, no jugular venous distention. No thyroid enlargement, no tenderness.  LUNGS: distsnt breath sounds bilaterally,  rales, rhonchi. No use of accessory muscles of respiration. Scattered wheezing CARDIOVASCULAR: S1, S2 normal. No murmurs, rubs, or gallops. Mild tachycardia ABDOMEN: Soft, nontender, nondistended. Bowel sounds present. No organomegaly or mass.  EXTREMITIES: No cyanosis, clubbing or edema b/l.    NEUROLOGIC: Cranial nerves II through XII are intact. No focal Motor or sensory deficits b/l.   PSYCHIATRIC: The patient is alert and oriented x 3.  SKIN: No obvious rash, lesion, or ulcer.    LABORATORY PANEL:   CBC  Recent Labs Lab 07/29/15 0533  WBC 19.9*  HGB 11.1*  HCT 33.2*  PLT 271    Chemistries   Recent Labs Lab 07/29/15 0533  NA 136  K 3.5  CL 101  CO2 29  GLUCOSE 179*  BUN 26*  CREATININE 0.96  CALCIUM 9.3    Cardiac Enzymes No results for input(s): TROPONINI in the last 168 hours.  RADIOLOGY:  No results found.   ASSESSMENT AND PLAN:  Brittney Lam is a 54 y.o. 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  * Acute respiratory failure with hypoxia Left upper lobe lung cancer, radiation pneumonitis and bilateral pneumonia BAL is negative 07/25/2015)  On broad-spectrum antibiotic coverage with IV vanc, zithromax  and zosyn completed abxs Scheduled Neb and IV steroid (high doses for possible radiation pneumonitis) -HFNC and Bipap prn Pt is full code(readdressed)  * Diabetes  On glipizide and Insulin sliding coverage.  * Sinus tachycardia  Likely  due to Hypoxia.  * Lung cancer Last chemo and radiation several weeks ago  * Hyperlipidemia  Zocor.  Case discussed with Care Management/Social Worker. Management plans discussed with the patient, family and they are in agreement.  CODE STATUS:  full  DVT Prophylaxis: lovenox  TOTAL  TIME TAKING CARE OF THIS PATIENT: 30 minutes.  >50% time spent on counselling and coordination of care  POSSIBLE D/C IN fewDAYS, DEPENDING ON CLINICAL CONDITION.   Shailen Thielen M.D on 07/29/2015 at 2:05 PM  Between 7am to 6pm - Pager - [phone]  After 6pm go to [email or web address] - password [email or web address]  [place] [facility]  Office  [phone]  CC: Primary care physician; [name], MD

## 2015-07-29 NOTE — Progress Notes (Signed)
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   P:  - tolerating HFNC this morning, the patient has been having trouble tolerating BiPAP. Therefore, we'll continue on high flow nasal cannula.  -Maintain O2 saturations greater than 88% -Vancomycin/zosyn for 10 days total, a Zithromax and for 5 days total - stopped scheduled nebs due to rebound anxiety and bronchospams, xopenox PRN -oral steroids IV, 60 every 63 days then transition back to prednisone 40 mg daily until outpatient follow-up with pulmonologist -Chest x-ray with left upper lobe consolidation, reviewing the images also fits with radiation pneumonitis. -Status post bronchoscopy with BAL on 11/23 - cultures negative to date -10 days total of antibiotics -Patient does have a mild anxiety component with her respiratory distress, 1 mg morphine IV every 6 hours for shortness of breath and chest pain 3 days -Lung cancer/stage III status post chemoradiation, finished 04/10/2015; clinically patient fits time window for radiation pneumonitis and will treat as such  CARDIOVASCULAR -Continue to monitor hemodynamic status   RENAL -Monitor electrolytes -ICU to try protocol replacement  GASTROINTESTINAL SUP - PPI  HEMATOLOGIC History of breast cancer Stage III left upper lobe lung cancer -Status post chemoradiation 04/10/2015. The patient also received chest wall radiation on the left side for breast cancer in 2015.  -Hematology/oncology following -Continue with monitoring of CBC   INFECTIOUS Bilateral pneumonia -Continue with current antibiotics, for a total of 10 days -BAL with normal  flora   ENDOCRINE -ssi  NEUROLOGIC RASS goal: 0 ---------------------------------------   ----------------------------------------   Name: Brittney Lam MRN: [id number] DOB: 1961/04/25    ADMISSION DATE:  07/06/2015    CHIEF COMPLAINT. Dyspnea   Subjective Patient has no particular complaints today, she is awake, alert and conversational. She does not appear to be particularly dyspneic, even though she is requiring a high amount of oxygen.  Review of Systems:  The patient denies chest pain, PND, cough, expectoration. The remainder of the review systems was reviewed with this patient and found to be negative.    VITAL SIGNS: Temp:  [98.3 F (36.8 C)-98.7 F (37.1 C)] 98.6 F (37 C) (11/28 1200) Pulse Rate:  [56-112] 101 (11/28 1000) Resp:  [15-32] 27 (11/28 1000) BP: (114-156)/(65-92) 132/70 mmHg (11/28 1000) SpO2:  [83 %-95 %] 92 % (11/28 1100) FiO2 (%):  [80 %-90 %] 90 % (11/28 1100) HEMODYNAMICS:   VENTILATOR SETTINGS: Vent Mode:  [-]  FiO2 (%):  [80 %-90 %] 90 % INTAKE / OUTPUT:  Intake/Output Summary (Last 24 hours) at 07/29/15 1233 Last data filed at 07/29/15 1200  Gross per 24 hour  Intake   2770 ml  Output   1427 ml  Net   1343 ml    PHYSICAL EXAMINATION: Physical Examination:   VS: BP 132/70 mmHg  Pulse 101  Temp(Src) 98.6 F (37 C) (Oral)  Resp 27  Ht '5\' 2"'$  (1.575 m)  Wt 65.318 kg (144 lb)  BMI 26.33 kg/m2  SpO2 92%  General Appearance: No distress  Neuro:without focal findings, mental status normal. HEENT: PERRLA, EOM intact. Pulmonadecreased breath sounds bilaterally.rdiovascularNormal S1,S2.  No m/r/g.   Abdomen: Benign, Soft, non-tender. Renal:  No costovertebral tenderness  GU:  Not performed at this time. Endocrine: No evident thyromegaly. Skin:  warm, no rashes, no ecchymosis  Extremities: normal, no cyanosis, clubbing.   LABS:   LABORATORY PANEL:   CBC  Recent Labs Lab 07/29/15 0533  WBC 19.9*  HGB 11.1*   HCT 33.2*  PLT 271    Chemistries   Recent Labs Lab 07/29/15 0533  NA 136  K 3.5  CL 101  CO2 29  GLUCOSE 179*  BUN 26*  CREATININE 0.96  CALCIUM 9.3     Recent Labs Lab 07/27/15 2130 07/28/15 0712 07/28/15 1058 07/28/15 1610 07/28/15 2024 07/29/15 0705  GLUCAP 166* 128* 205* 79 230* 136*   No results for input(s): PHART, PCO2ART, PO2ART in the last 168 hours. No results for input(s): AST, ALT, ALKPHOS, BILITOT, ALBUMIN in the last 168 hours.  Cardiac Enzymes No results for input(s): TROPONINI in the last 168 hours.  RADIOLOGY:  No results found.     --Marda Stalker, MD.  [facility]  Patricia Pesa, M.D.  Vilinda Boehringer, M.D.  Merton Border, M.D  [place].  I have personally obtained a history, examined the patient, evaluated laboratory and imaging results, formulated the assessment and plan and placed orders. The case was discussed with the critical care RN, nurse manager, nutrition, ICU pharmacist. The Patient requires high complexity decision making for assessment and support, frequent evaluation and titration of therapies, application of advanced monitoring technologies and extensive interpretation of multiple databases. The patient has critical illness that could lead imminently to failure of 1 or more organ systems and requires the highest level of physician preparedness to intervene.  Critical Care Time devoted to patient care services described in this note is 35 minutes and is exclusive of time spent in procedures.

## 2015-07-29 NOTE — Progress Notes (Signed)
[place] @ [facility] [phone]  Fax:([phone]) [place]: 1960-09-04  MR#: [id number]  PZW#:[id number]  Patient Care Team: [name], MD as PCP - General (Family Medicine) [name], MD (General Surgery) [name], RN as [place] Management [name], MD as Referring Physician (Family Medicine)  CHIEF COMPLAINT:  No chief complaint on file.  Oncology History   50. 54 year old female status post recent left breast cancer in 2005  2. Right breast cancer, locally advanced stage IIIa (pyT3, N1, M0), invasive mammary carcinoma with mucin production. Status post right modified radical mastectomy for 8 cm lesion with one of 4 sentinel lymph nodes positive for metastatic disease. Tumor is ER positive PR negative HER-2/neu not overexpressed. 3. Completed neoadjuvant chemotherapy with chest wall radiation therapy,  Finished on October 21, 2013. 4. Started letrazole 2.5 mg by mouth daily from October 24, 2013. 5. Carcinoma of lung.  Right upper lobe, status post right upper lobectomy, [id number]. 6.  Left upper lobe mass with mediastinal lymph node biopsies positive for poorly differentiated adenocarcinoma T1 N1 M0 tumor III Starting chemoradiation therapy in July of 2016 (second primary) Patient had EUS in June of 2016.  Bilateral hilar adenopathy which has been biopsied was positive for metastatic adenocarcinoma consistent with lung primary Starting chemoradiation therapy in July of 2016 7.  Has finished 6 cycles of chemotherapy with carboplatinum and Taxol concurrent with radiation therapy on April 10, 2015       INTERVAL HISTORY:  54 year old African-American lady came today if as an acute heart on.  Has increasing shortness of breath started few days ago.  Also has developed left upper extremity swelling.\Patient is not using any oxygen at present time.  Oxygen saturation has dropped to 81% at rest.  Feeling  weak.  Tired. Declining performance status.  As per HPI. Otherwise, a complete review of systems is negatve. Patient was admitted in the hospital with progressive shortness of breath. Left upper extremity swelling with deep vein thrombosis in upper extremity. Patient has been evaluated by number of physician presently on IV antibiotics IV steroid on high flow oxygen.  Had bronchoscopy done which was reported to be negative.  Possibility of endoscopy, ultrasound bronchoscopy has been planned.  Patient remains very apprehensive.  Left upper extremity swelling is improved. Review of systems  general status: Patient is feeling weak and tired.  No change in a performance status.  No chills.  No fever. HEENT: Difficulty swallowing. Lungs: Increasing shortness of breath.  Cough.  No hemoptysis.  Oxygen saturation dropped to 81%. Cardiac: No chest pain or paroxysmal nocturnal dyspnea GI: No nausea no vomiting no diarrhea no abdominal pain Skin: No rash Lower extremity no swelling Left upper extremity is swelling.  Has improved Neurological system: No tingling.  No numbness.  No other focal signs Musculoskeletal system no bony pains    PAST MEDICAL HISTORY: Past Medical History  Diagnosis Date  . Unspecified essential hypertension   . Kidney failure   . Personal history of tobacco use, presenting hazards to health   . Cataract   . Personal history of malignant neoplasm of breast   . Breast screening, unspecified   . Special screening for malignant neoplasms, colon   . Diabetes mellitus without complication ([place])   . MDRO (multiple drug resistant organisms) resistance   . H. pylori duodenitis   . Complication of anesthesia     BP DROPPED DURING LOBECTOMY IN 2014  .  Cancer [facility]) 2001    left breast cancer s/p L/SN/R in 2001  . Cancer [facility]) 2014    right breast invasive CA, Er pos,Pr neg, Her 2 neg.  . Lung cancer, upper lobe ([place]) 2014    right upper lobe  . Breast cancer ([place])      PAST SURGICAL HISTORY: Past Surgical History  Procedure Laterality Date  . Intercostal nerve block  2005  . Cataract extraction w/ intraocular lens implant  2005  . Carpal tunnel release Right 2011  . Insertion central venous access device w/ subcutaneous port  12-07-12  . Breast lumpectomy Left 2001  . Breast surgery Right 2014    total mastectomy  . Right lung upper lobectomy  03/2013  . Lobectomy Right   . Cataract extraction    . Mastectomy Right   . Colonoscopy    . Endobronchial ultrasound N/A 02/05/2015    Procedure: ENDOBRONCHIAL ULTRASOUND;  Surgeon: Flora Lipps, MD;  Location: [facility] ORS;  Service: Cardiopulmonary;  Laterality: N/A;  . Flexible bronchoscopy N/A 07/16/2015    Procedure: FLEXIBLE BRONCHOSCOPY;  Surgeon: Allyne Gee, MD;  Location: [facility] ORS;  Service: Pulmonary;  Laterality: N/A;    FAMILY HISTORY Family History  Problem Relation Age of Onset  . Diabetes Other   . Hyperlipidemia Other   . Hypertension Other         ADVANCED DIRECTIVES: Patient does not have any advanced healthcare directive. Information has been given.   HEALTH MAINTENANCE: Social History  Substance Use Topics  . Smoking status: Former Smoker -- 0.50 packs/day for 30 years    Types: Cigarettes    Quit date: 02/03/2013  . Smokeless tobacco: Never Used     Comment: using 24m nicorette gum- 1/2 piece, 10 times per day  . Alcohol Use: No      Allergies  Allergen Reactions  . Metformin Diarrhea and Nausea Only  . Other Rash    Sage Wipes    Current Facility-Administered Medications  Medication Dose Route Frequency Provider Last Rate Last Dose  . acetaminophen (TYLENOL) tablet 650 mg  650 mg Oral Q4H PRN LEvlyn Kanner NP   650 mg at 07/22/15 1328  . butamben-tetracaine-benzocaine (CETACAINE) spray 1 spray  1 spray Topical Once SAllyne Gee MD      . Calcium Citrate-Vitamin D 315-250 MG-UNIT TABS 1 tablet  1 tablet Oral BID SFritzi Mandes MD   1 tablet at 07/29/15 1208   . enoxaparin (LOVENOX) injection 100 mg  1.5 mg/kg Subcutaneous Q24H GCammie Sickle MD   100 mg at 07/28/15 2037  . [START ON 07/30/2015] famotidine (PEPCID) tablet 20 mg  20 mg Oral BID PLaverle Hobby MD      . feeding supplement (ENSURE ENLIVE) (ENSURE ENLIVE) liquid 237 mL  237 mL Oral BID BM Vishal Mungal, MD   237 mL at 07/29/15 1000  . glipiZIDE (GLUCOTROL XL) 24 hr tablet 5 mg  5 mg Oral Q breakfast LEvlyn Kanner NP   5 mg at 07/29/15 0859  . guaiFENesin-dextromethorphan (ROBITUSSIN DM) 100-10 MG/5ML syrup 10 mL  10 mL Oral Q4H PRN LEvlyn Kanner NP   10 mL at 07/27/15 0929  . hydrochlorothiazide (HYDRODIURIL) tablet 12.5 mg  12.5 mg Oral Daily LEvlyn Kanner NP   12.5 mg at 07/29/15 0907  . insulin aspart (novoLOG) injection 0-15 Units  0-15 Units Subcutaneous TID WC GCammie Sickle MD   5 Units at 07/29/15 1208  . insulin aspart (novoLOG)  injection 0-5 Units  0-5 Units Subcutaneous QHS Cammie Sickle, MD   2 Units at 07/28/15 2208  . letrozole [facility]) tablet 2.5 mg  2.5 mg Oral Daily Evlyn Kanner, NP   2.5 mg at 07/29/15 1059  . levalbuterol (XOPENEX) nebulizer solution 1.25 mg  1.25 mg Nebulization Q4H PRN Vishal Mungal, MD      . lidocaine (XYLOCAINE) 2 % jelly 1 application  1 application Topical Once Allyne Gee, MD      . Derrill Memo ON 07/30/2015] predniSONE (DELTASONE) tablet 40 mg  40 mg Oral Q breakfast Vishal Mungal, MD       Followed by  . methylPREDNISolone sodium succinate (SOLU-MEDROL) 125 mg/2 mL injection 60 mg  60 mg Intravenous 3 times per day Vilinda Boehringer, MD   60 mg at 07/29/15 0521  . mupirocin ointment (BACTROBAN) 2 % 1 application  1 application Nasal BID Vilinda Boehringer, MD   1 application at 16/10/96 0908  . nystatin (MYCOSTATIN) 100000 UNIT/ML suspension 500,000 Units  5 mL Mouth/Throat QID Lequita Asal, MD   500,000 Units at 07/29/15 0907  . ondansetron (ZOFRAN) injection 4 mg  4 mg Intravenous Q6H PRN Cammie Sickle, MD   4 mg at 07/20/15 0121  . PARoxetine (PAXIL) tablet 10 mg  10 mg Oral QHS Evlyn Kanner, NP   10 mg at 07/28/15 2202  . phenylephrine (NEO-SYNEPHRINE) 0.25 % nasal spray 1 spray  1 spray Each Nare Q6H PRN Allyne Gee, MD      . potassium chloride SA (K-DUR,KLOR-CON) CR tablet 20 mEq  20 mEq Oral Daily Evlyn Kanner, NP   20 mEq at 07/29/15 0907  . senna-docusate (Senokot-S) tablet 1 tablet  1 tablet Oral QHS PRN Evlyn Kanner, NP      . simvastatin (ZOCOR) tablet 10 mg  10 mg Oral Daily Evlyn Kanner, NP   10 mg at 07/28/15 2037  . sodium chloride 0.9 % injection 10 mL  10 mL Intravenous PRN Cammie Sickle, MD   10 mL at 07/22/15 2010   Facility-Administered Medications Ordered in Other Encounters  Medication Dose Route Frequency Provider Last Rate Last Dose  . sodium chloride 0.9 % injection 10 mL  10 mL Intracatheter PRN Forest Gleason, MD   10 mL at 02/04/15 1027    OBJECTIVE:  Filed Vitals:   07/29/15 1000 07/29/15 1200  BP: 132/70   Pulse: 101   Temp:  98.6 F (37 C)  Resp: 27      Body mass index is 26.33 kg/(m^2).    ECOG FS:0 - Asymptomatic  PHYSICAL EXAM: GENERAL:  Performance status is declining Patient is in mild distress MENTAL STATUS:  Alert and oriented to person, place and time. HEAD:   Normocephalic, atraumatic, face symmetric, no Cushingoid features. EYES:    Pupils equal round and reactive to light and accomodation.  No conjunctivitis or scleral icterus. ENT:  Oropharynx clear without lesion.  Tongue normal. Mucous membranes moist.  RESPIRATORY wheezing and rhonchi on both sides CARDIOVASCULAR:  Regular rate and rhythm without murmur, rub or gallop. BREAST:  Right breast : Status post mastectomy.  No evidence of recurrent disease skin changes or nipple discharge.  Left breast without masses, skin changes or nipple discharge. ABDOMEN:  Soft, non-tender, with active bowel sounds, and no hepatosplenomegaly.  No masses. BACK:  No CVA  tenderness.  No tenderness on percussion of the back or rib cage. SKIN:  No rashes, ulcers  or lesions. EXTREMITIES: No edema, no skin discoloration or tenderness.  No palpable cords. Swelling of the left upper extremity LYMPH NODES: No palpable cervical, supraclavicular, axillary or inguinal adenopathy  NEUROLOGICAL: Unremarkable. PSYCH:  Appropriate.   LAB RESULTS:  Admission on 07/03/2015  No results displayed because visit has over 200 results.      Lab Results  Component Value Date   LABCA2 20.3 06/11/2014     STUDIES: Dg Chest 1 View  07/22/2015  CLINICAL DATA:  Recurrent left upper lobe lung cancer for which the patient is currently being treated, presenting with acutely worsening shortness of breath, cough and chest congestion. EXAM: Portable CHEST 1 VIEW COMPARISON:  CT chest 07/18/2015 and earlier.  PET-CT 06/14/2015. FINDINGS: Suboptimal inspiration. Cardiac silhouette upper normal in size. Dense airspace consolidation in the left upper lobe with air bronchograms, unchanged since the CT 4 days ago. Improved aeration in the right upper lobe, though patchy airspace opacities persist. Consolidation medially in the left lower lobe, unchanged. Small bilateral pleural effusions, unchanged. No new pulmonary parenchymal abnormalities. Left subclavian Port-A-Cath tip projects at or near the cavoatrial junction. IMPRESSION: Since the CT chest 4 days ago: 1. Improved aeration in the right upper lobe, though patchy airspace opacities persist indicating residual pneumonia. 2. 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 3. No change in the consolidation in the medial right lower lobe, likely post radiation pneumonitis. 4. No new abnormalities. Electronically Signed   By: Evangeline Dakin M.D.   On: 07/22/2015 19:33   Ct Angio Chest Pe W/cm &/or Wo Cm  07/18/2015  CLINICAL DATA:  Short of breath  for several weeks EXAM: CT ANGIOGRAPHY CHEST WITH CONTRAST TECHNIQUE: Multidetector CT imaging of the chest was performed using the standard protocol during bolus administration of intravenous contrast. Multiplanar CT image reconstructions and MIPs were obtained to evaluate the vascular anatomy. CONTRAST:  168m OMNIPAQUE IOHEXOL 350 MG/ML SOLN COMPARISON:  PET-CT 06/14/2015 FINDINGS: There are no filling defects in the pulmonary arterial tree to suggest acute pulmonary thromboembolism. Mediastinal adenopathy is not significantly changed since the PET-CT from 1 month ago. Left subclavian Port-A-Cath is stable. Consolidation within the apical left upper lobe and superior segment of the right lower lobe has markedly worsened. Very small bilateral pleural effusions have developed. No pneumothorax. The 1.4 cm left upper lobe lung mass on image 43 of series 8 is stable. Postoperative changes from right mastectomy are again noted. Bilateral rib fractures again noted. Right rib fractures are noticeably displaced. There are sclerotic areas in upper left antral lateral ribs without fractures which may represent rib lesions. There are also fractures and left mid to lower anterior ribs with some evidence of healing. These are all not significantly changed. Review of the MIP images confirms the above findings. IMPRESSION: No evidence of acute pulmonary thromboembolism Consolidation in the left upper lobe and superior segment of the right lower lobe has worsened. This may represent pneumonia, pneumonitis, or infiltration with tumor. New very small pleural effusions. Stable mediastinal adenopathy. Bilateral of rib fractures are again noted and are not significantly changed. They may represent pathologic fractures. Electronically Signed   By: AMarybelle KillingsM.D.   On: 07/18/2015 11:02   UK[place]Venous Img Upper Uni Left  07/03/2015  CLINICAL DATA:  Left upper extremity swelling x1 month. Diabetes. Left subclavian surgically placed  port catheter. EXAM: LEFT UPPER EXTREMITY VENOUS DOPPLER ULTRASOUND TECHNIQUE: Gray-scale sonography with graded  compression, as well as color Doppler and duplex ultrasound were performed to evaluate the upper extremity deep venous system from the level of the subclavian vein and including the jugular, axillary, basilic and upper cephalic vein. Spectral Doppler was utilized to evaluate flow at rest and with distal augmentation maneuvers. COMPARISON:  CT 07/18/2015 and previous FINDINGS: Thrombus within deep veins: Noncompressible thrombus in the axillary vein over short segment. Brachial, radial, ulnar, basilic, and cephalic veins remain patent and compressible, with somewhat monophasic waveforms probably related to the central obstruction. There is a monophasic antegrade waveform in the visualized subclavian vein, without evidence of transmitted right atrial pulsations. Other findings: Images of contralateral subclavian vein are unremarkable. IMPRESSION: 1. Isolated occlusive left axillary  DVT. Electronically Signed   By: Lucrezia Europe M.D.   On: 07/25/2015 16:44   Dg Chest Port 1 View  07/26/2015  CLINICAL DATA:  Respiratory failure. History of lung and breast cancer. EXAM: PORTABLE CHEST 1 VIEW COMPARISON:  07/22/2015 FINDINGS: Left-sided injectable central venous catheter is stable. Cardiomediastinal silhouette is normal. Mediastinal contours appear intact. There is no evidence of pneumothorax. Dense left upper lobe airspace consolidation/pulmonary mass is stable in appearance. There also remains a patchy airspace consolidation in the right upper lobe. No definite evidence of pleural effusion. Osseous structures are without acute abnormality. There has been a prior right mastectomy. IMPRESSION: No significant change in the appearance of the lungs with dense left upper lobe airspace consolidation and/or pulmonary mass, and patchy airspace consolidation in the right upper lobe. Electronically Signed   By:  Fidela Salisbury M.D.   On: 07/26/2015 09:31    ASSESSMENT: 1.  Patient has a history of lung cancer bilateral recently had stage III disease status post radiation chemotherapy now admitted with acute on chronic respiratory failure or suspected secondary to radiation pneumonitis On steroid and on   Empirical  antibiotics. On high flow oxygen Negative bronchus for any malignancy and cultures are negative Probably would need transthoracic biopsy or percutaneous CT-guided biopsy to rule out malignancy  Discussed situation with pulmonologist and it appears that patient might be on were just getting intubated because of requirement of   100% oxygen.  Left upper extremity swelling is improved  Patient can be transferred to intensive's service  Patient might help with me again in the hospital if agreeable for intravenous steroid, IV antibiotics and further evaluation with pulmonologist.  Repeat chest x-ray can be done tomorrow for further follow-up   Malignant neoplasm of female breast   Staging form: Breast, AJCC 7th Edition     Clinical: Stage IIIA (T3, N1, M0) - Signed by Forest Gleason, MD on 01/26/2015 Malignant neoplasm of upper lobe, bronchus or lung   Staging form: Lung, AJCC 7th Edition     Clinical: Stage IA (T1b, N0, M0) - Signed by Forest Gleason, MD on 01/26/2015   Forest Gleason, MD   07/29/2015 1:15 PM

## 2015-07-29 NOTE — Progress Notes (Signed)
Inpatient Diabetes Program Recommendations  AACE/ADA: New Consensus Statement on Inpatient Glycemic Control (2015)  Target Ranges:  Prepandial:   less than 140 mg/dL      Peak postprandial:   less than 180 mg/dL (1-2 hours)      Critically ill patients:  140 - 180 mg/dL  Results for Brittney Lam, Brittney Lam (MRN [id number]) as of 07/29/2015 14:39  Ref. Range 07/28/2015 07:12 07/28/2015 10:58 07/28/2015 16:10 07/28/2015 20:24 07/29/2015 07:05  Glucose-Capillary Latest Ref Range: 65-99 mg/dL 128 (H) 205 (H) 79 230 (H) 136 (H)   Review of Glycemic Control  Diabetes history: DM2 Outpatient Diabetes medications: Glipizide XL 5 mg daily Current orders for Inpatient glycemic control: Glipizide 5 mg QAM, Novolog 0-15 units TID with meals, Novolog 0-5 units HS  Inpatient Diabetes Program Recommendations: Diet: Glucose has ranged from 79-230 mg/dl over the past 24 hours. If appropriate, please consider changing diet from Regular to Carb Modified and may want to consider changing from Ensure to Glucerna supplements.  Note: Patient is ordered Solumedrol 60 mg TID and Prednisone 40 mg QAM which is contributing to hyperglycemia.   Thanks, Barnie Alderman, RN, MSN, CDE Diabetes Coordinator Inpatient Diabetes Program [phone] (Team Pager from [place] to [place]) [phone] ([facility] office) [phone] [facility] office) [phone] [facility] office)'

## 2015-07-29 NOTE — Progress Notes (Signed)
   07/29/15 1300  Clinical Encounter Type  Visited With Patient  Visit Type Initial  Referral From Nurse  Consult/Referral To Chaplain  Spiritual Encounters  Spiritual Needs Emotional  Stress Factors  Patient Stress Factors None identified  Chaplain rounded in the unit and offered a compassionate presence and support. Patient did not identify a need. Chaplain Windsor Zirkelbach A. Bonita [place] Ext. [phone]

## 2015-07-30 ENCOUNTER — Ambulatory Visit: Payer: 59 | Admitting: Oncology

## 2015-07-30 ENCOUNTER — Inpatient Hospital Stay: Payer: No Typology Code available for payment source

## 2015-07-30 ENCOUNTER — Other Ambulatory Visit: Payer: 59

## 2015-07-30 DIAGNOSIS — J189 Pneumonia, unspecified organism: Secondary | ICD-10-CM | POA: Diagnosis not present

## 2015-07-30 DIAGNOSIS — J9690 Respiratory failure, unspecified, unspecified whether with hypoxia or hypercapnia: Secondary | ICD-10-CM | POA: Insufficient documentation

## 2015-07-30 DIAGNOSIS — C3491 Malignant neoplasm of unspecified part of right bronchus or lung: Secondary | ICD-10-CM

## 2015-07-30 DIAGNOSIS — J9601 Acute respiratory failure with hypoxia: Secondary | ICD-10-CM | POA: Diagnosis not present

## 2015-07-30 DIAGNOSIS — J9620 Acute and chronic respiratory failure, unspecified whether with hypoxia or hypercapnia: Secondary | ICD-10-CM | POA: Diagnosis not present

## 2015-07-30 DIAGNOSIS — C3490 Malignant neoplasm of unspecified part of unspecified bronchus or lung: Secondary | ICD-10-CM | POA: Insufficient documentation

## 2015-07-30 DIAGNOSIS — J700 Acute pulmonary manifestations due to radiation: Secondary | ICD-10-CM | POA: Diagnosis not present

## 2015-07-30 DIAGNOSIS — J15212 Pneumonia due to Methicillin resistant Staphylococcus aureus: Secondary | ICD-10-CM | POA: Diagnosis not present

## 2015-07-30 DIAGNOSIS — C3412 Malignant neoplasm of upper lobe, left bronchus or lung: Secondary | ICD-10-CM | POA: Diagnosis not present

## 2015-07-30 LAB — GLUCOSE, CAPILLARY
GLUCOSE-CAPILLARY: 151 mg/dL — AB (ref 65–99)
Glucose-Capillary: 120 mg/dL — ABNORMAL HIGH (ref 65–99)
Glucose-Capillary: 166 mg/dL — ABNORMAL HIGH (ref 65–99)
Glucose-Capillary: 137 mg/dL — ABNORMAL HIGH (ref 65–99)

## 2015-07-30 MED ORDER — BOOST / RESOURCE BREEZE PO LIQD: 1.0000 | Freq: Three times a day (TID) | ORAL | Status: DC

## 2015-07-30 MED ORDER — PREDNISONE 20 MG PO TABS
60.0000 mg | ORAL_TABLET | Freq: Every day | ORAL | Status: DC
  Administered 2015-07-31 – 2015-08-06 (×7): 60 mg via ORAL
  Filled 2015-07-30 (×7): qty 3

## 2015-07-30 NOTE — Progress Notes (Signed)
[place] @ [facility] [phone]  Fax:(336) [place]: 17-Jun-1961  MR#: [id number]  ONG#:[id number]  Patient Care Team: [name], MD as PCP - General (Family Medicine) [name], MD (General Surgery) [name], RN as [place] Management [name], MD as Referring Physician (Family Medicine)  CHIEF COMPLAINT:  No chief complaint on file.  Oncology History   19. 54 year old female status post recent left breast cancer in 2005  2. Right breast cancer, locally advanced stage IIIa (pyT3, N1, M0), invasive mammary carcinoma with mucin production. Status post right modified radical mastectomy for 8 cm lesion with one of 4 sentinel lymph nodes positive for metastatic disease. Tumor is ER positive PR negative HER-2/neu not overexpressed. 3. Completed neoadjuvant chemotherapy with chest wall radiation therapy,  Finished on October 21, 2013. 4. Started letrazole 2.5 mg by mouth daily from October 24, 2013. 5. Carcinoma of lung.  Right upper lobe, status post right upper lobectomy, T1BN0M0. 6.  Left upper lobe mass with mediastinal lymph node biopsies positive for poorly differentiated adenocarcinoma T1 N1 M0 tumor III Starting chemoradiation therapy in July of 2016 (second primary) Patient had EUS in June of 2016.  Bilateral hilar adenopathy which has been biopsied was positive for metastatic adenocarcinoma consistent with lung primary Starting chemoradiation therapy in July of 2016 7.  Has finished 6 cycles of chemotherapy with carboplatinum and Taxol concurrent with radiation therapy on April 10, 2015       INTERVAL HISTORY:  54 year old African-American lady came today if as an acute heart on.  Has increasing shortness of breath started few days ago.  Also has developed left upper extremity swelling.\Patient is not using any oxygen at present time.  Oxygen saturation has dropped to 81% at rest.  Feeling  weak.  Tired. Declining performance status. This and continues to be on very high flow oxygen.  Somewhat tearful today.  According to her breathing is improved swelling has improved  Patient remains very apprehensive.  Left upper extremity swelling is improved. Review of systems  general status: Patient is feeling weak and tired.  No change in a performance status.  No chills.  No fever. HEENT: Difficulty swallowing. Lungs: Increasing shortness of breath.  Cough.  No hemoptysis.  Oxygen saturation dropped to 81%. Cardiac: No chest pain or paroxysmal nocturnal dyspnea GI: No nausea no vomiting no diarrhea no abdominal pain Skin: No rash Lower extremity no swelling Left upper extremity is swelling.  Has improved Neurological system: No tingling.  No numbness.  No other focal signs Musculoskeletal system no bony pains    PAST MEDICAL HISTORY: Past Medical History  Diagnosis Date  . Unspecified essential hypertension   . Kidney failure   . Personal history of tobacco use, presenting hazards to health   . Cataract   . Personal history of malignant neoplasm of breast   . Breast screening, unspecified   . Special screening for malignant neoplasms, colon   . Diabetes mellitus without complication ([place])   . MDRO (multiple drug resistant organisms) resistance   . H. pylori duodenitis   . Complication of anesthesia     BP DROPPED DURING LOBECTOMY IN 2014  . Cancer [facility]) 2001    left breast cancer s/p L/SN/R in 2001  . Cancer [facility]) 2014    right breast invasive CA, Er pos,Pr neg, Her 2 neg.  . Lung cancer, upper lobe ([place]) 2014    right upper lobe  .  Breast cancer ([place])     PAST SURGICAL HISTORY: Past Surgical History  Procedure Laterality Date  . Intercostal nerve block  2005  . Cataract extraction w/ intraocular lens implant  2005  . Carpal tunnel release Right 2011  . Insertion central venous access device w/ subcutaneous port  12-07-12  . Breast lumpectomy Left 2001  . Breast  surgery Right 2014    total mastectomy  . Right lung upper lobectomy  03/2013  . Lobectomy Right   . Cataract extraction    . Mastectomy Right   . Colonoscopy    . Endobronchial ultrasound N/A 02/05/2015    Procedure: ENDOBRONCHIAL ULTRASOUND;  Surgeon: Flora Lipps, MD;  Location: [facility] ORS;  Service: Cardiopulmonary;  Laterality: N/A;  . Flexible bronchoscopy N/A 07/20/2015    Procedure: FLEXIBLE BRONCHOSCOPY;  Surgeon: Allyne Gee, MD;  Location: [facility] ORS;  Service: Pulmonary;  Laterality: N/A;    FAMILY HISTORY Family History  Problem Relation Age of Onset  . Diabetes Other   . Hyperlipidemia Other   . Hypertension Other         ADVANCED DIRECTIVES: Patient does not have any advanced healthcare directive. Information has been given.   HEALTH MAINTENANCE: Social History  Substance Use Topics  . Smoking status: Former Smoker -- 0.50 packs/day for 30 years    Types: Cigarettes    Quit date: 02/03/2013  . Smokeless tobacco: Never Used     Comment: using 65m nicorette gum- 1/2 piece, 10 times per day  . Alcohol Use: No      Allergies  Allergen Reactions  . Metformin Diarrhea and Nausea Only  . Other Rash    Sage Wipes    Current Facility-Administered Medications  Medication Dose Route Frequency Provider Last Rate Last Dose  . acetaminophen (TYLENOL) tablet 650 mg  650 mg Oral Q4H PRN LEvlyn Kanner NP   650 mg at 07/29/15 2225  . butamben-tetracaine-benzocaine (CETACAINE) spray 1 spray  1 spray Topical Once SAllyne Gee MD      . Calcium Citrate-Vitamin D 315-250 MG-UNIT TABS 1 tablet  1 tablet Oral BID SFritzi Mandes MD   1 tablet at 07/29/15 2225  . enoxaparin (LOVENOX) injection 100 mg  1.5 mg/kg Subcutaneous Q24H GCammie Sickle MD   100 mg at 07/29/15 2015  . famotidine (PEPCID) tablet 20 mg  20 mg Oral BID PLaverle Hobby MD      . feeding supplement (ENSURE ENLIVE) (ENSURE ENLIVE) liquid 237 mL  237 mL Oral BID BM Vishal Mungal, MD   237 mL at  07/29/15 1000  . glipiZIDE (GLUCOTROL XL) 24 hr tablet 5 mg  5 mg Oral Q breakfast LEvlyn Kanner NP   5 mg at 07/30/15 0754  . guaiFENesin-dextromethorphan (ROBITUSSIN DM) 100-10 MG/5ML syrup 10 mL  10 mL Oral Q4H PRN LEvlyn Kanner NP   10 mL at 07/27/15 0929  . hydrochlorothiazide (HYDRODIURIL) tablet 12.5 mg  12.5 mg Oral Daily LEvlyn Kanner NP   12.5 mg at 07/29/15 0907  . insulin aspart (novoLOG) injection 0-15 Units  0-15 Units Subcutaneous TID WC GCammie Sickle MD   5 Units at 07/29/15 1208  . insulin aspart (novoLOG) injection 0-5 Units  0-5 Units Subcutaneous QHS GCammie Sickle MD   2 Units at 07/28/15 2208  . letrozole ([facility] tablet 2.5 mg  2.5 mg Oral Daily LEvlyn Kanner NP   2.5 mg at 07/29/15 1059  . levalbuterol (XOPENEX) nebulizer solution 1.25 mg  1.25 mg Nebulization Q4H PRN Vishal Mungal, MD      . lidocaine (XYLOCAINE) 2 % jelly 1 application  1 application Topical Once Allyne Gee, MD      . mupirocin ointment (BACTROBAN) 2 % 1 application  1 application Nasal BID Vishal Mungal, MD   1 application at 33/61/22 2226  . nystatin (MYCOSTATIN) 100000 UNIT/ML suspension 500,000 Units  5 mL Mouth/Throat QID Lequita Asal, MD   500,000 Units at 07/29/15 2224  . ondansetron (ZOFRAN) injection 4 mg  4 mg Intravenous Q6H PRN Cammie Sickle, MD   4 mg at 07/20/15 0121  . PARoxetine (PAXIL) tablet 10 mg  10 mg Oral QHS Evlyn Kanner, NP   10 mg at 07/29/15 2225  . phenylephrine (NEO-SYNEPHRINE) 0.25 % nasal spray 1 spray  1 spray Each Nare Q6H PRN Allyne Gee, MD      . potassium chloride SA (K-DUR,KLOR-CON) CR tablet 20 mEq  20 mEq Oral Daily Evlyn Kanner, NP   20 mEq at 07/29/15 0907  . predniSONE (DELTASONE) tablet 40 mg  40 mg Oral Q breakfast Vishal Mungal, MD      . senna-docusate (Senokot-S) tablet 1 tablet  1 tablet Oral QHS PRN Evlyn Kanner, NP      . simvastatin (ZOCOR) tablet 10 mg  10 mg Oral Daily Evlyn Kanner, NP   10  mg at 07/29/15 2224  . sodium chloride 0.9 % injection 10 mL  10 mL Intravenous PRN Cammie Sickle, MD   10 mL at 07/22/15 2010   Facility-Administered Medications Ordered in Other Encounters  Medication Dose Route Frequency Provider Last Rate Last Dose  . sodium chloride 0.9 % injection 10 mL  10 mL Intracatheter PRN Forest Gleason, MD   10 mL at 02/04/15 1027    OBJECTIVE:  Filed Vitals:   07/30/15 0700 07/30/15 0747  BP: 142/85   Pulse: 65   Temp:  98.4 F (36.9 C)  Resp: 21      Body mass index is 26.33 kg/(m^2).    ECOG FS:0 - Asymptomatic  PHYSICAL EXAM: GENERAL:  Performance status is declining Patient is in mild distress MENTAL STATUS:  Alert and oriented to person, place and time. HEAD:   Normocephalic, atraumatic, face symmetric, no Cushingoid features. EYES:    Pupils equal round and reactive to light and accomodation.  No conjunctivitis or scleral icterus. ENT:  Oropharynx clear without lesion.  Tongue normal. Mucous membranes moist.  RESPIRATORY wheezing and rhonchi on both sides CARDIOVASCULAR:  Regular rate and rhythm without murmur, rub or gallop. BREAST:  Right breast : Status post mastectomy.  No evidence of recurrent disease skin changes or nipple discharge.  Left breast without masses, skin changes or nipple discharge. ABDOMEN:  Soft, non-tender, with active bowel sounds, and no hepatosplenomegaly.  No masses. BACK:  No CVA tenderness.  No tenderness on percussion of the back or rib cage. SKIN:  No rashes, ulcers or lesions. EXTREMITIES: No edema, no skin discoloration or tenderness.  No palpable cords. Swelling of the left upper extremity LYMPH NODES: No palpable cervical, supraclavicular, axillary or inguinal adenopathy  NEUROLOGICAL: Unremarkable. PSYCH:  Appropriate.   LAB RESULTS:  Admission on 07/20/2015  No results displayed because visit has over 200 results.      Lab Results  Component Value Date   LABCA2 20.3 06/11/2014      STUDIES: Dg Chest 1 View  07/30/2015  CLINICAL DATA:  Chest pain. EXAM:  CHEST 1 VIEW COMPARISON:  07/26/2015. FINDINGS: Port-A-Cath noted with tip in stable position. Heart size stable. Persistent bilateral pulmonary infiltrates, particular prominent left upper lobe, no interim change. No pleural effusion or pneumothorax. No acute bony abnormality. IMPRESSION: 1. Port-A-Cath in stable position. 2. Persistent bilateral pulmonary infiltrates, particular prominent left upper lobe. No interim change. Electronically Signed   By: Marcello Moores  Register   On: 07/30/2015 07:46   Dg Chest 1 View  07/22/2015  CLINICAL DATA:  Recurrent left upper lobe lung cancer for which the patient is currently being treated, presenting with acutely worsening shortness of breath, cough and chest congestion. EXAM: Portable CHEST 1 VIEW COMPARISON:  CT chest 07/18/2015 and earlier.  PET-CT 06/14/2015. FINDINGS: Suboptimal inspiration. Cardiac silhouette upper normal in size. Dense airspace consolidation in the left upper lobe with air bronchograms, unchanged since the CT 4 days ago. Improved aeration in the right upper lobe, though patchy airspace opacities persist. Consolidation medially in the left lower lobe, unchanged. Small bilateral pleural effusions, unchanged. No new pulmonary parenchymal abnormalities. Left subclavian Port-A-Cath tip projects at or near the cavoatrial junction. IMPRESSION: Since the CT chest 4 days ago: 1. Improved aeration in the right upper lobe, though patchy airspace opacities persist indicating residual pneumonia. 2. 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 3. No change in the consolidation in the medial right lower lobe, likely post radiation pneumonitis. 4. No new abnormalities. Electronically Signed   By: Evangeline Dakin M.D.   On: 07/22/2015 19:33   Ct Angio Chest Pe W/cm &/or Wo Cm  07/18/2015   CLINICAL DATA:  Short of breath for several weeks EXAM: CT ANGIOGRAPHY CHEST WITH CONTRAST TECHNIQUE: Multidetector CT imaging of the chest was performed using the standard protocol during bolus administration of intravenous contrast. Multiplanar CT image reconstructions and MIPs were obtained to evaluate the vascular anatomy. CONTRAST:  146m OMNIPAQUE IOHEXOL 350 MG/ML SOLN COMPARISON:  PET-CT 06/14/2015 FINDINGS: There are no filling defects in the pulmonary arterial tree to suggest acute pulmonary thromboembolism. Mediastinal adenopathy is not significantly changed since the PET-CT from 1 month ago. Left subclavian Port-A-Cath is stable. Consolidation within the apical left upper lobe and superior segment of the right lower lobe has markedly worsened. Very small bilateral pleural effusions have developed. No pneumothorax. The 1.4 cm left upper lobe lung mass on image 43 of series 8 is stable. Postoperative changes from right mastectomy are again noted. Bilateral rib fractures again noted. Right rib fractures are noticeably displaced. There are sclerotic areas in upper left antral lateral ribs without fractures which may represent rib lesions. There are also fractures and left mid to lower anterior ribs with some evidence of healing. These are all not significantly changed. Review of the MIP images confirms the above findings. IMPRESSION: No evidence of acute pulmonary thromboembolism Consolidation in the left upper lobe and superior segment of the right lower lobe has worsened. This may represent pneumonia, pneumonitis, or infiltration with tumor. New very small pleural effusions. Stable mediastinal adenopathy. Bilateral of rib fractures are again noted and are not significantly changed. They may represent pathologic fractures. Electronically Signed   By: AMarybelle KillingsM.D.   On: 07/18/2015 11:02   UKoreaVenous Img Upper Uni Left  07/28/2015  CLINICAL DATA:  Left upper extremity swelling x1 month. Diabetes. Left  subclavian surgically placed port catheter. EXAM: LEFT UPPER EXTREMITY VENOUS DOPPLER ULTRASOUND TECHNIQUE: Gray-scale sonography with graded compression, as well  as color Doppler and duplex ultrasound were performed to evaluate the upper extremity deep venous system from the level of the subclavian vein and including the jugular, axillary, basilic and upper cephalic vein. Spectral Doppler was utilized to evaluate flow at rest and with distal augmentation maneuvers. COMPARISON:  CT 07/18/2015 and previous FINDINGS: Thrombus within deep veins: Noncompressible thrombus in the axillary vein over short segment. Brachial, radial, ulnar, basilic, and cephalic veins remain patent and compressible, with somewhat monophasic waveforms probably related to the central obstruction. There is a monophasic antegrade waveform in the visualized subclavian vein, without evidence of transmitted right atrial pulsations. Other findings: Images of contralateral subclavian vein are unremarkable. IMPRESSION: 1. Isolated occlusive left axillary  DVT. Electronically Signed   By: Lucrezia Europe M.D.   On: 07/26/2015 16:44   Dg Chest Port 1 View  07/26/2015  CLINICAL DATA:  Respiratory failure. History of lung and breast cancer. EXAM: PORTABLE CHEST 1 VIEW COMPARISON:  07/22/2015 FINDINGS: Left-sided injectable central venous catheter is stable. Cardiomediastinal silhouette is normal. Mediastinal contours appear intact. There is no evidence of pneumothorax. Dense left upper lobe airspace consolidation/pulmonary mass is stable in appearance. There also remains a patchy airspace consolidation in the right upper lobe. No definite evidence of pleural effusion. Osseous structures are without acute abnormality. There has been a prior right mastectomy. IMPRESSION: No significant change in the appearance of the lungs with dense left upper lobe airspace consolidation and/or pulmonary mass, and patchy airspace consolidation in the right upper lobe.  Electronically Signed   By: Fidela Salisbury M.D.   On: 07/26/2015 09:31    ASSESSMENT: 1.  Patient has a history of lung cancer bilateral recently had stage III disease status post radiation chemotherapy now admitted with acute on chronic respiratory failure or suspected secondary to radiation pneumonitis On steroid and on   Empirical  antibiotics. On high flow oxygen Negative bronchus for any malignancy and cultures are negative Probably would need transthoracic biopsy or percutaneous CT-guided biopsy to rule out malignancy  Discussed situation with pulmonologist and it appears that patient might be on were just getting intubated because of requirement of   100% oxygen.  Left upper extremity swelling is improved   Chest x-ray shows essentially no change as been reviewed independently  I discussed with the patient.  She is  in agreement that she does not want any CPR or intubation but would like to continue supportive measures with steroid IV antibiotics.  Pressor support.  Will put patient on limited code.  I discussed situation with pulmonologist and depending on the oxygen requirement patient can be transferred to the regular floor with physiotherapy.  Anticoagulation by mouth  Continue IV antibiotic steroid and aggressive bronchodilator therapy all lab data has been reviewed  Recheck blood test tomorrow Malignant neoplasm of female breast   Staging form: Breast, AJCC 7th Edition     Clinical: Stage IIIA (T3, N1, M0) - Signed by Forest Gleason, MD on 01/26/2015 Malignant neoplasm of upper lobe, bronchus or lung   Staging form: Lung, AJCC 7th Edition     Clinical: Stage IA (T1b, N0, M0) - Signed by Forest Gleason, MD on 01/26/2015   Forest Gleason, MD   07/30/2015 8:39 AM

## 2015-07-30 NOTE — Progress Notes (Signed)
[place] at [place] NAME: Brittney Lam    MR#:  [id number]  DATE OF BIRTH:  [birth year]  SUBJECTIVE:   Feeling better today. On high flow nasal cannula 80% Sats 91-93%  REVIEW OF SYSTEMS:   Review of Systems  Constitutional: Negative for fever, chills and weight loss.  HENT: Negative for ear discharge, ear pain and nosebleeds.   Eyes: Negative for blurred vision, pain and discharge.  Respiratory: Positive for cough, sputum production and shortness of breath. Negative for wheezing and stridor.   Cardiovascular: Negative for chest pain, palpitations, orthopnea and PND.  Gastrointestinal: Negative for nausea, vomiting, abdominal pain and diarrhea.  Genitourinary: Negative for urgency and frequency.  Musculoskeletal: Negative for back pain and joint pain.  Neurological: Positive for weakness. Negative for sensory change, speech change and focal weakness.  Psychiatric/Behavioral: Negative for depression and hallucinations. The patient is not nervous/anxious.   All other systems reviewed and are negative.  Tolerating Diet:yes Tolerating PT: not seen yet since on HFNC  DRUG ALLERGIES:   Allergies  Allergen Reactions  . Metformin Diarrhea and Nausea Only  . Other Rash    Sage Wipes    VITALS:  Blood pressure 115/71, pulse 74, temperature 97.7 F (36.5 C), temperature source Axillary, resp. rate 21, height '5\' 2"'$  (1.575 m), weight 144 lb (65.318 kg), SpO2 90 %.  PHYSICAL EXAMINATION:   Physical Exam  GENERAL:  54 y.o.-year-old patient lying in the bed with mild  acute distress.  EYES: Pupils equal, round, reactive to light and accommodation. No scleral icterus. Extraocular muscles intact.  HEENT: Head atraumatic, normocephalic. Oropharynx and nasopharynx clear.  NECK:  Supple, no jugular venous distention. No thyroid enlargement, no tenderness.  LUNGS: distsnt breath sounds bilaterally, rales, rhonchi. No use of accessory  muscles of respiration. Scattered wheezing CARDIOVASCULAR: S1, S2 normal. No murmurs, rubs, or gallops. Mild tachycardia ABDOMEN: Soft, nontender, nondistended. Bowel sounds present. No organomegaly or mass.  EXTREMITIES: No cyanosis, clubbing or edema b/l.    NEUROLOGIC: Cranial nerves II through XII are intact. No focal Motor or sensory deficits b/l.   PSYCHIATRIC: The patient is alert and oriented x 3.  SKIN: No obvious rash, lesion, or ulcer.    LABORATORY PANEL:   CBC  Recent Labs Lab 07/29/15 0533  WBC 19.9*  HGB 11.1*  HCT 33.2*  PLT 271    Chemistries   Recent Labs Lab 07/29/15 0533  NA 136  K 3.5  CL 101  CO2 29  GLUCOSE 179*  BUN 26*  CREATININE 0.96  CALCIUM 9.3    Cardiac Enzymes No results for input(s): TROPONINI in the last 168 hours.  RADIOLOGY:  Dg Chest 1 View  07/30/2015  CLINICAL DATA:  Chest pain. EXAM: CHEST 1 VIEW COMPARISON:  07/26/2015. FINDINGS: Port-A-Cath noted with tip in stable position. Heart size stable. Persistent bilateral pulmonary infiltrates, particular prominent left upper lobe, no interim change. No pleural effusion or pneumothorax. No acute bony abnormality. IMPRESSION: 1. Port-A-Cath in stable position. 2. Persistent bilateral pulmonary infiltrates, particular prominent left upper lobe. No interim change. Electronically Signed   By: Marcello Moores  Register   On: 07/30/2015 07:46     ASSESSMENT AND PLAN:  Brittney Lam is a 54 y.o. female with a known history of hypertension, diabetes, lung cancer in left upper lobe and in past had breast cancer status post radiation and surgery- admitted for last few days under oncology service for respiratory distress and is being treated for  possible radiation pneumonitis or pneumonia.  * Acute respiratory failure with hypoxia Left upper lobe lung cancer, radiation pneumonitis and bilateral pneumonia BAL is negative 07/30/2015)  On broad-spectrum antibiotic coverage with IV vanc, zithromax  and  zosyn --completed abxs Scheduled Neb and IV steroid (high doses for possible radiation pneumonitis) -HFNC and Bipap prn Pt is full code(readdressed)  * Diabetes  On glipizide and Insulin sliding coverage.  * Sinus tachycardia  Likely due to Hypoxia.  * Lung cancer Last chemo and radiation several weeks ago  * Hyperlipidemia  Zocor.  Case discussed with Care Management/Social Worker. Management plans discussed with the patient, family and they are in agreement.  CODE STATUS: full  DVT Prophylaxis: lovenox  TOTAL  TIME TAKING CARE OF THIS PATIENT: 30 minutes.  >50% time spent on counselling and coordination of care  POSSIBLE D/C IN fewDAYS, DEPENDING ON CLINICAL CONDITION.   Brittney Lam M.D on 07/30/2015 at 4:28 PM  Between 7am to 6pm - Pager - [phone]  After 6pm go to [email or web address] - password [email or web address] [facility]  [place] [facility]  Office  [phone]  CC: Primary care physician; [name], MD

## 2015-07-30 NOTE — Progress Notes (Addendum)
Nutrition Follow-up    INTERVENTION:   Meals and Snacks: Cater to patient preferences; pt reports she cannot tolerate regular milk, drinks lactaid milk instead. Pt can tolerate other milk products just fine. Pt may benefit from smaller, more frequent meals, pt agreeable to snacks. Pt on regular diet, pt may have family/friends bring in outside food if she prefers Medical Food Supplement Therapy: pt reports Ensure upsets her stomach and gives her diarrhea; pt reports she has tried mighty shakes in the past as well and does not tolerate these. Pt cannot tolerate regular milk so [employer] not a good option. Pt is agreeable to trying  Boost Breeze (Clear Liquid supplement); will send TID  NUTRITION DIAGNOSIS:   Unintentional weight loss related to chronic illness as evidenced by per patient/family report.  GOAL:   Patient will meet greater than or equal to 90% of their needs   MONITOR:    (Energy intake, Anthropometrics)  REASON FOR ASSESSMENT:   Consult    ASSESSMENT:    Pt remains on HFNC, code status changed to DNR.   Diet Order:  Diet regular Room service appropriate?: Yes; Fluid consistency:: Thin   Energy Intake: recorded po intake 30% of meals on average, pt not drinking Ensure as she reports it causes her tummy upset and diarrhea. Pt reports she is not eating well because she does not like the food she is receiving; pt has not been ordering meals, reports she does like the [place] but this is getting old. Family has not been bringing in food for pt because pt thought this was not allowed  Skin:  Reviewed, no issues  Electrolyte and Renal Profile:  Recent Labs Lab 07/27/15 0537 07/28/15 0541 07/29/15 0533  BUN 15 20 26*  CREATININE 1.00 0.96 0.96  NA 135 137 136  K 3.7 3.6 3.5   Glucose Profile:  Recent Labs  07/29/15 2128 07/30/15 0726 07/30/15 1110  GLUCAP 153* 120* 151*   Meds: glucotrol, ss novolog,prednisone, solumedrol   Height:    Ht Readings from Last 1 Encounters:  07/11/2015 '5\' 2"'$  (1.575 m)    Weight:   Wt Readings from Last 1 Encounters:  07/03/2015 144 lb (65.318 kg)    BMI:  Body mass index is 26.33 kg/(m^2).  Estimated Nutritional Needs:   Kcal:  1625-1950 kcals   Protein:  72-85 g (1.1-1.3 g/kg)   Fluid:  1950-2275 mL (30-35 ml/kg)   EDUCATION NEEDS:   No education needs identified at this time  [place], [place], LDN [phone] Pager

## 2015-07-30 NOTE — Progress Notes (Signed)
[facility] Progess Note    ASSESSMENT/PLAN   54 year old female with bilateral pneumonia, recurrent left upper lobe lung cancer, shortness of breath, admitted to the ICU for respiratory distress and hypoxia, suspect due to advancing cancer vs. Organizing pneumonia.   PULMONARY Respiratory failure-likely due to advancing cancer versus organizing pneumonia, patient was discussed with oncology. It would appear that radiation pneumonitis or fibrosis is less likely. Recurrent left upper lobe lung cancer   P:  - tolerating HFNC this morning, the patient has been having trouble tolerating BiPAP. Therefore, we'll continue on high flow nasal cannula.  -Maintain O2 saturations greater than 88% -Vancomycin/zosyn for 10 days total, a Zithromax and for 5 days total - stopped scheduled nebs due to rebound anxiety and bronchospams, xopenox PRN -Maintain current dose of prednisone. -10 days total of antibiotics -Patient does have a mild anxiety component with her respiratory distress, 1 mg morphine IV every 6 hours for shortness of breath and chest pain 3 days -Lung cancer/stage III status post chemoradiation, finished 04/10/2015; clinically patient fits time window for radiation pneumonitis and will treat as such  Overall, her prognosis appears to be poor, Dr.Choksi discussed her situation today with the patient and her CODE STATUS was changed to DO NOT RESUSCITATE. I reviewed this with the patient and she confirmed DO NOT RESUSCITATE status.  CARDIOVASCULAR -Continue to monitor hemodynamic status   RENAL -Monitor electrolytes -ICU to try protocol replacement  GASTROINTESTINAL SUP - PPI  HEMATOLOGIC History of breast cancer Stage III left upper lobe lung cancer -Status post chemoradiation 04/10/2015. The patient also received chest wall radiation on the left side for breast cancer in 2015.  -Hematology/oncology following -Continue with monitoring of  CBC   INFECTIOUS Bilateral pneumonia -Continue with current antibiotics, for a total of 10 days -BAL with normal flora   ENDOCRINE -ssi  NEUROLOGIC RASS goal: 0 ---------------------------------------   ----------------------------------------   Name: Brittney Lam MRN: [id number] DOB: Mar 11, 1961    ADMISSION DATE:  07/08/2015    CHIEF COMPLAINT. Dyspnea   Subjective Patient has no particular complaints today, she is awake, alert and conversational. She does not appear to be particularly dyspneic, even though she is requiring a high amount of oxygen.  Review of Systems:  The patient denies chest pain, PND, cough, expectoration. The remainder of the review systems was reviewed with this patient and found to be negative.    VITAL SIGNS: Temp:  [97.7 F (36.5 C)-98.9 F (37.2 C)] 97.7 F (36.5 C) (11/29 1124) Pulse Rate:  [62-115] 77 (11/29 1200) Resp:  [19-30] 25 (11/29 1200) BP: (109-157)/(71-93) 140/81 mmHg (11/29 1200) SpO2:  [80 %-100 %] 90 % (11/29 1200) FiO2 (%):  [76 %-90 %] 76 % (11/29 1003) HEMODYNAMICS:   VENTILATOR SETTINGS: Vent Mode:  [-]  FiO2 (%):  [76 %-90 %] 76 % INTAKE / OUTPUT:  Intake/Output Summary (Last 24 hours) at 07/30/15 1345 Last data filed at 07/30/15 1124  Gross per 24 hour  Intake    480 ml  Output   1800 ml  Net  -1320 ml    PHYSICAL EXAMINATION: Physical Examination:   VS: BP 140/81 mmHg  Pulse 77  Temp(Src) 97.7 F (36.5 C) (Axillary)  Resp 25  Ht '5\' 2"'$  (1.575 m)  Wt 65.318 kg (144 lb)  BMI 26.33 kg/m2  SpO2 90%  General Appearance: No distress  Neuro:without focal findings, mental status normal. HEENT: PERRLA, EOM intact. Pulmonadecreased breath sounds bilaterally.rdiovascularNormal S1,S2.  No m/r/g.  Abdomen: Benign, Soft, non-tender. Renal:  No costovertebral tenderness  GU:  Not performed at this time. Endocrine: No evident thyromegaly. Skin:   warm, no rashes, no ecchymosis  Extremities: normal,  no cyanosis, clubbing.   LABS:   LABORATORY PANEL:   CBC  Recent Labs Lab 07/29/15 0533  WBC 19.9*  HGB 11.1*  HCT 33.2*  PLT 271    Chemistries   Recent Labs Lab 07/29/15 0533  NA 136  K 3.5  CL 101  CO2 29  GLUCOSE 179*  BUN 26*  CREATININE 0.96  CALCIUM 9.3     Recent Labs Lab 07/29/15 0705 07/29/15 1151 07/29/15 1625 07/29/15 2128 07/30/15 0726 07/30/15 1110  GLUCAP 136* 211* 76 153* 120* 151*   No results for input(s): PHART, PCO2ART, PO2ART in the last 168 hours. No results for input(s): AST, ALT, ALKPHOS, BILITOT, ALBUMIN in the last 168 hours.  Cardiac Enzymes No results for input(s): TROPONINI in the last 168 hours.  RADIOLOGY:  Dg Chest 1 View  07/30/2015  CLINICAL DATA:  Chest pain. EXAM: CHEST 1 VIEW COMPARISON:  07/26/2015. FINDINGS: Port-A-Cath noted with tip in stable position. Heart size stable. Persistent bilateral pulmonary infiltrates, particular prominent left upper lobe, no interim change. No pleural effusion or pneumothorax. No acute bony abnormality. IMPRESSION: 1. Port-A-Cath in stable position. 2. Persistent bilateral pulmonary infiltrates, particular prominent left upper lobe. No interim change. Electronically Signed   By: Marcello Moores  Register   On: 07/30/2015 07:46       --Marda Stalker, MD.  [facility]  Patricia Pesa, M.D.  Vilinda Boehringer, M.D.  Merton Border, M.D  [place].  I have personally obtained a history, examined the patient, evaluated laboratory and imaging results, formulated the assessment and plan and placed orders. The case was discussed with the critical care RN, nurse manager, nutrition, ICU pharmacist. The Patient requires high complexity decision making for assessment and support, frequent evaluation and titration of therapies, application of advanced monitoring technologies and extensive interpretation of multiple databases. The patient has critical illness that  could lead imminently to failure of 1 or more organ systems and requires the highest level of physician preparedness to intervene.  Critical Care Time devoted to patient care services described in this note is 35 minutes and is exclusive of time spent in procedures.

## 2015-07-30 NOTE — Progress Notes (Signed)
Patient reports she is coughing a .,ot and she desats to 85% when she coughs. PRN cough syrup given.

## 2015-07-30 NOTE — Progress Notes (Signed)
[facility] PROGRESS NOTE Date of Admission:  07/16/2015     ID: [name] is a 54 y.o. female with  Lung cancer and diffuse PNA  Active Problems:   Pneumonia   Left upper lobe pneumonia  Subjective: She remains in icu, on high flo O2, no fevers, finished 10 d abx course. Min sputum production. On hi dose steroids   ROS  Eleven systems are reviewed and negative except per hpi  Medications:  Antibiotics Given (last 72 hours)    Date/Time Action Medication Dose Rate   07/27/15 1919 Given   piperacillin-tazobactam (ZOSYN) IVPB 3.375 g 3.375 g 12.5 mL/hr   07/28/15 0056 Given   vancomycin (VANCOCIN) IVPB 1000 mg/200 mL premix 1,000 mg 200 mL/hr   07/28/15 0240 Given   piperacillin-tazobactam (ZOSYN) IVPB 3.375 g 3.375 g 12.5 mL/hr   07/28/15 1035 Given   piperacillin-tazobactam (ZOSYN) IVPB 3.375 g 3.375 g 12.5 mL/hr   07/28/15 1210 Given   vancomycin (VANCOCIN) IVPB 1000 mg/200 mL premix 1,000 mg 200 mL/hr   07/28/15 1805 Given   piperacillin-tazobactam (ZOSYN) IVPB 3.375 g 3.375 g 12.5 mL/hr   07/29/15 0045 Given   vancomycin (VANCOCIN) IVPB 1000 mg/200 mL premix 1,000 mg 200 mL/hr   07/29/15 0300 Given   piperacillin-tazobactam (ZOSYN) IVPB 3.375 g 3.375 g 12.5 mL/hr     . butamben-tetracaine-benzocaine  1 spray Topical Once  . Calcium Citrate-Vitamin D  1 tablet Oral BID  . enoxaparin (LOVENOX) injection  1.5 mg/kg Subcutaneous Q24H  . famotidine  20 mg Oral BID  . feeding supplement (ENSURE ENLIVE)  237 mL Oral BID BM  . glipiZIDE  5 mg Oral Q breakfast  . hydrochlorothiazide  12.5 mg Oral Daily  . insulin aspart  0-15 Units Subcutaneous TID WC  . insulin aspart  0-5 Units Subcutaneous QHS  . letrozole  2.5 mg Oral Daily  . lidocaine  1 application Topical Once  . mupirocin ointment  1 application Nasal BID  . nystatin  5 mL Mouth/Throat QID  . PARoxetine  10 mg Oral QHS  . potassium chloride SA  20 mEq Oral Daily  . [START ON  07/31/2015] predniSONE  60 mg Oral Q breakfast  . simvastatin  10 mg Oral Daily    Objective: Vital signs in last 24 hours: Temp:  [97.7 F (36.5 C)-98.9 F (37.2 C)] 97.7 F (36.5 C) (11/29 1124) Pulse Rate:  [62-115] 77 (11/29 1200) Resp:  [19-30] 25 (11/29 1200) BP: (109-157)/(71-93) 140/81 mmHg (11/29 1200) SpO2:  [80 %-100 %] 90 % (11/29 1200) FiO2 (%):  [76 %-90 %] 76 % (11/29 1003) Constitutional: oriented to person, place, and time. Dyspneic HENT: [place]/AT, PERRLA, no scleral icterus Mouth/Throat: Oropharynx is clear and moist. No oropharyngeal exudate.  Cardiovascular: tachy Pulmonary/Chest: bronchial breath sounds Lul, rhonchi throughout  Neck = supple, no nuchal rigidity Abdominal: Soft. Bowel sounds are normal. exhibits no distension. There is no tenderness.  Lymphadenopathy: no cervical adenopathy. No axillary adenopathy Neurological: alert and oriented to person, place, and time.  Skin: Skin is warm and dry. No rash noted. No erythema.  Psychiatric: a normal mood and affect. behavior is normal.  Access portacath L chest wall wnl  Lab Results  Recent Labs  07/28/15 0541 07/29/15 0533  WBC 21.3* 19.9*  HGB 10.4* 11.1*  HCT 31.8* 33.2*  NA 137 136  K 3.6 3.5  CL 101 101  CO2 30 29  BUN 20 26*  CREATININE 0.96 0.96  Microbiology:  Results for orders placed or performed during the hospital encounter of 07/21/2015  Culture, sputum-assessment     Status: None   Collection Time: 07/20/15  7:33 AM  Result Value Ref Range Status   Specimen Description EXPECTORATED SPUTUM  Final   Special Requests NONE  Final   Sputum evaluation   Final    Sputum specimen not acceptable for testing.  Please recollect.   Results Called to: [facility] AT 3762 ON 07/20/15 CTJ    Report Status 07/20/2015 FINAL  Final  Fungus Culture with Smear     Status: None (Preliminary result)   Collection Time: 07/08/2015  9:58 AM  Result Value Ref Range Status   Specimen  Description BRONCHIAL WASHINGS  Final   Special Requests NONE  Final   Culture CANDIDA ALBICANS  Final   Report Status PENDING  Incomplete  Culture, respiratory (NON-Expectorated)     Status: None   Collection Time: 07/31/2015  9:58 AM  Result Value Ref Range Status   Specimen Description BRONCHIAL WASHINGS  Final   Special Requests WASHING RIGHT  Final   Culture Consistent with normal respiratory flora.  Final   Report Status 07/26/2015 FINAL  Final  MRSA PCR Screening     Status: Abnormal   Collection Time: 07/26/15  9:01 AM  Result Value Ref Range Status   MRSA by PCR POSITIVE (A) NEGATIVE Final    Comment:        The GeneXpert MRSA Assay (FDA approved for NASAL specimens only), is one component of a comprehensive MRSA colonization surveillance program. It is not intended to diagnose MRSA infection nor to guide or monitor treatment for MRSA infections. CRITICAL RESULT CALLED TO, READ BACK BY AND VERIFIED WITH: KENDRA SIMSER ON 07/26/15 AT 1036 BY QSD     Studies/Results: Dg Chest 1 View  07/30/2015  CLINICAL DATA:  Chest pain. EXAM: CHEST 1 VIEW COMPARISON:  07/26/2015. FINDINGS: Port-A-Cath noted with tip in stable position. Heart size stable. Persistent bilateral pulmonary infiltrates, particular prominent left upper lobe, no interim change. No pleural effusion or pneumothorax. No acute bony abnormality. IMPRESSION: 1. Port-A-Cath in stable position. 2. Persistent bilateral pulmonary infiltrates, particular prominent left upper lobe. No interim change. Electronically Signed   By: Marcello Moores  Register   On: 07/30/2015 07:46    Assessment/Plan: ELLER SWEIS is a 54 y.o. female with lung cancer s/p chemoradiation with cough, sob, hemoptysis. CT shows extensive infiltrate and consolidation. Unable to produce sputum.  Interestingly she had a process noted in CT PET Oct 14th. This has clearly progressed.  I see mention of her being started on prednisone in Oct 14th onc visit for  possible radiation pneumonitis. Had dry hacking cough at that time.  I suspect radiation pneumonitis but bacterial, atypical, fungal or mycobacterial infection could also be present. She had bronch done with scant secretions, erythema noted. Cultures with routine flora, fungal cx with candida albicans, afb neg to date. Legionella urine negative. She has finished 10 days vanco, zosyn and 5 of azithro. Off abx since 11/28.  ON hi dose pred.   Recommendations Continue treatment per pulm and onc for most likely radiation pneumonitis  I will sign off now but please call with questions or concerns Thank you very much for the consult. Will follow with you.  [place], [place]   07/30/2015, 1:35 PM

## 2015-07-31 ENCOUNTER — Inpatient Hospital Stay: Payer: No Typology Code available for payment source

## 2015-07-31 DIAGNOSIS — J189 Pneumonia, unspecified organism: Secondary | ICD-10-CM | POA: Diagnosis not present

## 2015-07-31 DIAGNOSIS — J700 Acute pulmonary manifestations due to radiation: Secondary | ICD-10-CM | POA: Diagnosis not present

## 2015-07-31 DIAGNOSIS — C3412 Malignant neoplasm of upper lobe, left bronchus or lung: Secondary | ICD-10-CM | POA: Diagnosis not present

## 2015-07-31 DIAGNOSIS — Z923 Personal history of irradiation: Secondary | ICD-10-CM

## 2015-07-31 DIAGNOSIS — M7989 Other specified soft tissue disorders: Secondary | ICD-10-CM

## 2015-07-31 DIAGNOSIS — J9600 Acute respiratory failure, unspecified whether with hypoxia or hypercapnia: Secondary | ICD-10-CM | POA: Diagnosis not present

## 2015-07-31 DIAGNOSIS — C3490 Malignant neoplasm of unspecified part of unspecified bronchus or lung: Secondary | ICD-10-CM | POA: Diagnosis not present

## 2015-07-31 DIAGNOSIS — C3431 Malignant neoplasm of lower lobe, right bronchus or lung: Secondary | ICD-10-CM | POA: Diagnosis not present

## 2015-07-31 DIAGNOSIS — Z9221 Personal history of antineoplastic chemotherapy: Secondary | ICD-10-CM

## 2015-07-31 DIAGNOSIS — C50512 Malignant neoplasm of lower-outer quadrant of left female breast: Secondary | ICD-10-CM | POA: Insufficient documentation

## 2015-07-31 DIAGNOSIS — J701 Chronic and other pulmonary manifestations due to radiation: Secondary | ICD-10-CM | POA: Diagnosis not present

## 2015-07-31 DIAGNOSIS — D72829 Elevated white blood cell count, unspecified: Secondary | ICD-10-CM

## 2015-07-31 LAB — CBC WITH DIFFERENTIAL/PLATELET
Basophils Absolute: 0.1 10*3/uL (ref 0–0.1)
Basophils Relative: 0 %
EOS ABS: 0.2 10*3/uL (ref 0–0.7)
Eosinophils Relative: 1 %
HCT: 37.6 % (ref 35.0–47.0)
HEMOGLOBIN: 12.3 g/dL (ref 12.0–16.0)
LYMPHS ABS: 0.8 10*3/uL — AB (ref 1.0–3.6)
LYMPHS PCT: 3 %
MCH: 30 pg (ref 26.0–34.0)
MCHC: 32.7 g/dL (ref 32.0–36.0)
MCV: 91.9 fL (ref 80.0–100.0)
MONOS PCT: 3 %
Monocytes Absolute: 0.7 10*3/uL (ref 0.2–0.9)
NEUTROS PCT: 93 %
Neutro Abs: 24.3 10*3/uL — ABNORMAL HIGH (ref 1.4–6.5)
Platelets: 316 10*3/uL (ref 150–440)
RBC: 4.09 MIL/uL (ref 3.80–5.20)
RDW: 15.7 % — ABNORMAL HIGH (ref 11.5–14.5)
WBC: 26.1 10*3/uL — AB (ref 3.6–11.0)

## 2015-07-31 LAB — COMPREHENSIVE METABOLIC PANEL
ALK PHOS: 114 U/L (ref 38–126)
ALT: 17 U/L (ref 14–54)
ANION GAP: 9 (ref 5–15)
AST: 15 U/L (ref 15–41)
Albumin: 2.9 g/dL — ABNORMAL LOW (ref 3.5–5.0)
BILIRUBIN TOTAL: 0.2 mg/dL — AB (ref 0.3–1.2)
BUN: 25 mg/dL — ABNORMAL HIGH (ref 6–20)
CALCIUM: 10 mg/dL (ref 8.9–10.3)
CO2: 30 mmol/L (ref 22–32)
CREATININE: 0.93 mg/dL (ref 0.44–1.00)
Chloride: 99 mmol/L — ABNORMAL LOW (ref 101–111)
Glucose, Bld: 95 mg/dL (ref 65–99)
Potassium: 3.4 mmol/L — ABNORMAL LOW (ref 3.5–5.1)
SODIUM: 138 mmol/L (ref 135–145)
TOTAL PROTEIN: 7 g/dL (ref 6.5–8.1)

## 2015-07-31 LAB — GLUCOSE, CAPILLARY
GLUCOSE-CAPILLARY: 100 mg/dL — AB (ref 65–99)
GLUCOSE-CAPILLARY: 171 mg/dL — AB (ref 65–99)
GLUCOSE-CAPILLARY: 213 mg/dL — AB (ref 65–99)
Glucose-Capillary: 175 mg/dL — ABNORMAL HIGH (ref 65–99)

## 2015-07-31 MED ORDER — CITALOPRAM HYDROBROMIDE 20 MG PO TABS
20.0000 mg | ORAL_TABLET | Freq: Every day | ORAL | Status: DC
  Administered 2015-07-31 – 2015-08-06 (×7): 20 mg via ORAL
  Filled 2015-07-31 (×7): qty 1

## 2015-07-31 NOTE — Progress Notes (Signed)
[facility] Progess Note    ASSESSMENT/PLAN   54 year old female with bilateral pneumonia, recurrent left upper lobe lung cancer, shortness of breath, admitted to the ICU for respiratory distress and hypoxia  PULMONARY Respiratory failure-hypoxia Recurrent left upper lobe lung cancer -Given the patient's severe refractory hypoxemia with worsening respiratory failure, I suspect this is likely due to radiation fibrosis and/or advancing cancer. -The patient has not responded to empiric steroids and antibiotics. This makes it less likely that the patient's respiratory failure is due to pneumonia or reversible radiation pneumonitis.    P:  -Given her continued lack of response to empiric therapy, now requiring nearly maximal amounts of high flow oxygen. May need to consider hospice or palliative care.  - tolerating HFNC this morning, the patient has been having trouble tolerating BiPAP. Therefore, we'll continue on high flow nasal cannula. The patient has been requiring increasing amounts of high flow nasal cannula. -Maintain O2 saturations greater than 88% -Vancomycin/zosyn for 10 days total, a Zithromax and for 5 days total - stopped scheduled nebs due to rebound anxiety and bronchospams, xopenox PRN -oral steroids IV, 60 every 63 days then transition back to prednisone 40 mg daily until outpatient follow-up with pulmonologist. -Status post bronchoscopy with BAL on 11/23 - cultures negative to date -10 days total of antibiotics -Patient does have a mild anxiety component with her respiratory distress, 1 mg morphine IV every 6 hours for shortness of breath and chest pain 3 days -Lung cancer/stage III status post chemoradiation, finished 04/10/2015; clinically patient fits time window for radiation pneumonitis and will treat as such  CARDIOVASCULAR -Continue to monitor hemodynamic status   RENAL -Monitor electrolytes -ICU to try protocol  replacement  GASTROINTESTINAL SUP - PPI  HEMATOLOGIC History of breast cancer Stage III left upper lobe lung cancer -Status post chemoradiation 04/10/2015. The patient also received chest wall radiation on the left side for breast cancer in 2015.  -Hematology/oncology following -Continue with monitoring of CBC   INFECTIOUS Bilateral pneumonia -Continue with current antibiotics, for a total of 10 days -BAL with normal flora   ENDOCRINE -ssi  NEUROLOGIC RASS goal: 0 ---------------------------------------   ----------------------------------------   Name: Brittney Lam MRN: [id number] DOB: December 08, 1960    ADMISSION DATE:  07/18/2015    CHIEF COMPLAINT. Dyspnea   Subjective Patient has no particular complaints today, she is awake, alert and conversational. She does not appear to be particularly dyspneic, even though she is requiring a high amount of oxygen. She appears depressed  Review of Systems:  The patient denies chest pain, PND, cough, expectoration. The remainder of the review systems was reviewed with this patient and found to be negative.    VITAL SIGNS: Temp:  [97.5 F (36.4 C)-99 F (37.2 C)] 97.5 F (36.4 C) (11/30 1116) Pulse Rate:  [65-130] 89 (11/30 1200) Resp:  [19-30] 25 (11/30 1200) BP: (105-147)/(67-89) 105/72 mmHg (11/30 1200) SpO2:  [82 %-95 %] 94 % (11/30 1200) FiO2 (%):  [70 %-90 %] 90 % (11/30 0611) HEMODYNAMICS:   VENTILATOR SETTINGS: Vent Mode:  [-]  FiO2 (%):  [70 %-90 %] 90 % INTAKE / OUTPUT:  Intake/Output Summary (Last 24 hours) at 07/31/15 1310 Last data filed at 07/31/15 1117  Gross per 24 hour  Intake      0 ml  Output   1650 ml  Net  -1650 ml    PHYSICAL EXAMINATION: Physical Examination:   VS: BP 105/72 mmHg  Pulse 89  Temp(Src) 97.5 F (36.4  C) (Axillary)  Resp 25  Ht '5\' 2"'$  (1.575 m)  Wt 65.318 kg (144 lb)  BMI 26.33 kg/m2  SpO2 94%  General Appearance: No distress  Neuro:without focal findings,  mental status normal. HEENT: PERRLA, EOM intact. Pulmonadecreased breath sounds bilaterally.rdiovascularNormal S1,S2.  No m/r/g.   Abdomen: Benign, Soft, non-tender. Renal:  No costovertebral tenderness  GU:  Not performed at this time. Endocrine: No evident thyromegaly. Skin:   warm, no rashes, no ecchymosis  Extremities: normal, no cyanosis, clubbing.   LABS:   LABORATORY PANEL:   CBC  Recent Labs Lab 07/31/15 0508  WBC 26.1*  HGB 12.3  HCT 37.6  PLT 316    Chemistries   Recent Labs Lab 07/31/15 0508  NA 138  K 3.4*  CL 99*  CO2 30  GLUCOSE 95  BUN 25*  CREATININE 0.93  CALCIUM 10.0  AST 15  ALT 17  ALKPHOS 114  BILITOT 0.2*     Recent Labs Lab 07/30/15 0726 07/30/15 1110 07/30/15 1649 07/30/15 1937 07/31/15 0759 07/31/15 1139  GLUCAP 120* 151* 166* 137* 100* 171*   No results for input(s): PHART, PCO2ART, PO2ART in the last 168 hours.  Recent Labs Lab 07/31/15 0508  AST 15  ALT 17  ALKPHOS 114  BILITOT 0.2*  ALBUMIN 2.9*    Cardiac Enzymes No results for input(s): TROPONINI in the last 168 hours.  RADIOLOGY:  Dg Chest 1 View  07/31/2015  CLINICAL DATA:  55 year old female with shortness of Breath. Previous right upper lobectomy for lung cancer in 2014. Metastatic disease, left lung mass, mediastinal lymphadenopathy, and pleural based lesion. Initial encounter. EXAM: CHEST 1 VIEW COMPARISON:  07/30/2015 and earlier. FINDINGS: Portable AP upright view at 0549 hours. Since 07/22/2015 confluent left upper lobe airspace disease has slightly regressed. Stable lung volumes. Stable cardiac size and mediastinal contours. Left chest porta cath remains in place and is accessed. Right upper lobe airspace disease has significantly regressed since 07/18/2015. No pneumothorax, pleural effusion or pulmonary edema. IMPRESSION: 1. Slightly improved dense airspace disease in the left upper lobe, and significantly improved right upper lobe, since 07/18/2015.  2. No new cardiopulmonary abnormality identified. Electronically Signed   By: Genevie Ann M.D.   On: 07/31/2015 07:18   Dg Chest 1 View  07/30/2015  CLINICAL DATA:  Chest pain. EXAM: CHEST 1 VIEW COMPARISON:  07/26/2015. FINDINGS: Port-A-Cath noted with tip in stable position. Heart size stable. Persistent bilateral pulmonary infiltrates, particular prominent left upper lobe, no interim change. No pleural effusion or pneumothorax. No acute bony abnormality. IMPRESSION: 1. Port-A-Cath in stable position. 2. Persistent bilateral pulmonary infiltrates, particular prominent left upper lobe. No interim change. Electronically Signed   By: Marcello Moores  Register   On: 07/30/2015 07:46       --Marda Stalker, MD.  [facility]  Patricia Pesa, M.D.  Vilinda Boehringer, M.D.  Merton Border, M.D  [place].  I have personally obtained a history, examined the patient, evaluated laboratory and imaging results, formulated the assessment and plan and placed orders. The case was discussed with the critical care RN, nurse manager, nutrition, ICU pharmacist. The Patient requires high complexity decision making for assessment and support, frequent evaluation and titration of therapies, application of advanced monitoring technologies and extensive interpretation of multiple databases. The patient has critical illness that could lead imminently to failure of 1 or more organ systems and requires the highest level of physician preparedness to intervene.  Critical Care Time devoted to patient care  services described in this note is 35 minutes and is exclusive of time spent in procedures.

## 2015-07-31 NOTE — Progress Notes (Signed)
Brittney Lam @ [facility] [phone]  Fax:([phone]) Brittney Lam: 24-Feb-1961  MR#: [id number]  SEG#:[id number]  Patient Care Team: [name], MD as PCP - General (Family Medicine) [name], MD (General Surgery) [name], RN as [place] Management [name], MD as Referring Physician (Family Medicine)  CHIEF COMPLAINT:  No chief complaint on file.  Oncology History   43. 54 year old female status post recent left breast cancer in 2005  2. Right breast cancer, locally advanced stage IIIa (pyT3, N1, M0), invasive mammary carcinoma with mucin production. Status post right modified radical mastectomy for 8 cm lesion with one of 4 sentinel lymph nodes positive for metastatic disease. Tumor is ER positive PR negative HER-2/neu not overexpressed. 3. Completed neoadjuvant chemotherapy with chest wall radiation therapy,  Finished on October 21, 2013. 4. Started letrazole 2.5 mg by mouth daily from October 24, 2013. 5. Carcinoma of lung.  Right upper lobe, status post right upper lobectomy, [id number]. 6.  Left upper lobe mass with mediastinal lymph node biopsies positive for poorly differentiated adenocarcinoma T1 N1 M0 tumor III Starting chemoradiation therapy in July of 2016 (second primary) Patient had EUS in June of 2016.  Bilateral hilar adenopathy which has been biopsied was positive for metastatic adenocarcinoma consistent with lung primary Starting chemoradiation therapy in July of 2016 7.  Has finished 6 cycles of chemotherapy with carboplatinum and Taxol concurrent with radiation therapy on April 10, 2015       INTERVAL HISTORY:  53 year old African-American lady with a history of multiple malignancies admitted in hospital with acute respiratory failure.  Bilateral upper lobe infiltrates and suspected radiation pneumonitis versus progressing cancer versus infection After 10 days of hospitalization patient  has been stabilized but no significant improvement in pulmonary function. Patient is somewhat depressed  Patient remains very apprehensive.  Left upper extremity swelling is improved. Review of systems  general status: Patient is feeling weak and tired.  No change in a performance status.  No chills.  No fever. HEENT: Difficulty swallowing. Lungs: Increasing shortness of breath.  Cough.  No hemoptysis.  Oxygen saturation dropped to 81%. Cardiac: No chest pain or paroxysmal nocturnal dyspnea GI: No nausea no vomiting no diarrhea no abdominal pain Skin: No rash Lower extremity no swelling Left upper extremity is swelling.  Has improved Neurological system: No tingling.  No numbness.  No other focal signs Musculoskeletal system no bony pains    PAST MEDICAL HISTORY: Past Medical History  Diagnosis Date  . Unspecified essential hypertension   . Kidney failure   . Personal history of tobacco use, presenting hazards to health   . Cataract   . Personal history of malignant neoplasm of breast   . Breast screening, unspecified   . Special screening for malignant neoplasms, colon   . Diabetes mellitus without complication ([place])   . MDRO (multiple drug resistant organisms) resistance   . H. pylori duodenitis   . Complication of anesthesia     BP DROPPED DURING LOBECTOMY IN 2014  . Cancer [facility]) 2001    left breast cancer s/p L/SN/R in 2001  . Cancer [facility]) 2014    right breast invasive CA, Er pos,Pr neg, Her 2 neg.  . Lung cancer, upper lobe ([place]) 2014    right upper lobe  . Breast cancer ([place])     PAST SURGICAL HISTORY: Past Surgical History  Procedure Laterality Date  . Intercostal nerve block  2005  . Cataract extraction  w/ intraocular lens implant  2005  . Carpal tunnel release Right 2011  . Insertion central venous access device w/ subcutaneous port  12-07-12  . Breast lumpectomy Left 2001  . Breast surgery Right 2014    total mastectomy  . Right lung upper lobectomy   03/2013  . Lobectomy Right   . Cataract extraction    . Mastectomy Right   . Colonoscopy    . Endobronchial ultrasound N/A 02/05/2015    Procedure: ENDOBRONCHIAL ULTRASOUND;  Surgeon: Flora Lipps, MD;  Location: [facility] ORS;  Service: Cardiopulmonary;  Laterality: N/A;  . Flexible bronchoscopy N/A 07/13/2015    Procedure: FLEXIBLE BRONCHOSCOPY;  Surgeon: Allyne Gee, MD;  Location: [facility] ORS;  Service: Pulmonary;  Laterality: N/A;    FAMILY HISTORY Family History  Problem Relation Age of Onset  . Diabetes Other   . Hyperlipidemia Other   . Hypertension Other         ADVANCED DIRECTIVES: Patient does not have any advanced healthcare directive. Information has been given.   HEALTH MAINTENANCE: Social History  Substance Use Topics  . Smoking status: Former Smoker -- 0.50 packs/day for 30 years    Types: Cigarettes    Quit date: 02/03/2013  . Smokeless tobacco: Never Used     Comment: using 79m nicorette gum- 1/2 piece, 10 times per day  . Alcohol Use: No      Allergies  Allergen Reactions  . Metformin Diarrhea and Nausea Only  . Other Rash    Sage Wipes    Current Facility-Administered Medications  Medication Dose Route Frequency Provider Last Rate Last Dose  . acetaminophen (TYLENOL) tablet 650 mg  650 mg Oral Q4H PRN LEvlyn Kanner NP   650 mg at 07/30/15 1456  . butamben-tetracaine-benzocaine (CETACAINE) spray 1 spray  1 spray Topical Once SAllyne Gee MD      . Calcium Citrate-Vitamin D 315-250 MG-UNIT TABS 1 tablet  1 tablet Oral BID SFritzi Mandes MD   1 tablet at 07/31/15 1055  . enoxaparin (LOVENOX) injection 100 mg  1.5 mg/kg Subcutaneous Q24H GCammie Sickle MD   100 mg at 07/30/15 2025  . famotidine (PEPCID) tablet 20 mg  20 mg Oral BID PLaverle Hobby MD   20 mg at 07/31/15 1055  . feeding supplement (BOOST / RESOURCE BREEZE) liquid 1 Container  1 Container Oral TID WC SFritzi Mandes MD      . glipiZIDE (GLUCOTROL XL) 24 hr tablet 5 mg  5 mg Oral  Q breakfast LEvlyn Kanner NP   5 mg at 07/31/15 0807  . guaiFENesin-dextromethorphan (ROBITUSSIN DM) 100-10 MG/5ML syrup 10 mL  10 mL Oral Q4H PRN LEvlyn Kanner NP   10 mL at 07/31/15 1226  . hydrochlorothiazide (HYDRODIURIL) tablet 12.5 mg  12.5 mg Oral Daily LEvlyn Kanner NP   12.5 mg at 07/31/15 1055  . insulin aspart (novoLOG) injection 0-15 Units  0-15 Units Subcutaneous TID WC GCammie Sickle MD   3 Units at 07/31/15 1200  . insulin aspart (novoLOG) injection 0-5 Units  0-5 Units Subcutaneous QHS GCammie Sickle MD   2 Units at 07/28/15 2208  . letrozole ([facility] tablet 2.5 mg  2.5 mg Oral Daily LEvlyn Kanner NP   2.5 mg at 07/31/15 1055  . levalbuterol (XOPENEX) nebulizer solution 1.25 mg  1.25 mg Nebulization Q4H PRN Vishal Mungal, MD      . lidocaine (XYLOCAINE) 2 % jelly 1 application  1 application Topical Once Saadat  A Humphrey Rolls, MD      . nystatin (MYCOSTATIN) 100000 UNIT/ML suspension 500,000 Units  5 mL Mouth/Throat QID Lequita Asal, MD   500,000 Units at 07/31/15 1055  . ondansetron (ZOFRAN) injection 4 mg  4 mg Intravenous Q6H PRN Cammie Sickle, MD   4 mg at 07/20/15 0121  . PARoxetine (PAXIL) tablet 10 mg  10 mg Oral QHS Evlyn Kanner, NP   10 mg at 07/30/15 2130  . phenylephrine (NEO-SYNEPHRINE) 0.25 % nasal spray 1 spray  1 spray Each Nare Q6H PRN Allyne Gee, MD      . potassium chloride SA (K-DUR,KLOR-CON) CR tablet 20 mEq  20 mEq Oral Daily Evlyn Kanner, NP   20 mEq at 07/31/15 1055  . predniSONE (DELTASONE) tablet 60 mg  60 mg Oral Q breakfast Laverle Hobby, MD   60 mg at 07/31/15 1055  . senna-docusate (Senokot-S) tablet 1 tablet  1 tablet Oral QHS PRN Evlyn Kanner, NP      . simvastatin (ZOCOR) tablet 10 mg  10 mg Oral Daily Evlyn Kanner, NP   10 mg at 07/30/15 2025  . sodium chloride 0.9 % injection 10 mL  10 mL Intravenous PRN Cammie Sickle, MD   10 mL at 07/22/15 2010   Facility-Administered Medications  Ordered in Other Encounters  Medication Dose Route Frequency Provider Last Rate Last Dose  . sodium chloride 0.9 % injection 10 mL  10 mL Intracatheter PRN Forest Gleason, MD   10 mL at 02/04/15 1027    OBJECTIVE:  Filed Vitals:   07/31/15 1116 07/31/15 1200  BP:  105/72  Pulse:  89  Temp: 97.5 F (36.4 C)   Resp:  25     Body mass index is 26.33 kg/(m^2).    ECOG FS:0 - Asymptomatic  PHYSICAL EXAM: GENERAL:  Performance status is declining Patient is in mild distress MENTAL STATUS:  Alert and oriented to person, place and time. HEAD:   Normocephalic, atraumatic, face symmetric, no Cushingoid features. EYES:    Pupils equal round and reactive to light and accomodation.  No conjunctivitis or scleral icterus. ENT:  Oropharynx clear without lesion.  Tongue normal. Mucous membranes moist.  RESPIRATORY wheezing and rhonchi on both sides CARDIOVASCULAR:  Regular rate and rhythm without murmur, rub or gallop. BREAST:  Right breast : Status post mastectomy.  No evidence of recurrent disease skin changes or nipple discharge.  Left breast without masses, skin changes or nipple discharge. ABDOMEN:  Soft, non-tender, with active bowel sounds, and no hepatosplenomegaly.  No masses. BACK:  No CVA tenderness.  No tenderness on percussion of the back or rib cage. SKIN:  No rashes, ulcers or lesions. EXTREMITIES: No edema, no skin discoloration or tenderness.  No palpable cords. Swelling of the left upper extremity LYMPH NODES: No palpable cervical, supraclavicular, axillary or inguinal adenopathy  NEUROLOGICAL: Unremarkable. PSYCH:  Appropriate.   LAB RESULTS:  Admission on 07/18/2015  No results displayed because visit has over 200 results.      Lab Results  Component Value Date   LABCA2 20.3 06/11/2014     STUDIES: Dg Chest 1 View  07/31/2015  CLINICAL DATA:  54 year old female with shortness of Breath. Previous right upper lobectomy for lung cancer in 2014. Metastatic disease,  left lung mass, mediastinal lymphadenopathy, and pleural based lesion. Initial encounter. EXAM: CHEST 1 VIEW COMPARISON:  07/30/2015 and earlier. FINDINGS: Portable AP upright view at 0549 hours. Since 07/22/2015 confluent left upper lobe  airspace disease has slightly regressed. Stable lung volumes. Stable cardiac size and mediastinal contours. Left chest porta cath remains in place and is accessed. Right upper lobe airspace disease has significantly regressed since 07/18/2015. No pneumothorax, pleural effusion or pulmonary edema. IMPRESSION: 1. Slightly improved dense airspace disease in the left upper lobe, and significantly improved right upper lobe, since 07/18/2015. 2. No new cardiopulmonary abnormality identified. Electronically Signed   By: Genevie Ann M.D.   On: 07/31/2015 07:18   Dg Chest 1 View  07/30/2015  CLINICAL DATA:  Chest pain. EXAM: CHEST 1 VIEW COMPARISON:  07/26/2015. FINDINGS: Port-A-Cath noted with tip in stable position. Heart size stable. Persistent bilateral pulmonary infiltrates, particular prominent left upper lobe, no interim change. No pleural effusion or pneumothorax. No acute bony abnormality. IMPRESSION: 1. Port-A-Cath in stable position. 2. Persistent bilateral pulmonary infiltrates, particular prominent left upper lobe. No interim change. Electronically Signed   By: Marcello Moores  Register   On: 07/30/2015 07:46   Dg Chest 1 View  07/22/2015  CLINICAL DATA:  Recurrent left upper lobe lung cancer for which the patient is currently being treated, presenting with acutely worsening shortness of breath, cough and chest congestion. EXAM: Portable CHEST 1 VIEW COMPARISON:  CT chest 07/18/2015 and earlier.  PET-CT 06/14/2015. FINDINGS: Suboptimal inspiration. Cardiac silhouette upper normal in size. Dense airspace consolidation in the left upper lobe with air bronchograms, unchanged since the CT 4 days ago. Improved aeration in the right upper lobe, though patchy airspace opacities persist.  Consolidation medially in the left lower lobe, unchanged. Small bilateral pleural effusions, unchanged. No new pulmonary parenchymal abnormalities. Left subclavian Port-A-Cath tip projects at or near the cavoatrial junction. IMPRESSION: Since the CT chest 4 days ago: 1. Improved aeration in the right upper lobe, though patchy airspace opacities persist indicating residual pneumonia. 2. 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 3. No change in the consolidation in the medial right lower lobe, likely post radiation pneumonitis. 4. No new abnormalities. Electronically Signed   By: Evangeline Dakin M.D.   On: 07/22/2015 19:33   Ct Angio Chest Pe W/cm &/or Wo Cm  07/18/2015  CLINICAL DATA:  Short of breath for several weeks EXAM: CT ANGIOGRAPHY CHEST WITH CONTRAST TECHNIQUE: Multidetector CT imaging of the chest was performed using the standard protocol during bolus administration of intravenous contrast. Multiplanar CT image reconstructions and MIPs were obtained to evaluate the vascular anatomy. CONTRAST:  136m OMNIPAQUE IOHEXOL 350 MG/ML SOLN COMPARISON:  PET-CT 06/14/2015 FINDINGS: There are no filling defects in the pulmonary arterial tree to suggest acute pulmonary thromboembolism. Mediastinal adenopathy is not significantly changed since the PET-CT from 1 month ago. Left subclavian Port-A-Cath is stable. Consolidation within the apical left upper lobe and superior segment of the right lower lobe has markedly worsened. Very small bilateral pleural effusions have developed. No pneumothorax. The 1.4 cm left upper lobe lung mass on image 43 of series 8 is stable. Postoperative changes from right mastectomy are again noted. Bilateral rib fractures again noted. Right rib fractures are noticeably displaced. There are sclerotic areas in upper left antral lateral ribs without fractures which may represent rib lesions.  There are also fractures and left mid to lower anterior ribs with some evidence of healing. These are all not significantly changed. Review of the MIP images confirms the above findings. IMPRESSION: No evidence of acute pulmonary thromboembolism Consolidation in the left upper lobe and superior segment of  the right lower lobe has worsened. This may represent pneumonia, pneumonitis, or infiltration with tumor. New very small pleural effusions. Stable mediastinal adenopathy. Bilateral of rib fractures are again noted and are not significantly changed. They may represent pathologic fractures. Electronically Signed   By: Marybelle Killings M.D.   On: 07/18/2015 11:02   US Venous Img Upper Uni Left  07/29/2015  CLINICAL DATA:  Left upper extremity swelling x1 month. Diabetes. Left subclavian surgically placed port catheter. EXAM: LEFT UPPER EXTREMITY VENOUS DOPPLER ULTRASOUND TECHNIQUE: Gray-scale sonography with graded compression, as well as color Doppler and duplex ultrasound were performed to evaluate the upper extremity deep venous system from the level of the subclavian vein and including the jugular, axillary, basilic and upper cephalic vein. Spectral Doppler was utilized to evaluate flow at rest and with distal augmentation maneuvers. COMPARISON:  CT 07/18/2015 and previous FINDINGS: Thrombus within deep veins: Noncompressible thrombus in the axillary vein over short segment. Brachial, radial, ulnar, basilic, and cephalic veins remain patent and compressible, with somewhat monophasic waveforms probably related to the central obstruction. There is a monophasic antegrade waveform in the visualized subclavian vein, without evidence of transmitted right atrial pulsations. Other findings: Images of contralateral subclavian vein are unremarkable. IMPRESSION: 1. Isolated occlusive left axillary  DVT. Electronically Signed   By: Lucrezia Europe M.D.   On: 07/02/2015 16:44   Dg Chest Port 1 View  07/26/2015  CLINICAL DATA:   Respiratory failure. History of lung and breast cancer. EXAM: PORTABLE CHEST 1 VIEW COMPARISON:  07/22/2015 FINDINGS: Left-sided injectable central venous catheter is stable. Cardiomediastinal silhouette is normal. Mediastinal contours appear intact. There is no evidence of pneumothorax. Dense left upper lobe airspace consolidation/pulmonary mass is stable in appearance. There also remains a patchy airspace consolidation in the right upper lobe. No definite evidence of pleural effusion. Osseous structures are without acute abnormality. There has been a prior right mastectomy. IMPRESSION: No significant change in the appearance of the lungs with dense left upper lobe airspace consolidation and/or pulmonary mass, and patchy airspace consolidation in the right upper lobe. Electronically Signed   By: Fidela Salisbury M.D.   On: 07/26/2015 09:31    ASSESSMENT: 1.  Patient has a history of lung cancer bilateral recently had stage III disease status post radiation chemotherapy now admitted with acute on chronic respiratory failure or suspected secondary to radiation pneumonitis On steroid and on   Empirical  antibiotics. On high flow oxygen Negative bronchus for any malignancy and cultures are negative Probably would need transthoracic biopsy or percutaneous CT-guided biopsy to rule out malignancy  Discussed situation with pulmonologist and it appears that patient might be on were just getting intubated because of requirement of   100% oxygen.  Left upper extremity swelling is improved   Chest x-ray shows essentially no change as been reviewed independently  I discussed with the patient.  She is  in agreement that she does not want any CPR or intubation but would like to continue supportive measures with steroid IV antibiotics.  Pressor support.  Will put patient on limited code.  I discussed situation with the intensivist . Will start patient on antidepressant medication Try to get patient up in  the chair If feasible patient can be transferred to regular floor and moderate occupational and physical therapy can be started  Leukocytosis is most likely due to steroid   Malignant neoplasm of female breast   Staging form: Breast, AJCC 7th Edition     Clinical: Stage IIIA (T3,  N1, M0) - Signed by Forest Gleason, MD on 01/26/2015 Malignant neoplasm of upper lobe, bronchus or lung   Staging form: Lung, AJCC 7th Edition     Clinical: Stage IA (T1b, N0, M0) - Signed by Forest Gleason, MD on 01/26/2015   Forest Gleason, MD   07/31/2015 12:52 PM

## 2015-07-31 NOTE — Progress Notes (Signed)
[place] [facility] [place] NAME: Brittney Lam    MR#:  [id number]  DATE OF BIRTH:  [birth year]  SUBJECTIVE:   Feeling better today. On high flow nasal cannula 95%. Out in the chair Sats 91-93%  REVIEW OF SYSTEMS:   Review of Systems  Constitutional: Negative for fever, chills and weight loss.  HENT: Negative for ear discharge, ear pain and nosebleeds.   Eyes: Negative for blurred vision, pain and discharge.  Respiratory: Positive for cough, sputum production and shortness of breath. Negative for wheezing and stridor.   Cardiovascular: Negative for chest pain, palpitations, orthopnea and PND.  Gastrointestinal: Negative for nausea, vomiting, abdominal pain and diarrhea.  Genitourinary: Negative for urgency and frequency.  Musculoskeletal: Negative for back pain and joint pain.  Neurological: Positive for weakness. Negative for sensory change, speech change and focal weakness.  Psychiatric/Behavioral: Negative for depression and hallucinations. The patient is not nervous/anxious.   All other systems reviewed and are negative.  Tolerating Diet:yes Tolerating PT: not seen yet since on HFNC  DRUG ALLERGIES:   Allergies  Allergen Reactions  . Metformin Diarrhea and Nausea Only  . Other Rash    Sage Wipes    VITALS:  Blood pressure 119/85, pulse 104, temperature 97.5 F (36.4 C), temperature source Axillary, resp. rate 27, height '5\' 2"'$  (1.575 m), weight 144 lb (65.318 kg), SpO2 94 %.  PHYSICAL EXAMINATION:   Physical Exam  GENERAL:  54 y.o.-year-old patient lying in the bed with mild  acute distress.  EYES: Pupils equal, round, reactive to light and accommodation. No scleral icterus. Extraocular muscles intact.  HEENT: Head atraumatic, normocephalic. Oropharynx and nasopharynx clear.  NECK:  Supple, no jugular venous distention. No thyroid enlargement, no tenderness.  LUNGS: distant breath sounds bilaterally, rales, rhonchi. No  use of accessory muscles of respiration. Scattered wheezing CARDIOVASCULAR: S1, S2 normal. No murmurs, rubs, or gallops. Mild tachycardia ABDOMEN: Soft, nontender, nondistended. Bowel sounds present. No organomegaly or mass.  EXTREMITIES: No cyanosis, clubbing or edema b/l.    NEUROLOGIC: Cranial nerves II through XII are intact. No focal Motor or sensory deficits b/l.   PSYCHIATRIC: The patient is alert and oriented x 3.  SKIN: No obvious rash, lesion, or ulcer.    LABORATORY PANEL:   CBC  Recent Labs Lab 07/31/15 0508  WBC 26.1*  HGB 12.3  HCT 37.6  PLT 316    Chemistries   Recent Labs Lab 07/31/15 0508  NA 138  K 3.4*  CL 99*  CO2 30  GLUCOSE 95  BUN 25*  CREATININE 0.93  CALCIUM 10.0  AST 15  ALT 17  ALKPHOS 114  BILITOT 0.2*    Cardiac Enzymes No results for input(s): TROPONINI in the last 168 hours.  RADIOLOGY:  Dg Chest 1 View  07/31/2015  CLINICAL DATA:  54 year old female with shortness of Breath. Previous right upper lobectomy for lung cancer in 2014. Metastatic disease, left lung mass, mediastinal lymphadenopathy, and pleural based lesion. Initial encounter. EXAM: CHEST 1 VIEW COMPARISON:  07/30/2015 and earlier. FINDINGS: Portable AP upright view at 0549 hours. Since 07/22/2015 confluent left upper lobe airspace disease has slightly regressed. Stable lung volumes. Stable cardiac size and mediastinal contours. Left chest porta cath remains in place and is accessed. Right upper lobe airspace disease has significantly regressed since 07/18/2015. No pneumothorax, pleural effusion or pulmonary edema. IMPRESSION: 1. Slightly improved dense airspace disease in the left upper lobe, and significantly improved right upper lobe, since 07/18/2015.  2. No new cardiopulmonary abnormality identified. Electronically Signed   By: Brittney Lam M.D.   On: 07/31/2015 07:18   Dg Chest 1 View  07/30/2015  CLINICAL DATA:  Chest pain. EXAM: CHEST 1 VIEW COMPARISON:  07/26/2015.  FINDINGS: Port-A-Cath noted with tip in stable position. Heart size stable. Persistent bilateral pulmonary infiltrates, particular prominent left upper lobe, no interim change. No pleural effusion or pneumothorax. No acute bony abnormality. IMPRESSION: 1. Port-A-Cath in stable position. 2. Persistent bilateral pulmonary infiltrates, particular prominent left upper lobe. No interim change. Electronically Signed   By: Brittney Lam  Register   On: 07/30/2015 07:46     ASSESSMENT AND PLAN:  Brittney Lam is a 54 y.o. 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  * Acute respiratory failure with hypoxia Left upper lobe lung cancer, radiation pneumonitis and bilateral pneumonia BAL is negative 07/07/2015) -completed broad-spectrum antibiotic coverage with IV vanc, zithromax  and zosyn --completed abxs -IV steroid (high doses for possible radiation pneumonitis) -HFNC and Bipap prn, does not tolerate nebs rx -now DNR, prognosis long term poor  * Diabetes  On glipizide and Insulin sliding coverage.  * Sinus tachycardia  Likely due to Hypoxia.  * Lung cancer Last chemo and radiation several weeks ago  * Hyperlipidemia  Zocor.  Case discussed with Care Management/Social Worker. Management plans discussed with the patient, family and they are in agreement.  CODE STATUS: DNR  DVT Prophylaxis: lovenox  TOTAL  TIME TAKING CARE OF THIS PATIENT: 30 minutes.  >50% time spent on counselling and coordination of care  Brittney Lam M.D on 07/31/2015 at 5:40 PM  Between 7am to 6pm - Pager - [phone]  After 6pm go to [email or web address] - password [email or web address] [facility]  [facility]  Office  [phone]  CC: Primary care physician; Brittney Pink, MD

## 2015-07-31 NOTE — Progress Notes (Signed)
Spoke with pt about wearing Bipap and pt does not want to wear that. Told her that her SPO2 was low and she said she just can't wear that Bipap. Turned HFNC up to 80%. SPO2 89%.

## 2015-07-31 NOTE — Progress Notes (Signed)
Patient oob to chair with 1 person assistance. Her O2 sat maintained between 87-88% with ambulating.

## 2015-08-01 DIAGNOSIS — C3431 Malignant neoplasm of lower lobe, right bronchus or lung: Secondary | ICD-10-CM | POA: Diagnosis not present

## 2015-08-01 DIAGNOSIS — J9601 Acute respiratory failure with hypoxia: Secondary | ICD-10-CM | POA: Diagnosis not present

## 2015-08-01 DIAGNOSIS — C3412 Malignant neoplasm of upper lobe, left bronchus or lung: Secondary | ICD-10-CM | POA: Diagnosis not present

## 2015-08-01 DIAGNOSIS — J9600 Acute respiratory failure, unspecified whether with hypoxia or hypercapnia: Secondary | ICD-10-CM | POA: Diagnosis not present

## 2015-08-01 DIAGNOSIS — C3490 Malignant neoplasm of unspecified part of unspecified bronchus or lung: Secondary | ICD-10-CM | POA: Diagnosis not present

## 2015-08-01 DIAGNOSIS — J189 Pneumonia, unspecified organism: Secondary | ICD-10-CM | POA: Diagnosis not present

## 2015-08-01 DIAGNOSIS — J701 Chronic and other pulmonary manifestations due to radiation: Secondary | ICD-10-CM | POA: Diagnosis not present

## 2015-08-01 DIAGNOSIS — J700 Acute pulmonary manifestations due to radiation: Secondary | ICD-10-CM | POA: Diagnosis not present

## 2015-08-01 LAB — GLUCOSE, CAPILLARY
GLUCOSE-CAPILLARY: 106 mg/dL — AB (ref 65–99)
GLUCOSE-CAPILLARY: 231 mg/dL — AB (ref 65–99)
GLUCOSE-CAPILLARY: 175 mg/dL — AB (ref 65–99)
Glucose-Capillary: 207 mg/dL — ABNORMAL HIGH (ref 65–99)

## 2015-08-01 NOTE — Progress Notes (Signed)
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 -The patient has not responded to empiric steroids, she is currently being maintained on 60 mg of prednisone daily. She has completed a course of broad-spectrum antibiotics without any significant improvement. This makes it less likely that the patient's respiratory failure is due to pneumonia or reversible radiation pneumonitis. - tolerating HFNC this morning, the patient has been having trouble tolerating BiPAP. Therefore, we'll continue on high flow nasal cannula. The patient has been requiring increasing amounts of high flow nasal cannula. -Lung cancer/stage III status post chemoradiation, finished 04/10/2015.  P:  -Given her continued lack of response to empiric therapy, now requiring nearly maximal amounts of high flow oxygen. May need to consider hospice or palliative care. -Continue empiric steroids and high flow nasal cannula for today, we'll likely transfer to the floor on Monday with nasal cannula oxygen as 6-10 L and unmonitored, sats. Plan at that time will be transferred to inpatient hospice.  - stopped scheduled nebs due to rebound anxiety and bronchospams, xopenox PRN -Status post bronchoscopy with BAL on 11/23 - cultures negative to date -Patient does have a mild anxiety component with her respiratory distress, 1 mg morphine IV every 6 hours for shortness of breath and chest pain 3 days   CARDIOVASCULAR -Continue to monitor hemodynamic status   RENAL -Monitor electrolytes -ICU to try protocol  replacement  GASTROINTESTINAL SUP - PPI  HEMATOLOGIC History of breast cancer Stage III left upper lobe lung cancer -Status post chemoradiation 04/10/2015. The patient also received chest wall radiation on the left side for breast cancer in 2015.  -Hematology/oncology following -Continue with monitoring of CBC   INFECTIOUS Bilateral pneumonia -Continue with current antibiotics, for a total of 10 days -BAL with normal flora   ENDOCRINE -ssi  NEUROLOGIC RASS goal: 0 ---------------------------------------   ----------------------------------------   Name: Brittney Lam MRN: [id number] DOB: Jul 01, 1961    ADMISSION DATE:  07/20/2015    CHIEF COMPLAINT. Dyspnea   Subjective Patient has no particular complaints today, she is awake, alert and conversational. She does not appear to be particularly dyspneic, even though she is requiring a high amount of oxygen. Her spirits appear improved today.  Review of Systems:  The patient denies chest pain, PND, cough, expectoration. The remainder of the review systems was reviewed with this patient and found to be negative.    VITAL SIGNS: Temp:  [97.8 F (36.6 C)-98.7 F (37.1 C)] 98.7 F (37.1 C) (12/01 0800) Pulse Rate:  [71-123] 98 (12/01 1100) Resp:  [18-33] 24 (12/01 1100) BP: (90-129)/(58-91) 121/84 mmHg (12/01 1100) SpO2:  [88 %-98 %] 89 % (12/01 1149) FiO2 (%):  [95 %-96 %] 95 % (12/01 1149) HEMODYNAMICS:   VENTILATOR SETTINGS: Vent Mode:  [-]  FiO2 (%):  [95 %-96 %] 95 % INTAKE / OUTPUT:  Intake/Output Summary (Last 24 hours) at 08/01/15 1308 Last data filed at 08/01/15 0631  Gross per 24 hour  Intake      0 ml  Output    900 ml  Net   -900 ml    PHYSICAL EXAMINATION: Physical Examination:   VS: BP 121/84 mmHg  Pulse 98  Temp(Src) 98.7 F (37.1 C) (Oral)  Resp 24  Ht '5\' 2"'$  (1.575 m)  Wt 65.318 kg (144 lb)  BMI 26.33 kg/m2  SpO2 89%  General Appearance: No distress  Neuro:without focal  findings, mental status normal. HEENT: PERRLA, EOM intact. Pulmonadecreased breath sounds bilaterally.rdiovascularNormal S1,S2.  No m/r/g.   Abdomen: Benign, Soft, non-tender. Renal:  No costovertebral tenderness  GU:  Not performed at this time. Endocrine: No evident thyromegaly. Skin:   warm, no rashes, no ecchymosis  Extremities: normal, no cyanosis, clubbing.   LABS:   LABORATORY PANEL:   CBC  Recent Labs Lab 07/31/15 0508  WBC 26.1*  HGB 12.3  HCT 37.6  PLT 316    Chemistries   Recent Labs Lab 07/31/15 0508  NA 138  K 3.4*  CL 99*  CO2 30  GLUCOSE 95  BUN 25*  CREATININE 0.93  CALCIUM 10.0  AST 15  ALT 17  ALKPHOS 114  BILITOT 0.2*     Recent Labs Lab 07/31/15 0759 07/31/15 1139 07/31/15 1706 07/31/15 2129 08/01/15 0731 08/01/15 1123  GLUCAP 100* 171* 213* 175* 106* 207*   No results for input(s): PHART, PCO2ART, PO2ART in the last 168 hours.  Recent Labs Lab 07/31/15 0508  AST 15  ALT 17  ALKPHOS 114  BILITOT 0.2*  ALBUMIN 2.9*    Cardiac Enzymes No results for input(s): TROPONINI in the last 168 hours.  RADIOLOGY:  Dg Chest 1 View  07/31/2015  CLINICAL DATA:  54 year old female with shortness of Breath. Previous right upper lobectomy for lung cancer in 2014. Metastatic disease, left lung mass, mediastinal lymphadenopathy, and pleural based lesion. Initial encounter. EXAM: CHEST 1 VIEW COMPARISON:  07/30/2015 and earlier. FINDINGS: Portable AP upright view at 0549 hours. Since 07/22/2015 confluent left upper lobe airspace disease has slightly regressed. Stable lung volumes. Stable cardiac size and mediastinal contours. Left chest porta cath remains in place and is accessed. Right upper lobe airspace disease has significantly regressed since 07/18/2015. No pneumothorax, pleural effusion or pulmonary edema. IMPRESSION: 1. Slightly improved dense airspace disease in the left upper lobe, and significantly improved right upper lobe, since  07/18/2015. 2. No new cardiopulmonary abnormality identified. Electronically Signed   By: Genevie Ann M.D.   On: 07/31/2015 07:18       --Marda Stalker, MD.  [facility]  Patricia Pesa, M.D.  Vilinda Boehringer, M.D.  Merton Border, M.D  [place].  I have personally obtained a history, examined the patient, evaluated laboratory and imaging results, formulated the assessment and plan and placed orders. The case was discussed with the critical care RN, nurse manager, nutrition, ICU pharmacist. The Patient requires high complexity decision making for assessment and support, frequent evaluation and titration of therapies, application of advanced monitoring technologies and extensive interpretation of multiple databases. The patient has critical illness that could lead imminently to failure of 1 or more organ systems and requires the highest level of physician preparedness to intervene.  Critical Care Time devoted to patient care services described in this note is 35 minutes and is exclusive of time spent in procedures.

## 2015-08-01 NOTE — Progress Notes (Signed)
Spoke with Dr Ashby Dawes I will sign off for now. Call IM if needed. Thanks.

## 2015-08-01 NOTE — Progress Notes (Signed)
[place] @ [facility] [phone]  Fax:([phone]) [place]: 15-Sep-1960  MR#: [id number]  GGY#:[id number]  Patient Care Team: [name], MD as PCP - General (Family Medicine) [name], MD (General Surgery) [name], RN as [place] Management [name], MD as Referring Physician (Family Medicine)  CHIEF COMPLAINT:  No chief complaint on file.  Oncology History   62. 54 year old female status post recent left breast cancer in 2005  2. Right breast cancer, locally advanced stage IIIa (pyT3, N1, M0), invasive mammary carcinoma with mucin production. Status post right modified radical mastectomy for 8 cm lesion with one of 4 sentinel lymph nodes positive for metastatic disease. Tumor is ER positive PR negative HER-2/neu not overexpressed. 3. Completed neoadjuvant chemotherapy with chest wall radiation therapy,  Finished on October 21, 2013. 4. Started letrazole 2.5 mg by mouth daily from October 24, 2013. 5. Carcinoma of lung.  Right upper lobe, status post right upper lobectomy, [id number]. 6.  Left upper lobe mass with mediastinal lymph node biopsies positive for poorly differentiated adenocarcinoma T1 N1 M0 tumor III Starting chemoradiation therapy in July of 2016 (second primary) Patient had EUS in June of 2016.  Bilateral hilar adenopathy which has been biopsied was positive for metastatic adenocarcinoma consistent with lung primary Starting chemoradiation therapy in July of 2016 7.  Has finished 6 cycles of chemotherapy with carboplatinum and Taxol concurrent with radiation therapy on April 10, 2015       INTERVAL HISTORY:  54 year old African-American lady with a history of multiple malignancies admitted in hospital with acute respiratory failure.  Bilateral upper lobe infiltrates and suspected radiation pneumonitis versus progressing cancer versus infection Patient is feeling somewhat better today.   Less apprehensive.  Review of systems  general status: Patient is feeling weak and tired.  No change in a performance status.  No chills.  No fever. HEENT: Difficulty swallowing. Lungs: Increasing shortness of breath.  Cough.  No hemoptysis.  Oxygen saturation dropped to 81%. Cardiac: No chest pain or paroxysmal nocturnal dyspnea GI: No nausea no vomiting no diarrhea no abdominal pain Skin: No rash Lower extremity no swelling Left upper extremity is swelling.  Has improved Neurological system: No tingling.  No numbness.  No other focal signs Musculoskeletal system no bony pains    PAST MEDICAL HISTORY: Past Medical History  Diagnosis Date  . Unspecified essential hypertension   . Kidney failure   . Personal history of tobacco use, presenting hazards to health   . Cataract   . Personal history of malignant neoplasm of breast   . Breast screening, unspecified   . Special screening for malignant neoplasms, colon   . Diabetes mellitus without complication ([place])   . MDRO (multiple drug resistant organisms) resistance   . H. pylori duodenitis   . Complication of anesthesia     BP DROPPED DURING LOBECTOMY IN 2014  . Cancer [facility]) 2001    left breast cancer s/p L/SN/R in 2001  . Cancer [facility]) 2014    right breast invasive CA, Er pos,Pr neg, Her 2 neg.  . Lung cancer, upper lobe ([place]) 2014    right upper lobe  . Breast cancer ([place])     PAST SURGICAL HISTORY: Past Surgical History  Procedure Laterality Date  . Intercostal nerve block  2005  . Cataract extraction w/ intraocular lens implant  2005  . Carpal tunnel release Right 2011  . Insertion central venous access device w/ subcutaneous  port  12-07-12  . Breast lumpectomy Left 2001  . Breast surgery Right 2014    total mastectomy  . Right lung upper lobectomy  03/2013  . Lobectomy Right   . Cataract extraction    . Mastectomy Right   . Colonoscopy    . Endobronchial ultrasound N/A 02/05/2015    Procedure: ENDOBRONCHIAL  ULTRASOUND;  Surgeon: Flora Lipps, MD;  Location: [facility] ORS;  Service: Cardiopulmonary;  Laterality: N/A;  . Flexible bronchoscopy N/A 07/06/2015    Procedure: FLEXIBLE BRONCHOSCOPY;  Surgeon: Allyne Gee, MD;  Location: [facility] ORS;  Service: Pulmonary;  Laterality: N/A;    FAMILY HISTORY Family History  Problem Relation Age of Onset  . Diabetes Other   . Hyperlipidemia Other   . Hypertension Other         ADVANCED DIRECTIVES: Patient does not have any advanced healthcare directive. Information has been given.   HEALTH MAINTENANCE: Social History  Substance Use Topics  . Smoking status: Former Smoker -- 0.50 packs/day for 30 years    Types: Cigarettes    Quit date: 02/03/2013  . Smokeless tobacco: Never Used     Comment: using 72m nicorette gum- 1/2 piece, 10 times per day  . Alcohol Use: No      Allergies  Allergen Reactions  . Metformin Diarrhea and Nausea Only  . Other Rash    Sage Wipes    Current Facility-Administered Medications  Medication Dose Route Frequency Provider Last Rate Last Dose  . acetaminophen (TYLENOL) tablet 650 mg  650 mg Oral Q4H PRN LEvlyn Kanner NP   650 mg at 07/30/15 1456  . butamben-tetracaine-benzocaine (CETACAINE) spray 1 spray  1 spray Topical Once SAllyne Gee MD      . Calcium Citrate-Vitamin D 315-250 MG-UNIT TABS 1 tablet  1 tablet Oral BID SFritzi Mandes MD   1 tablet at 08/01/15 1025  . citalopram (CELEXA) tablet 20 mg  20 mg Oral Daily JForest Gleason MD   20 mg at 08/01/15 1025  . enoxaparin (LOVENOX) injection 100 mg  1.5 mg/kg Subcutaneous Q24H GCammie Sickle MD   100 mg at 07/31/15 2016  . famotidine (PEPCID) tablet 20 mg  20 mg Oral BID PLaverle Hobby MD   20 mg at 08/01/15 1024  . glipiZIDE (GLUCOTROL XL) 24 hr tablet 5 mg  5 mg Oral Q breakfast LEvlyn Kanner NP   5 mg at 08/01/15 0843  . guaiFENesin-dextromethorphan (ROBITUSSIN DM) 100-10 MG/5ML syrup 10 mL  10 mL Oral Q4H PRN LEvlyn Kanner NP   10 mL  at 07/31/15 1226  . hydrochlorothiazide (HYDRODIURIL) tablet 12.5 mg  12.5 mg Oral Daily LEvlyn Kanner NP   12.5 mg at 08/01/15 1024  . insulin aspart (novoLOG) injection 0-15 Units  0-15 Units Subcutaneous TID WC GCammie Sickle MD   5 Units at 08/01/15 1644  . insulin aspart (novoLOG) injection 0-5 Units  0-5 Units Subcutaneous QHS GCammie Sickle MD   2 Units at 07/28/15 2208  . levalbuterol (XOPENEX) nebulizer solution 1.25 mg  1.25 mg Nebulization Q4H PRN Vishal Mungal, MD      . lidocaine (XYLOCAINE) 2 % jelly 1 application  1 application Topical Once SAllyne Gee MD      . nystatin (MYCOSTATIN) 100000 UNIT/ML suspension 500,000 Units  5 mL Mouth/Throat QID MLequita Asal MD   500,000 Units at 08/01/15 1817  . ondansetron (ZOFRAN) injection 4 mg  4 mg Intravenous Q6H PRN GLenetta QuakerR  Rogue Bussing, MD   4 mg at 07/20/15 0121  . PARoxetine (PAXIL) tablet 10 mg  10 mg Oral QHS Evlyn Kanner, NP   10 mg at 07/31/15 2156  . phenylephrine (NEO-SYNEPHRINE) 0.25 % nasal spray 1 spray  1 spray Each Nare Q6H PRN Allyne Gee, MD      . potassium chloride SA (K-DUR,KLOR-CON) CR tablet 20 mEq  20 mEq Oral Daily Evlyn Kanner, NP   20 mEq at 08/01/15 1024  . predniSONE (DELTASONE) tablet 60 mg  60 mg Oral Q breakfast Laverle Hobby, MD   60 mg at 08/01/15 1024  . senna-docusate (Senokot-S) tablet 1 tablet  1 tablet Oral QHS PRN Evlyn Kanner, NP      . simvastatin (ZOCOR) tablet 10 mg  10 mg Oral Daily Evlyn Kanner, NP   10 mg at 07/31/15 2156  . sodium chloride 0.9 % injection 10 mL  10 mL Intravenous PRN Cammie Sickle, MD   10 mL at 07/22/15 2010   Facility-Administered Medications Ordered in Other Encounters  Medication Dose Route Frequency Provider Last Rate Last Dose  . sodium chloride 0.9 % injection 10 mL  10 mL Intracatheter PRN Forest Gleason, MD   10 mL at 02/04/15 1027    OBJECTIVE:  Filed Vitals:   08/01/15 1700 08/01/15 1800  BP: 103/67  134/106  Pulse: 72 107  Temp:    Resp: 23 22     Body mass index is 26.33 kg/(m^2).    ECOG FS:0 - Asymptomatic  PHYSICAL EXAM: GENERAL:  Performance status is declining Patient is in mild distress MENTAL STATUS:  Alert and oriented to person, place and time. HEAD:   Normocephalic, atraumatic, face symmetric, no Cushingoid features. EYES:    Pupils equal round and reactive to light and accomodation.  No conjunctivitis or scleral icterus. ENT:  Oropharynx clear without lesion.  Tongue normal. Mucous membranes moist.  RESPIRATORY wheezing and rhonchi on both sides CARDIOVASCULAR:  Regular rate and rhythm without murmur, rub or gallop. BREAST:  Right breast : Status post mastectomy.  No evidence of recurrent disease skin changes or nipple discharge.  Left breast without masses, skin changes or nipple discharge. ABDOMEN:  Soft, non-tender, with active bowel sounds, and no hepatosplenomegaly.  No masses. BACK:  No CVA tenderness.  No tenderness on percussion of the back or rib cage. SKIN:  No rashes, ulcers or lesions. EXTREMITIES: No edema, no skin discoloration or tenderness.  No palpable cords. Swelling of the left upper extremity LYMPH NODES: No palpable cervical, supraclavicular, axillary or inguinal adenopathy  NEUROLOGICAL: Unremarkable. PSYCH:  Appropriate.   LAB RESULTS:  Admission on 07/03/2015  No results displayed because visit has over 200 results.      Lab Results  Component Value Date   LABCA2 20.3 06/11/2014     STUDIES: Dg Chest 1 View  07/31/2015  CLINICAL DATA:  54 year old female with shortness of Breath. Previous right upper lobectomy for lung cancer in 2014. Metastatic disease, left lung mass, mediastinal lymphadenopathy, and pleural based lesion. Initial encounter. EXAM: CHEST 1 VIEW COMPARISON:  07/30/2015 and earlier. FINDINGS: Portable AP upright view at 0549 hours. Since 07/22/2015 confluent left upper lobe airspace disease has slightly regressed.  Stable lung volumes. Stable cardiac size and mediastinal contours. Left chest porta cath remains in place and is accessed. Right upper lobe airspace disease has significantly regressed since 07/18/2015. No pneumothorax, pleural effusion or pulmonary edema. IMPRESSION: 1. Slightly improved dense airspace disease in the  left upper lobe, and significantly improved right upper lobe, since 07/18/2015. 2. No new cardiopulmonary abnormality identified. Electronically Signed   By: Genevie Ann M.D.   On: 07/31/2015 07:18   Dg Chest 1 View  07/30/2015  CLINICAL DATA:  Chest pain. EXAM: CHEST 1 VIEW COMPARISON:  07/26/2015. FINDINGS: Port-A-Cath noted with tip in stable position. Heart size stable. Persistent bilateral pulmonary infiltrates, particular prominent left upper lobe, no interim change. No pleural effusion or pneumothorax. No acute bony abnormality. IMPRESSION: 1. Port-A-Cath in stable position. 2. Persistent bilateral pulmonary infiltrates, particular prominent left upper lobe. No interim change. Electronically Signed   By: Marcello Moores  Register   On: 07/30/2015 07:46   Dg Chest 1 View  07/22/2015  CLINICAL DATA:  Recurrent left upper lobe lung cancer for which the patient is currently being treated, presenting with acutely worsening shortness of breath, cough and chest congestion. EXAM: Portable CHEST 1 VIEW COMPARISON:  CT chest 07/18/2015 and earlier.  PET-CT 06/14/2015. FINDINGS: Suboptimal inspiration. Cardiac silhouette upper normal in size. Dense airspace consolidation in the left upper lobe with air bronchograms, unchanged since the CT 4 days ago. Improved aeration in the right upper lobe, though patchy airspace opacities persist. Consolidation medially in the left lower lobe, unchanged. Small bilateral pleural effusions, unchanged. No new pulmonary parenchymal abnormalities. Left subclavian Port-A-Cath tip projects at or near the cavoatrial junction. IMPRESSION: Since the CT chest 4 days ago: 1. Improved  aeration in the right upper lobe, though patchy airspace opacities persist indicating residual pneumonia. 2. 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 3. No change in the consolidation in the medial right lower lobe, likely post radiation pneumonitis. 4. No new abnormalities. Electronically Signed   By: Evangeline Dakin M.D.   On: 07/22/2015 19:33   Ct Angio Chest Pe W/cm &/or Wo Cm  07/18/2015  CLINICAL DATA:  Short of breath for several weeks EXAM: CT ANGIOGRAPHY CHEST WITH CONTRAST TECHNIQUE: Multidetector CT imaging of the chest was performed using the standard protocol during bolus administration of intravenous contrast. Multiplanar CT image reconstructions and MIPs were obtained to evaluate the vascular anatomy. CONTRAST:  178m OMNIPAQUE IOHEXOL 350 MG/ML SOLN COMPARISON:  PET-CT 06/14/2015 FINDINGS: There are no filling defects in the pulmonary arterial tree to suggest acute pulmonary thromboembolism. Mediastinal adenopathy is not significantly changed since the PET-CT from 1 month ago. Left subclavian Port-A-Cath is stable. Consolidation within the apical left upper lobe and superior segment of the right lower lobe has markedly worsened. Very small bilateral pleural effusions have developed. No pneumothorax. The 1.4 cm left upper lobe lung mass on image 43 of series 8 is stable. Postoperative changes from right mastectomy are again noted. Bilateral rib fractures again noted. Right rib fractures are noticeably displaced. There are sclerotic areas in upper left antral lateral ribs without fractures which may represent rib lesions. There are also fractures and left mid to lower anterior ribs with some evidence of healing. These are all not significantly changed. Review of the MIP images confirms the above findings. IMPRESSION: No evidence of acute pulmonary thromboembolism Consolidation in the left upper lobe  and superior segment of the right lower lobe has worsened. This may represent pneumonia, pneumonitis, or infiltration with tumor. New very small pleural effusions. Stable mediastinal adenopathy. Bilateral of rib fractures are again noted and are not significantly changed. They may represent pathologic fractures. Electronically Signed   By: ARodena GoldmannD.  On: 07/18/2015 11:02   US Venous Img Upper Uni Left  07/05/2015  CLINICAL DATA:  Left upper extremity swelling x1 month. Diabetes. Left subclavian surgically placed port catheter. EXAM: LEFT UPPER EXTREMITY VENOUS DOPPLER ULTRASOUND TECHNIQUE: Gray-scale sonography with graded compression, as well as color Doppler and duplex ultrasound were performed to evaluate the upper extremity deep venous system from the level of the subclavian vein and including the jugular, axillary, basilic and upper cephalic vein. Spectral Doppler was utilized to evaluate flow at rest and with distal augmentation maneuvers. COMPARISON:  CT 07/18/2015 and previous FINDINGS: Thrombus within deep veins: Noncompressible thrombus in the axillary vein over short segment. Brachial, radial, ulnar, basilic, and cephalic veins remain patent and compressible, with somewhat monophasic waveforms probably related to the central obstruction. There is a monophasic antegrade waveform in the visualized subclavian vein, without evidence of transmitted right atrial pulsations. Other findings: Images of contralateral subclavian vein are unremarkable. IMPRESSION: 1. Isolated occlusive left axillary  DVT. Electronically Signed   By: Lucrezia Europe M.D.   On: 07/14/2015 16:44   Dg Chest Port 1 View  07/26/2015  CLINICAL DATA:  Respiratory failure. History of lung and breast cancer. EXAM: PORTABLE CHEST 1 VIEW COMPARISON:  07/22/2015 FINDINGS: Left-sided injectable central venous catheter is stable. Cardiomediastinal silhouette is normal. Mediastinal contours appear intact. There is no evidence of  pneumothorax. Dense left upper lobe airspace consolidation/pulmonary mass is stable in appearance. There also remains a patchy airspace consolidation in the right upper lobe. No definite evidence of pleural effusion. Osseous structures are without acute abnormality. There has been a prior right mastectomy. IMPRESSION: No significant change in the appearance of the lungs with dense left upper lobe airspace consolidation and/or pulmonary mass, and patchy airspace consolidation in the right upper lobe. Electronically Signed   By: Fidela Salisbury M.D.   On: 07/26/2015 09:31    ASSESSMENT: 1.  Patient has a history of lung cancer bilateral recently had stage III disease status post radiation chemotherapy now admitted with acute on chronic respiratory failure or suspected secondary to radiation pneumonitis On steroid and on   Empirical  antibiotics. On high flow oxygen Negative bronchus for any malignancy and cultures are negative Probably would need transthoracic biopsy or percutaneous CT-guided biopsy to rule out malignancy  Discussed situation with pulmonologist and it appears that patient might be on were just getting intubated because of requirement of   100% oxygen.  Left upper extremity swelling is improved  I had detailed discussion again with Dr. Simone Curia.  And possibility of transferring to regular floor was discussed. Continue supportive therapy  I discussed with the patient.  She is  in agreement that she does not want any CPR or intubation but would like to continue supportive measures with steroid IV antibiotics.  Pressor support.  Will put patient on limited code.  I discussed situation with the intensivist . Will start patient on antidepressant medication Try to get patient up in the chair If feasible patient can be transferred to regular floor and moderate occupational and physical therapy can be started  Leukocytosis is most likely due to steroid   Malignant neoplasm  of female breast   Staging form: Breast, AJCC 7th Edition     Clinical: Stage IIIA (T3, N1, M0) - Signed by Forest Gleason, MD on 01/26/2015 Malignant neoplasm of upper lobe, bronchus or lung   Staging form: Lung, AJCC 7th Edition     Clinical: Stage IA (T1b, N0, M0) - Signed by Delorise Shiner  Tomma Ehinger, MD on 01/26/2015   Forest Gleason, MD   08/01/2015 7:05 PM

## 2015-08-01 DEATH — deceased

## 2015-08-02 DIAGNOSIS — J701 Chronic and other pulmonary manifestations due to radiation: Secondary | ICD-10-CM | POA: Diagnosis not present

## 2015-08-02 DIAGNOSIS — J90 Pleural effusion, not elsewhere classified: Secondary | ICD-10-CM | POA: Diagnosis not present

## 2015-08-02 DIAGNOSIS — J189 Pneumonia, unspecified organism: Secondary | ICD-10-CM | POA: Diagnosis not present

## 2015-08-02 DIAGNOSIS — C3412 Malignant neoplasm of upper lobe, left bronchus or lung: Secondary | ICD-10-CM | POA: Diagnosis not present

## 2015-08-02 DIAGNOSIS — C3431 Malignant neoplasm of lower lobe, right bronchus or lung: Secondary | ICD-10-CM | POA: Diagnosis not present

## 2015-08-02 DIAGNOSIS — C3490 Malignant neoplasm of unspecified part of unspecified bronchus or lung: Secondary | ICD-10-CM | POA: Diagnosis not present

## 2015-08-02 DIAGNOSIS — J9601 Acute respiratory failure with hypoxia: Secondary | ICD-10-CM | POA: Diagnosis not present

## 2015-08-02 LAB — CBC
HCT: 40.9 % (ref 35.0–47.0)
HEMOGLOBIN: 13.2 g/dL (ref 12.0–16.0)
MCH: 29.7 pg (ref 26.0–34.0)
MCHC: 32.3 g/dL (ref 32.0–36.0)
MCV: 91.9 fL (ref 80.0–100.0)
Platelets: 335 10*3/uL (ref 150–440)
RBC: 4.45 MIL/uL (ref 3.80–5.20)
RDW: 15.7 % — ABNORMAL HIGH (ref 11.5–14.5)
WBC: 28.3 10*3/uL — ABNORMAL HIGH (ref 3.6–11.0)

## 2015-08-02 LAB — BASIC METABOLIC PANEL
ANION GAP: 10 (ref 5–15)
BUN: 27 mg/dL — AB (ref 6–20)
CHLORIDE: 98 mmol/L — AB (ref 101–111)
CO2: 29 mmol/L (ref 22–32)
Calcium: 10 mg/dL (ref 8.9–10.3)
Creatinine, Ser: 0.98 mg/dL (ref 0.44–1.00)
GFR calc Af Amer: >60 mL/min (ref >60)
GFR calc non Af Amer: >60 mL/min (ref >60)
Glucose, Bld: 115 mg/dL — ABNORMAL HIGH (ref 65–99)
POTASSIUM: 3.7 mmol/L (ref 3.5–5.1)
SODIUM: 137 mmol/L (ref 135–145)

## 2015-08-02 LAB — GLUCOSE, CAPILLARY
GLUCOSE-CAPILLARY: 117 mg/dL — AB (ref 65–99)
GLUCOSE-CAPILLARY: 145 mg/dL — AB (ref 65–99)
GLUCOSE-CAPILLARY: 147 mg/dL — AB (ref 65–99)
Glucose-Capillary: 190 mg/dL — ABNORMAL HIGH (ref 65–99)

## 2015-08-02 LAB — MAGNESIUM: MAGNESIUM: 1.9 mg/dL (ref 1.7–2.4)

## 2015-08-02 LAB — PHOSPHORUS: PHOSPHORUS: 3.5 mg/dL (ref 2.5–4.6)

## 2015-08-02 MED ORDER — ALPRAZOLAM 0.5 MG PO TABS
0.5000 mg | ORAL_TABLET | Freq: Four times a day (QID) | ORAL | Status: DC | PRN
  Administered 2015-08-05: 0.5 mg via ORAL
  Filled 2015-08-02: qty 1

## 2015-08-02 MED ORDER — LORAZEPAM 2 MG/ML IJ SOLN
2.0000 mg | Freq: Four times a day (QID) | INTRAMUSCULAR | Status: DC | PRN
  Administered 2015-08-02 – 2015-08-03 (×4): 2 mg via INTRAVENOUS
  Filled 2015-08-02 (×4): qty 1

## 2015-08-02 NOTE — Progress Notes (Signed)
Palliative Care Update  I was asked ( in person) to consult on pt. By Georgeanne Nim NP earlier today.    However, there is as yet no order for a consult and it appears from the notes that this consult can wait until Monday ( There seems to be a plan to continue hi-flow oxygen etc, over the weekend and to transfer to the floor for comfort care on Monday --as per note by Georgeanne Nim, NP for cancer center-as related to her by Dr. Alphonsus Sias, [place] Physician Consultant.)  I informed nursing that I would need an order entered by her attending for a consult. Dr Rogue Bussing is listed as being pt's attending.     I will be glad to see pt on Monday if consult is still desired.  Colleen Can, MD

## 2015-08-02 NOTE — Progress Notes (Signed)
Inpatient Diabetes Program Recommendations  AACE/ADA: New Consensus Statement on Inpatient Glycemic Control (2015)  Target Ranges:  Prepandial:   less than 140 mg/dL      Peak postprandial:   less than 180 mg/dL (1-2 hours)      Critically ill patients:  140 - 180 mg/dL   Review of Glycemic Control  Results for FIONNA, MERRIOTT (MRN [id number]) as of 08/02/2015 13:12  Ref. Range 08/01/2015 11:23 08/01/2015 16:18 08/01/2015 20:51 08/02/2015 07:07 08/02/2015 11:08  Glucose-Capillary Latest Ref Range: 65-99 mg/dL 207 (H) 231 (H) 175 (H) 117 (H) 145 (H)    Diabetes history: DM2 Outpatient Diabetes medications: Glipizide XL 5 mg daily Current orders for Inpatient glycemic control: Glipizide 5 mg QAM, Novolog 0-15 units TID with meals, Novolog 0-5 units HS  Inpatient Diabetes Program Recommendations: Patient is ordered 60 mg Prednisone qam.   May need to increase Novolog correction insulin to resistant scale 0-20 units tid if post prandial blood sugars become elevated as a result of the steroids.  [name], RN, BA, MHA, CDE Diabetes Coordinator Inpatient Diabetes Program  [phone] (Team Pager) [phone] ([place]) 08/02/2015 1:18 PM

## 2015-08-02 NOTE — Progress Notes (Signed)
Patient was feeling short of breath and her oxygen saturation was in the mid 80's on HFNC running at 100% Placed patient on BIPAP and gave xopenex treatment due to shortness of breath and low o2 sats,patients o2 sats have come up to 91%, will continue to monitor patient.

## 2015-08-02 NOTE — Progress Notes (Signed)
[facility] Progess Note    ASSESSMENT/PLAN   54 year old female with bilateral pneumonia, recurrent left upper lobe lung cancer, shortness of breath, admitted to the ICU for respiratory distress and hypoxia  PULMONARY Respiratory failure-hypoxia Recurrent  lung cancer -Given the patient's severe refractory hypoxemia with worsening respiratory failure, I suspect this is likely due to radiation fibrosis and/or advancing cancer. -The patient has not responded to empiric steroids, she is currently being maintained on 60 mg of prednisone daily. She has completed a course of broad-spectrum antibiotics without any significant improvement. This makes it less likely that the patient's respiratory failure is due to pneumonia or reversible radiation pneumonitis. -The patient was switched from high flow nasal cannula to BiPAP this morning, due to dyspnea and air hunger. -Lung cancer/stage III status post chemoradiation, finished 04/10/2015.  P:  -Given her continued lack of response to empiric therapy, now requiring nearly maximal amounts of high flow oxygen. We'll transition to comfort measures only, likely on Monday. -Continue empiric steroids and high flow nasal cannula for today, we'll likely transfer to the floor on Monday with nasal cannula oxygen as 6-10 L and unmonitored, sats. Plan at that time will be transferred to inpatient hospice. -We'll start IV morphine for air hunger. - stopped scheduled nebs due to rebound anxiety and bronchospams, xopenox PRN -Status post bronchoscopy with BAL on 11/23 - cultures negative to date -Patient does have a mild anxiety component with her respiratory distress, 1 mg morphine IV every 6 hours for shortness of breath and chest pain 3 days   CARDIOVASCULAR -Continue to monitor hemodynamic status   RENAL -Monitor electrolytes -ICU to try protocol replacement  GASTROINTESTINAL SUP - PPI  HEMATOLOGIC History of breast  cancer Stage III left upper lobe lung cancer -Status post chemoradiation 04/10/2015. The patient also received chest wall radiation on the left side for breast cancer in 2015.  -Hematology/oncology following -Continue with monitoring of CBC   INFECTIOUS Bilateral pneumonia -Continue with current antibiotics, for a total of 10 days -BAL with normal flora   ENDOCRINE -ssi  NEUROLOGIC RASS goal: 0 ---------------------------------------   ----------------------------------------   Name: Brittney OSHIELDS MRN: [id number] DOB: 08-Jun-1961    ADMISSION DATE:  07/23/2015    CHIEF COMPLAINT. Dyspnea   Subjective Patient has no particular complaints today, she is awake, alert and conversational. She does not appear to be particularly dyspneic, even though she is requiring a high amount of oxygen. Her spirits appear improved today.  Review of Systems:  The patient denies chest pain, PND, cough, expectoration. The remainder of the review systems was reviewed with this patient and found to be negative.    VITAL SIGNS: Temp:  [97.3 F (36.3 C)-98.5 F (36.9 C)] 97.3 F (36.3 C) (12/02 0800) Pulse Rate:  [72-131] 131 (12/02 1100) Resp:  [17-31] 31 (12/02 1100) BP: (83-135)/(60-106) 94/69 mmHg (12/02 1100) SpO2:  [89 %-98 %] 97 % (12/02 1208) FiO2 (%):  [95 %-100 %] 99 % (12/02 1208) HEMODYNAMICS:   VENTILATOR SETTINGS: Vent Mode:  [-]  FiO2 (%):  [95 %-100 %] 99 % INTAKE / OUTPUT:  Intake/Output Summary (Last 24 hours) at 08/02/15 1253 Last data filed at 08/02/15 1143  Gross per 24 hour  Intake    520 ml  Output   1225 ml  Net   -705 ml    PHYSICAL EXAMINATION: Physical Examination:   VS: BP 94/69 mmHg  Pulse 131  Temp(Src) 97.3 F (36.3 C) (Axillary)  Resp 31  Ht '5\' 2"'$  (1.575 m)  Wt 65.318 kg (144 lb)  BMI 26.33 kg/m2  SpO2 97%  General Appearance: No distress  Neuro:without focal findings, mental status normal. HEENT: PERRLA, EOM  intact. Pulmonadecreased breath sounds bilaterally.rdiovascularNormal S1,S2.  No m/r/g.   Abdomen: Benign, Soft, non-tender. Renal:  No costovertebral tenderness  GU:  Not performed at this time. Endocrine: No evident thyromegaly. Skin:   warm, no rashes, no ecchymosis  Extremities: normal, no cyanosis, clubbing.   LABS:   LABORATORY PANEL:   CBC  Recent Labs Lab 08/02/15 0640  WBC 28.3*  HGB 13.2  HCT 40.9  PLT 335    Chemistries   Recent Labs Lab 07/31/15 0508 08/02/15 0640  NA 138 137  K 3.4* 3.7  CL 99* 98*  CO2 30 29  GLUCOSE 95 115*  BUN 25* 27*  CREATININE 0.93 0.98  CALCIUM 10.0 10.0  MG  --  1.9  PHOS  --  3.5  AST 15  --   ALT 17  --   ALKPHOS 114  --   BILITOT 0.2*  --      Recent Labs Lab 08/01/15 0731 08/01/15 1123 08/01/15 1618 08/01/15 2051 08/02/15 0707 08/02/15 1108  GLUCAP 106* 207* 231* 175* 117* 145*   No results for input(s): PHART, PCO2ART, PO2ART in the last 168 hours.  Recent Labs Lab 07/31/15 0508  AST 15  ALT 17  ALKPHOS 114  BILITOT 0.2*  ALBUMIN 2.9*    Cardiac Enzymes No results for input(s): TROPONINI in the last 168 hours.  RADIOLOGY:  No results found.     --Marda Stalker, MD.  [facility]  Patricia Pesa, M.D.  Vilinda Boehringer, M.D.  Merton Border, M.D  [place].  I have personally obtained a history, examined the patient, evaluated laboratory and imaging results, formulated the assessment and plan and placed orders. The case was discussed with the critical care RN, nurse manager, nutrition, ICU pharmacist. The Patient requires high complexity decision making for assessment and support, frequent evaluation and titration of therapies, application of advanced monitoring technologies and extensive interpretation of multiple databases. The patient has critical illness that could lead imminently to failure of 1 or more organ systems and requires the highest level  of physician preparedness to intervene.  Critical Care Time devoted to patient care services described in this note is 35 minutes and is exclusive of time spent in procedures.

## 2015-08-02 NOTE — Progress Notes (Signed)
[place] @ [facility] [phone]  Fax:([phone] [place]: 02-Jun-1961  MR#: [id number]  GNF#:[id number]  Patient Care Team: [name], MD as PCP - General (Family Medicine) [name], MD (General Surgery) [name], RN as [place] Management [name], MD as Referring Physician (Family Medicine)  CHIEF COMPLAINT:  No chief complaint on file.  Oncology History   35. 54 year old female status post recent left breast cancer in 2005  2. Right breast cancer, locally advanced stage IIIa (pyT3, N1, M0), invasive mammary carcinoma with mucin production. Status post right modified radical mastectomy for 8 cm lesion with one of 4 sentinel lymph nodes positive for metastatic disease. Tumor is ER positive PR negative HER-2/neu not overexpressed. 3. Completed neoadjuvant chemotherapy with chest wall radiation therapy,  Finished on October 21, 2013. 4. Started letrazole 2.5 mg by mouth daily from October 24, 2013. 5. Carcinoma of lung.  Right upper lobe, status post right upper lobectomy, [id number]. 6.  Left upper lobe mass with mediastinal lymph node biopsies positive for poorly differentiated adenocarcinoma T1 N1 M0 tumor III Starting chemoradiation therapy in July of 2016 (second primary) Patient had EUS in June of 2016.  Bilateral hilar adenopathy which has been biopsied was positive for metastatic adenocarcinoma consistent with lung primary Starting chemoradiation therapy in July of 2016 7.  Has finished 6 cycles of chemotherapy with carboplatinum and Taxol concurrent with radiation therapy on April 10, 2015       INTERVAL HISTORY:  54 year old African-American lady with a history of multiple malignancies admitted in hospital with acute respiratory failure.  Bilateral upper lobe infiltrates and suspected radiation pneumonitis versus progressing cancer versus infection. Patient is currently in CCU on 100% BiPAP.  She is alert and answering questions appropriately. Although hard to understand, reports feeling uncomfortable. She appears dyspneic and states that she is miserable.  Review of systems General status: Patient reports feeling miserable. HEENT: Difficulty swallowing. Lungs: Increasing shortness of breath. Cough. Dyspneic, worsens with talking. Cardiac: No chest pain or paroxysmal nocturnal dyspnea GI: No nausea no vomiting no diarrhea no abdominal pain Skin: No rash Lower extremity no swelling, left upper extremity swelling has improved.  Neurological system: No tingling.  No numbness.  No other focal signs Musculoskeletal system: no bony pains    PAST MEDICAL HISTORY: Past Medical History  Diagnosis Date  . Unspecified essential hypertension   . Kidney failure   . Personal history of tobacco use, presenting hazards to health   . Cataract   . Personal history of malignant neoplasm of breast   . Breast screening, unspecified   . Special screening for malignant neoplasms, colon   . Diabetes mellitus without complication ([place])   . MDRO (multiple drug resistant organisms) resistance   . H. pylori duodenitis   . Complication of anesthesia     BP DROPPED DURING LOBECTOMY IN 2014  . Cancer [facility]) 2001    left breast cancer s/p L/SN/R in 2001  . Cancer [facility]) 2014    right breast invasive CA, Er pos,Pr neg, Her 2 neg.  . Lung cancer, upper lobe ([place]) 2014    right upper lobe  . Breast cancer ([place])     PAST SURGICAL HISTORY: Past Surgical History  Procedure Laterality Date  . Intercostal nerve block  2005  . Cataract extraction w/ intraocular lens implant  2005  . Carpal tunnel release Right 2011  . Insertion central venous access device w/ subcutaneous port  12-07-12  . Breast lumpectomy Left 2001  . Breast surgery Right 2014    total mastectomy  . Right lung upper lobectomy  03/2013  . Lobectomy Right   . Cataract extraction    . Mastectomy Right   . Colonoscopy    .  Endobronchial ultrasound N/A 02/05/2015    Procedure: ENDOBRONCHIAL ULTRASOUND;  Surgeon: Flora Lipps, MD;  Location: [facility] ORS;  Service: Cardiopulmonary;  Laterality: N/A;  . Flexible bronchoscopy N/A 07/11/2015    Procedure: FLEXIBLE BRONCHOSCOPY;  Surgeon: Allyne Gee, MD;  Location: [facility] ORS;  Service: Pulmonary;  Laterality: N/A;    FAMILY HISTORY Family History  Problem Relation Age of Onset  . Diabetes Other   . Hyperlipidemia Other   . Hypertension Other         ADVANCED DIRECTIVES: Patient does not have any advanced healthcare directive. Information has been given.   HEALTH MAINTENANCE: Social History  Substance Use Topics  . Smoking status: Former Smoker -- 0.50 packs/day for 30 years    Types: Cigarettes    Quit date: 02/03/2013  . Smokeless tobacco: Never Used     Comment: using 60m nicorette gum- 1/2 piece, 10 times per day  . Alcohol Use: No      Allergies  Allergen Reactions  . Metformin Diarrhea and Nausea Only  . Other Rash    Sage Wipes    Current Facility-Administered Medications  Medication Dose Route Frequency Provider Last Rate Last Dose  . acetaminophen (TYLENOL) tablet 650 mg  650 mg Oral Q4H PRN LEvlyn Kanner NP   650 mg at 07/30/15 1456  . ALPRAZolam (XANAX) tablet 0.5 mg  0.5 mg Oral Q6H PRN PLaverle Hobby MD      . butamben-tetracaine-benzocaine (CETACAINE) spray 1 spray  1 spray Topical Once SAllyne Gee MD      . Calcium Citrate-Vitamin D 315-250 MG-UNIT TABS 1 tablet  1 tablet Oral BID SFritzi Mandes MD   1 tablet at 08/02/15 1020  . citalopram (CELEXA) tablet 20 mg  20 mg Oral Daily JForest Gleason MD   20 mg at 08/02/15 1022  . enoxaparin (LOVENOX) injection 100 mg  1.5 mg/kg Subcutaneous Q24H GCammie Sickle MD   100 mg at 08/01/15 2031  . famotidine (PEPCID) tablet 20 mg  20 mg Oral BID PLaverle Hobby MD   20 mg at 08/02/15 1022  . glipiZIDE (GLUCOTROL XL) 24 hr tablet 5 mg  5 mg Oral Q breakfast LEvlyn Kanner NP   5 mg at 08/02/15 1022  . guaiFENesin-dextromethorphan (ROBITUSSIN DM) 100-10 MG/5ML syrup 10 mL  10 mL Oral Q4H PRN LEvlyn Kanner NP   10 mL at 07/31/15 1226  . hydrochlorothiazide (HYDRODIURIL) tablet 12.5 mg  12.5 mg Oral Daily LEvlyn Kanner NP   12.5 mg at 08/02/15 1021  . insulin aspart (novoLOG) injection 0-15 Units  0-15 Units Subcutaneous TID WC GCammie Sickle MD   5 Units at 08/01/15 1644  . insulin aspart (novoLOG) injection 0-5 Units  0-5 Units Subcutaneous QHS GCammie Sickle MD   2 Units at 07/28/15 2208  . levalbuterol (XOPENEX) nebulizer solution 1.25 mg  1.25 mg Nebulization Q4H PRN Vishal Mungal, MD   1.25 mg at 08/02/15 0534  . lidocaine (XYLOCAINE) 2 % jelly 1 application  1 application Topical Once SAllyne Gee MD      . LORazepam (ATIVAN) injection 2 mg  2 mg Intravenous Q6H PRN PLaverle Hobby MD   2  mg at 08/02/15 1019  . nystatin (MYCOSTATIN) 100000 UNIT/ML suspension 500,000 Units  5 mL Mouth/Throat QID Lequita Asal, MD   500,000 Units at 08/02/15 1021  . ondansetron (ZOFRAN) injection 4 mg  4 mg Intravenous Q6H PRN Cammie Sickle, MD   4 mg at 07/20/15 0121  . PARoxetine (PAXIL) tablet 10 mg  10 mg Oral QHS Evlyn Kanner, NP   10 mg at 08/01/15 2221  . phenylephrine (NEO-SYNEPHRINE) 0.25 % nasal spray 1 spray  1 spray Each Nare Q6H PRN Allyne Gee, MD      . potassium chloride SA (K-DUR,KLOR-CON) CR tablet 20 mEq  20 mEq Oral Daily Evlyn Kanner, NP   20 mEq at 08/02/15 1021  . predniSONE (DELTASONE) tablet 60 mg  60 mg Oral Q breakfast Laverle Hobby, MD   60 mg at 08/02/15 1021  . senna-docusate (Senokot-S) tablet 1 tablet  1 tablet Oral QHS PRN Evlyn Kanner, NP      . simvastatin (ZOCOR) tablet 10 mg  10 mg Oral Daily Evlyn Kanner, NP   10 mg at 08/01/15 2031  . sodium chloride 0.9 % injection 10 mL  10 mL Intravenous PRN Cammie Sickle, MD   10 mL at 07/22/15 2010   Facility-Administered  Medications Ordered in Other Encounters  Medication Dose Route Frequency Provider Last Rate Last Dose  . sodium chloride 0.9 % injection 10 mL  10 mL Intracatheter PRN Forest Gleason, MD   10 mL at 02/04/15 1027    OBJECTIVE:  Filed Vitals:   08/02/15 1000 08/02/15 1100  BP: 106/85 94/69  Pulse: 118 131  Temp:    Resp: 26 31     Body mass index is 26.33 kg/(m^2).    ECOG FS:3 - Symptomatic, >50% confined to bed  PHYSICAL EXAM: GENERAL:  Performance status is declining, patient in moderate amounts distress. Increasing dyspnea MENTAL STATUS:  Alert and oriented to person, place and time. ENT:  Oropharynx clear without lesion.  Tongue normal. Mucous membranes dry, currently on 100% BiPAP.  RESPIRATORY: wheezing and rhonchi on both sides CARDIOVASCULAR:  Tachycardia  SKIN:  No rashes, ulcers or lesions. EXTREMITIES: No edema, no skin discoloration or tenderness.  No palpable cords. Swelling of the left upper extremity has improved. LYMPH NODES: No palpable cervical, supraclavicular, axillary or inguinal adenopathy  NEUROLOGICAL: Unremarkable. PSYCH:  Appropriate.   LAB RESULTS:  Admission on 07/22/2015  No results displayed because visit has over 200 results.      Lab Results  Component Value Date   LABCA2 20.3 06/11/2014     STUDIES: Dg Chest 1 View  07/31/2015  CLINICAL DATA:  54 year old female with shortness of Breath. Previous right upper lobectomy for lung cancer in 2014. Metastatic disease, left lung mass, mediastinal lymphadenopathy, and pleural based lesion. Initial encounter. EXAM: CHEST 1 VIEW COMPARISON:  07/30/2015 and earlier. FINDINGS: Portable AP upright view at 0549 hours. Since 07/22/2015 confluent left upper lobe airspace disease has slightly regressed. Stable lung volumes. Stable cardiac size and mediastinal contours. Left chest porta cath remains in place and is accessed. Right upper lobe airspace disease has significantly regressed since 07/18/2015. No  pneumothorax, pleural effusion or pulmonary edema. IMPRESSION: 1. Slightly improved dense airspace disease in the left upper lobe, and significantly improved right upper lobe, since 07/18/2015. 2. No new cardiopulmonary abnormality identified. Electronically Signed   By: Genevie Ann M.D.   On: 07/31/2015 07:18   Dg Chest 1 View  07/30/2015  CLINICAL DATA:  Chest pain. EXAM: CHEST 1 VIEW COMPARISON:  07/26/2015. FINDINGS: Port-A-Cath noted with tip in stable position. Heart size stable. Persistent bilateral pulmonary infiltrates, particular prominent left upper lobe, no interim change. No pleural effusion or pneumothorax. No acute bony abnormality. IMPRESSION: 1. Port-A-Cath in stable position. 2. Persistent bilateral pulmonary infiltrates, particular prominent left upper lobe. No interim change. Electronically Signed   By: Marcello Moores  Register   On: 07/30/2015 07:46   Dg Chest 1 View  07/22/2015  CLINICAL DATA:  Recurrent left upper lobe lung cancer for which the patient is currently being treated, presenting with acutely worsening shortness of breath, cough and chest congestion. EXAM: Portable CHEST 1 VIEW COMPARISON:  CT chest 07/18/2015 and earlier.  PET-CT 06/14/2015. FINDINGS: Suboptimal inspiration. Cardiac silhouette upper normal in size. Dense airspace consolidation in the left upper lobe with air bronchograms, unchanged since the CT 4 days ago. Improved aeration in the right upper lobe, though patchy airspace opacities persist. Consolidation medially in the left lower lobe, unchanged. Small bilateral pleural effusions, unchanged. No new pulmonary parenchymal abnormalities. Left subclavian Port-A-Cath tip projects at or near the cavoatrial junction. IMPRESSION: Since the CT chest 4 days ago: 1. Improved aeration in the right upper lobe, though patchy airspace opacities persist indicating residual pneumonia. 2. No change in the dense consolidation with air bronchograms in the left upper lobe, likely pneumonia  superimposed upon radiation pneumonitis. 3. No change in the consolidation in the medial right lower lobe, likely post radiation pneumonitis. 4. No new abnormalities. Electronically Signed   By: Evangeline Dakin M.D.   On: 07/22/2015 19:33   Ct Angio Chest Pe W/cm &/or Wo Cm  07/18/2015  CLINICAL DATA:  Short of breath for several weeks EXAM: CT ANGIOGRAPHY CHEST WITH CONTRAST TECHNIQUE: Multidetector CT imaging of the chest was performed using the standard protocol during bolus administration of intravenous contrast. Multiplanar CT image reconstructions and MIPs were obtained to evaluate the vascular anatomy. CONTRAST:  129m OMNIPAQUE IOHEXOL 350 MG/ML SOLN COMPARISON:  PET-CT 06/14/2015 FINDINGS: There are no filling defects in the pulmonary arterial tree to suggest acute pulmonary thromboembolism. Mediastinal adenopathy is not significantly changed since the PET-CT from 1 month ago. Left subclavian Port-A-Cath is stable. Consolidation within the apical left upper lobe and superior segment of the right lower lobe has markedly worsened. Very small bilateral pleural effusions have developed. No pneumothorax. The 1.4 cm left upper lobe lung mass on image 43 of series 8 is stable. Postoperative changes from right mastectomy are again noted. Bilateral rib fractures again noted. Right rib fractures are noticeably displaced. There are sclerotic areas in upper left antral lateral ribs without fractures which may represent rib lesions. There are also fractures and left mid to lower anterior ribs with some evidence of healing. These are all not significantly changed. Review of the MIP images confirms the above findings. IMPRESSION: No evidence of acute pulmonary thromboembolism Consolidation in the left upper lobe and superior segment of the right lower lobe has worsened. This may represent pneumonia, pneumonitis, or infiltration with tumor. New very small pleural effusions. Stable mediastinal adenopathy. Bilateral of  rib fractures are again noted and are not significantly changed. They may represent pathologic fractures. Electronically Signed   By: AMarybelle KillingsM.D.   On: 07/18/2015 11:02   [place]Venous Img Upper Uni Left  07/20/2015  CLINICAL DATA:  Left upper extremity swelling x1 month. Diabetes. Left subclavian surgically placed port catheter. EXAM: LEFT UPPER EXTREMITY VENOUS DOPPLER ULTRASOUND TECHNIQUE: Gray-scale  sonography with graded compression, as well as color Doppler and duplex ultrasound were performed to evaluate the upper extremity deep venous system from the level of the subclavian vein and including the jugular, axillary, basilic and upper cephalic vein. Spectral Doppler was utilized to evaluate flow at rest and with distal augmentation maneuvers. COMPARISON:  CT 07/18/2015 and previous FINDINGS: Thrombus within deep veins: Noncompressible thrombus in the axillary vein over short segment. Brachial, radial, ulnar, basilic, and cephalic veins remain patent and compressible, with somewhat monophasic waveforms probably related to the central obstruction. There is a monophasic antegrade waveform in the visualized subclavian vein, without evidence of transmitted right atrial pulsations. Other findings: Images of contralateral subclavian vein are unremarkable. IMPRESSION: 1. Isolated occlusive left axillary  DVT. Electronically Signed   By: Lucrezia Europe M.D.   On: 07/21/2015 16:44   Dg Chest Port 1 View  07/26/2015  CLINICAL DATA:  Respiratory failure. History of lung and breast cancer. EXAM: PORTABLE CHEST 1 VIEW COMPARISON:  07/22/2015 FINDINGS: Left-sided injectable central venous catheter is stable. Cardiomediastinal silhouette is normal. Mediastinal contours appear intact. There is no evidence of pneumothorax. Dense left upper lobe airspace consolidation/pulmonary mass is stable in appearance. There also remains a patchy airspace consolidation in the right upper lobe. No definite evidence of pleural effusion.  Osseous structures are without acute abnormality. There has been a prior right mastectomy. IMPRESSION: No significant change in the appearance of the lungs with dense left upper lobe airspace consolidation and/or pulmonary mass, and patchy airspace consolidation in the right upper lobe. Electronically Signed   By: Fidela Salisbury M.D.   On: 07/26/2015 09:31    ASSESSMENT: 1.  Patient has a history of lung cancer bilateral recently had stage III disease status post radiation chemotherapy now admitted with acute on chronic respiratory failure or suspected secondary to radiation pneumonitis as well as possibly infectious disease. She remains on 60 mg of steroid as well as empirical antibiotics. She is currently also on 100% BiPAP oxygen. Negative bronchoscopy for any malignancy and cultures are negative. Probably would need transthoracic biopsy or percutaneous CT-guided biopsy to rule out malignancy As per intensivist note, Dr. Ashby Dawes, has discussed with patient poor overall prognosis. Patient continues to be severely dyspneic as well as tachycardic, also with severe air hunger. Per RN, morphine has been added. Patient has also agreed to DO NOT RESUSCITATE. Decision was made to continue with empiric steroids and high flow oxygen over the weekend. IV morphine has also been ordered. Plan to initiate comfort care measures on a regular medical floor as early as Monday.  We will continue to follow through the course of her hospital stay.  Malignant neoplasm of female breast   Staging form: Breast, AJCC 7th Edition     Clinical: Stage IIIA (T3, N1, M0) - Signed by Forest Gleason, MD on 01/26/2015 Malignant neoplasm of upper lobe, bronchus or lung   Staging form: Lung, AJCC 7th Edition     Clinical: Stage IA (T1b, N0, M0) - Signed by Forest Gleason, MD on 01/26/2015   Evlyn Kanner, NP   08/02/2015 2:36 PM

## 2015-08-02 NOTE — Progress Notes (Signed)
   08/02/15 1100  Clinical Encounter Type  Visited With Patient;Other (Comment)  Visit Type Follow-up;Spiritual support  Referral From Other (Comment)  Consult/Referral To Chaplain  Spiritual Encounters  Spiritual Needs Prayer;Ritual;Emotional  Stress Factors  Patient Stress Factors Other (Comment)  Chaplains Thomas and I were rounding in the unit and staff asked for a visit and prayer with the patient. Went in with the patient and asked desire and she confirmed. We prayed for her and provided a compassionate presence. Chaplain Melesa Lecy A. [place] [phone]

## 2015-08-02 NOTE — Progress Notes (Signed)
RT to room to remove patient from [place] and placed her on HFNC.Marland Kitchen  Patient tolerating well at this time.

## 2015-08-02 NOTE — Progress Notes (Signed)
Pt continues to have severe DOE, o2 sats down to 70's with movement. Pt currently on HFNC at 50%, and stating she feels like she can not breathe. O2 sats 92% at this time. Pts BP low at times, after lorazepam given, but quickly increases once awakened from sleep. Pt in sinus tachycardia for most of the day, up to 150's, Md aware. 461m of urine throughut the day.  No pain reported at this time, but MD was asked to write a PRN morphine order for air hunger is needed. Md agreed, but no new orders at this time. Pt with severe anxiety and air hunger, Lorazepam ordered and given, and effective at this time. Pt drowsy at this time after lorazepam given, family at bedside and given updates regarding plan of care.

## 2015-08-03 DIAGNOSIS — J189 Pneumonia, unspecified organism: Secondary | ICD-10-CM | POA: Diagnosis not present

## 2015-08-03 DIAGNOSIS — J9601 Acute respiratory failure with hypoxia: Secondary | ICD-10-CM | POA: Diagnosis not present

## 2015-08-03 DIAGNOSIS — J701 Chronic and other pulmonary manifestations due to radiation: Secondary | ICD-10-CM | POA: Diagnosis not present

## 2015-08-03 DIAGNOSIS — C3490 Malignant neoplasm of unspecified part of unspecified bronchus or lung: Secondary | ICD-10-CM | POA: Diagnosis not present

## 2015-08-03 LAB — BASIC METABOLIC PANEL
ANION GAP: 9 (ref 5–15)
BUN: 33 mg/dL — AB (ref 6–20)
CHLORIDE: 101 mmol/L (ref 101–111)
CO2: 30 mmol/L (ref 22–32)
Calcium: 10.3 mg/dL (ref 8.9–10.3)
Creatinine, Ser: 0.97 mg/dL (ref 0.44–1.00)
GFR calc Af Amer: >60 mL/min (ref >60)
GFR calc non Af Amer: >60 mL/min (ref >60)
Glucose, Bld: 95 mg/dL (ref 65–99)
POTASSIUM: 4.2 mmol/L (ref 3.5–5.1)
SODIUM: 140 mmol/L (ref 135–145)

## 2015-08-03 LAB — GLUCOSE, CAPILLARY
GLUCOSE-CAPILLARY: 81 mg/dL (ref 65–99)
GLUCOSE-CAPILLARY: 120 mg/dL — AB (ref 65–99)
GLUCOSE-CAPILLARY: 185 mg/dL — AB (ref 65–99)

## 2015-08-03 MED ORDER — MORPHINE SULFATE (PF) 2 MG/ML IV SOLN
INTRAVENOUS | Status: AC
  Administered 2015-08-03: 1 mg via INTRAVENOUS
  Filled 2015-08-03: qty 1

## 2015-08-03 MED ORDER — MORPHINE SULFATE (PF) 2 MG/ML IV SOLN
1.0000 mg | Freq: Once | INTRAVENOUS | Status: AC
  Administered 2015-08-03: 1 mg via INTRAVENOUS

## 2015-08-03 NOTE — Progress Notes (Signed)
Patient complaint of shortness of breath. On HFNC, oxygen saturation 93%. Ativan administered previously, MD notified and stated that he would see the patient. No new orders at this time

## 2015-08-03 NOTE — Progress Notes (Signed)
Patient heart rate in 130s. MD notified with no new orders at this time. States he will come see patient.

## 2015-08-03 NOTE — Progress Notes (Signed)
Approach by nursing staff patient with complaint of right shoulder pain and increased heart rate with mild increase in respiratory rate. RN stated patient was stable otherwise. At the time of RN addressing issue with M.D., M.D. was in a critically ill patient room with impending respiratory failure.

## 2015-08-03 NOTE — Progress Notes (Signed)
Patient is lethargic and oriented. Reporting pain x 1 and anxiety x 2 this shift, medicated with improvement. Remains on 97% HFNC with oxygen saturation WNL yet labile. Adequate output from foley catheter. Family/friends at bedside intermittently throughout afternoon.

## 2015-08-03 NOTE — Progress Notes (Signed)
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 -The patient has not responded to empiric steroids, she is currently being maintained on 60 mg of prednisone daily. She has completed a course of broad-spectrum antibiotics without any significant improvement. This makes it less likely that the patient's respiratory failure is due to pneumonia or reversible radiation pneumonitis. -The patient is currently on HFNC but will alternate with BiPaP, due to dyspnea and air hunger. -Lung cancer/stage III status post chemoradiation, finished 04/10/2015.  P:  -Given her continued lack of response to empiric therapy, now requiring nearly maximal amounts of high flow oxygen. We'll transition to comfort measures only, likely on Monday. -Continue empiric steroids and high flow nasal cannula for today, we'll likely transfer to the floor on Monday with nasal cannula oxygen as 6-10 L and unmonitored, sats. Plan at that time will be transferred to inpatient hospice. -We'll start IV morphine for air hunger. - stopped scheduled nebs due to rebound anxiety and bronchospams, xopenox PRN -Status post bronchoscopy with BAL on 11/23 - cultures negative to date - currently normal flora -Patient does have a mild anxiety component with her respiratory distress, 1 mg morphine IV every 6 hours for shortness of breath and chest pain 3 days   CARDIOVASCULAR -Continue to monitor hemodynamic status   RENAL -Monitor electrolytes -ICU to try protocol replacement  GASTROINTESTINAL SUP - PPI  HEMATOLOGIC History of breast  cancer Stage III left upper lobe lung cancer -Status post chemoradiation 04/10/2015. The patient also received chest wall radiation on the left side for breast cancer in 2015.  -Hematology/oncology following -Continue with monitoring of CBC   INFECTIOUS Bilateral pneumonia -Continue with current antibiotics, for a total of 10 days -BAL with normal flora   ENDOCRINE -ssi  NEUROLOGIC RASS goal: 0 ---------------------------------------   ----------------------------------------   Name: Brittney Lam MRN: [id number] DOB: [birth year]    ADMISSION DATE:  07/13/2015    CHIEF COMPLAINT. Dyspnea   Subjective Patient with a little bit more tachypnea today than normal, had a complain of some mild right shoulder pain, presented bedside. Given an extra dose of morphine.  Review of Systems:  The patient denies chest pain, PND, cough, expectoration. The remainder of the review systems was reviewed with this patient and found to be negative.    VITAL SIGNS: Temp:  [98.1 F (36.7 C)-99.1 F (37.3 C)] 98.1 F (36.7 C) (12/03 0800) Pulse Rate:  [93-131] 118 (12/03 0900) Resp:  [22-40] 28 (12/03 0900) BP: (84-123)/(53-89) 116/89 mmHg (12/03 0900) SpO2:  [89 %-100 %] 92 % (12/03 0900) FiO2 (%):  [95 %-99 %] 97 % (12/03 0900) HEMODYNAMICS:   VENTILATOR SETTINGS: Vent Mode:  [-]  FiO2 (%):  [95 %-99 %] 97 % INTAKE / OUTPUT:  Intake/Output Summary (Last 24 hours) at 08/03/15 1035 Last data filed at 08/03/15 0900  Gross per 24 hour  Intake    100 ml  Output    650 ml  Net   -550 ml    PHYSICAL EXAMINATION: Physical Examination:   VS: BP 116/89 mmHg  Pulse 118  Temp(Src) 98.1 F (36.7 C) (Oral)  Resp 28  Ht '5\' 2"'$  (1.575 m)  Wt 144  lb (65.318 kg)  BMI 26.33 kg/m2  SpO2 92%  General Appearance: No distress  Neuro:without focal findings, mental status normal. HEENT: PERRLA, EOM intact. Pulmonary: decreased breath sounds bilaterally Cardiovascular: Normal S1,S2.   No m/r/g.   Abdomen: Benign, Soft, non-tender. Renal:  No costovertebral tenderness  GU:  Not performed at this time. Endocrine: No evident thyromegaly. Skin:   warm, no rashes, no ecchymosis  Extremities: normal, no cyanosis, clubbing.    LABORATORY PANEL:   CBC  Recent Labs Lab 08/02/15 0640  WBC 28.3*  HGB 13.2  HCT 40.9  PLT 335    Chemistries   Recent Labs Lab 07/31/15 0508 08/02/15 0640 08/03/15 0518  NA 138 137 140  K 3.4* 3.7 4.2  CL 99* 98* 101  CO2 '30 29 30  '$ GLUCOSE 95 115* 95  BUN 25* 27* 33*  CREATININE 0.93 0.98 0.97  CALCIUM 10.0 10.0 10.3  MG  --  1.9  --   PHOS  --  3.5  --   AST 15  --   --   ALT 17  --   --   ALKPHOS 114  --   --   BILITOT 0.2*  --   --      Recent Labs Lab 08/01/15 2051 08/02/15 0707 08/02/15 1108 08/02/15 1610 08/02/15 2135 08/03/15 0706  GLUCAP 175* 117* 145* 147* 190* 81   No results for input(s): PHART, PCO2ART, PO2ART in the last 168 hours.  Recent Labs Lab 07/31/15 0508  AST 15  ALT 17  ALKPHOS 114  BILITOT 0.2*  ALBUMIN 2.9*    Cardiac Enzymes No results for input(s): TROPONINI in the last 168 hours.  RADIOLOGY:  No results found.     I have personally obtained a history, examined the patient, evaluated laboratory and imaging results, formulated the assessment and plan and placed orders. CRITICAL CARE: The patient is critically ill with multiple organ systems failure and requires high complexity decision making for assessment and support, frequent evaluation and titration of therapies, application of advanced monitoring technologies and extensive interpretation of multiple databases. Critical Care Time devoted to patient care services described in this note is 40 minutes.    Vilinda Boehringer, MD [facility] Pager [phone] (please enter 7-digits) On Call Pager - [phone] (please enter 7-digits)

## 2015-08-04 ENCOUNTER — Inpatient Hospital Stay: Payer: No Typology Code available for payment source

## 2015-08-04 DIAGNOSIS — C3492 Malignant neoplasm of unspecified part of left bronchus or lung: Secondary | ICD-10-CM | POA: Diagnosis not present

## 2015-08-04 DIAGNOSIS — J9601 Acute respiratory failure with hypoxia: Secondary | ICD-10-CM | POA: Diagnosis not present

## 2015-08-04 DIAGNOSIS — J701 Chronic and other pulmonary manifestations due to radiation: Secondary | ICD-10-CM | POA: Diagnosis not present

## 2015-08-04 LAB — GLUCOSE, CAPILLARY
GLUCOSE-CAPILLARY: 126 mg/dL — AB (ref 65–99)
GLUCOSE-CAPILLARY: 191 mg/dL — AB (ref 65–99)
Glucose-Capillary: 147 mg/dL — ABNORMAL HIGH (ref 65–99)
Glucose-Capillary: 188 mg/dL — ABNORMAL HIGH (ref 65–99)

## 2015-08-04 MED ORDER — SODIUM CHLORIDE 0.9 % IV BOLUS (SEPSIS)
250.0000 mL | Freq: Once | INTRAVENOUS | Status: AC
  Administered 2015-08-04: 250 mL via INTRAVENOUS

## 2015-08-04 MED ORDER — MORPHINE SULFATE (PF) 2 MG/ML IV SOLN
2.0000 mg | INTRAVENOUS | Status: DC | PRN
  Administered 2015-08-04: 1 mg via INTRAVENOUS
  Administered 2015-08-04 – 2015-08-05 (×3): 2 mg via INTRAVENOUS
  Filled 2015-08-04 (×4): qty 1

## 2015-08-04 NOTE — Progress Notes (Signed)
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 -The patient has not responded to empiric steroids, she is currently being maintained on 60 mg of prednisone daily. She has completed a course of broad-spectrum antibiotics without any significant improvement. This makes it less likely that the patient's respiratory failure is due to pneumonia or reversible radiation pneumonitis. -The patient is currently on HFNC but will alternate with BiPaP, due to dyspnea and air hunger. -Lung cancer/stage III status post chemoradiation, finished 04/10/2015.  P:  -Given her continued lack of response to empiric therapy, now requiring nearly maximal amounts of high flow oxygen. We'll transition to comfort measures only, likely on Monday. -Continue empiric steroids and high flow nasal cannula for today, we'll likely transfer to the floor on Monday with nasal cannula oxygen as 6-10 L and unmonitored, sats. Plan at that time will be transferred to inpatient hospice. -We'll start IV morphine for air hunger. - stopped scheduled nebs due to rebound anxiety and bronchospams, xopenox PRN -Status post bronchoscopy with BAL on 11/23 - cultures negative to date - currently normal flora -Patient does have a mild anxiety component with her respiratory distress, 1 mg morphine IV every 6 hours for shortness of breath and chest pain 3 days   CARDIOVASCULAR -Continue to monitor hemodynamic status   RENAL -Monitor electrolytes -ICU to try protocol replacement  GASTROINTESTINAL SUP - PPI  HEMATOLOGIC History of breast  cancer Stage III left upper lobe lung cancer -Status post chemoradiation 04/10/2015. The patient also received chest wall radiation on the left side for breast cancer in 2015.  -Hematology/oncology following -Continue with monitoring of CBC   INFECTIOUS Bilateral pneumonia -Continue with current antibiotics, for a total of 10 days -BAL with normal flora   ENDOCRINE -ssi  NEUROLOGIC RASS goal: 0 ---------------------------------------   ----------------------------------------   Name: Brittney Lam MRN: [id number] DOB: 1960-11-29    ADMISSION DATE:  07/11/2015    CHIEF COMPLAINT. Dyspnea   Subjective Patient states that her breathing is a little bit more labored this morning, her FiO2 is up to 97%, she is willing to try to BiPAP for 1-2 hours today. She previously refused BiPAP.  Review of Systems:  The patient denies chest pain, PND, cough, expectoration. The remainder of the review systems was reviewed with this patient and found to be negative.    VITAL SIGNS: Temp:  [97.6 F (36.4 C)-98.2 F (36.8 C)] 98.2 F (36.8 C) (12/04 0800) Pulse Rate:  [89-133] 133 (12/04 0800) Resp:  [21-34] 21 (12/04 0314) BP: (85-129)/(62-89) 129/74 mmHg (12/04 0800) SpO2:  [88 %-95 %] 92 % (12/04 0700) FiO2 (%):  [95 %-97 %] 95 % (12/04 0314) HEMODYNAMICS:   VENTILATOR SETTINGS: Vent Mode:  [-]  FiO2 (%):  [95 %-97 %] 95 % INTAKE / OUTPUT:  Intake/Output Summary (Last 24 hours) at 08/04/15 0817 Last data filed at 08/04/15 0600  Gross per 24 hour  Intake    150 ml  Output    800 ml  Net   -650 ml    PHYSICAL EXAMINATION: Physical Examination:   VS: BP 129/74 mmHg  Pulse 133  Temp(Src) 98.2 F (36.8 C) (Axillary)  Resp 21  Ht  $'5\' 2"'h$  (1.575 m)  Wt 144 lb (65.318 kg)  BMI 26.33 kg/m2  SpO2 92%  General Appearance: No distress  Neuro:without focal findings, mental status normal. HEENT: PERRLA, EOM intact. Pulmonary: decreased breath sounds bilaterally, mild  upper lobe diffuse wheezing noted Cardiovascular: Normal S1,S2.  No m/r/g.   Abdomen: Benign, Soft, non-tender. Renal:  No costovertebral tenderness  GU:  Not performed at this time. Endocrine: No evident thyromegaly. Skin:   warm, no rashes, no ecchymosis  Extremities: normal, no cyanosis, clubbing.    LABORATORY PANEL:   CBC  Recent Labs Lab 08/02/15 0640  WBC 28.3*  HGB 13.2  HCT 40.9  PLT 335    Chemistries   Recent Labs Lab 07/31/15 0508 08/02/15 0640 08/03/15 0518  NA 138 137 140  K 3.4* 3.7 4.2  CL 99* 98* 101  CO2 '30 29 30  '$ GLUCOSE 95 115* 95  BUN 25* 27* 33*  CREATININE 0.93 0.98 0.97  CALCIUM 10.0 10.0 10.3  MG  --  1.9  --   PHOS  --  3.5  --   AST 15  --   --   ALT 17  --   --   ALKPHOS 114  --   --   BILITOT 0.2*  --   --      Recent Labs Lab 08/02/15 1610 08/02/15 2135 08/03/15 0706 08/03/15 1124 08/03/15 2132 08/04/15 0726  GLUCAP 147* 190* 81 120* 185* 126*   No results for input(s): PHART, PCO2ART, PO2ART in the last 168 hours.  Recent Labs Lab 07/31/15 0508  AST 15  ALT 17  ALKPHOS 114  BILITOT 0.2*  ALBUMIN 2.9*    Cardiac Enzymes No results for input(s): TROPONINI in the last 168 hours.  RADIOLOGY:  No results found.     I have personally obtained a history, examined the patient, evaluated laboratory and imaging results, formulated the assessment and plan and placed orders. CRITICAL CARE: The patient is critically ill with multiple organ systems failure and requires high complexity decision making for assessment and support, frequent evaluation and titration of therapies, application of advanced monitoring technologies and extensive interpretation of multiple databases. Critical Care Time devoted to patient care services described in this note is 40 minutes.    Vilinda Boehringer, MD [facility] Pager [phone] (please enter 7-digits) On Call Pager - [phone] (please enter  7-digits)

## 2015-08-04 NOTE — Progress Notes (Signed)
Pt has removed Bipap mask and refuses to place back on. RN at bedside. Placed pt on HFNC  SPO2 88-92%, RR 27, HR 106.

## 2015-08-04 NOTE — Progress Notes (Signed)
HR has improved since given fluid bolus from 135 - 119, BP stable at 126/91, RR remains labored at 31-33, 02 sats 93%. Has been in [place] most of day, has not eaten anything. Family at bedide most of day and updated. Gell applied to bridge of nose due to presence of Bipap. Foley remains in place as pt has no reserve and is exhausted by any movement and could not tolerate bedpans at this time as desats very quickly into 70s

## 2015-08-04 NOTE — Progress Notes (Signed)
Pt has been tachycardic all morning with HR in 125-130s range, denies pain or fever, resting well on Bipap. MD aware and new orders noted

## 2015-08-05 DIAGNOSIS — C3492 Malignant neoplasm of unspecified part of left bronchus or lung: Secondary | ICD-10-CM | POA: Diagnosis not present

## 2015-08-05 DIAGNOSIS — C3431 Malignant neoplasm of lower lobe, right bronchus or lung: Secondary | ICD-10-CM | POA: Diagnosis not present

## 2015-08-05 DIAGNOSIS — J700 Acute pulmonary manifestations due to radiation: Secondary | ICD-10-CM | POA: Diagnosis not present

## 2015-08-05 DIAGNOSIS — J701 Chronic and other pulmonary manifestations due to radiation: Secondary | ICD-10-CM | POA: Diagnosis not present

## 2015-08-05 DIAGNOSIS — C3412 Malignant neoplasm of upper lobe, left bronchus or lung: Secondary | ICD-10-CM | POA: Diagnosis not present

## 2015-08-05 DIAGNOSIS — J9600 Acute respiratory failure, unspecified whether with hypoxia or hypercapnia: Secondary | ICD-10-CM | POA: Diagnosis not present

## 2015-08-05 DIAGNOSIS — J189 Pneumonia, unspecified organism: Secondary | ICD-10-CM | POA: Diagnosis not present

## 2015-08-05 DIAGNOSIS — J9601 Acute respiratory failure with hypoxia: Secondary | ICD-10-CM | POA: Diagnosis not present

## 2015-08-05 LAB — GLUCOSE, CAPILLARY: Glucose-Capillary: 167 mg/dL — ABNORMAL HIGH (ref 65–99)

## 2015-08-05 MED ORDER — LORAZEPAM 0.5 MG PO TABS
0.5000 mg | ORAL_TABLET | Freq: Two times a day (BID) | ORAL | Status: DC
  Administered 2015-08-05 – 2015-08-06 (×2): 0.5 mg via ORAL
  Filled 2015-08-05 (×2): qty 1

## 2015-08-05 MED ORDER — MORPHINE SULFATE (PF) 2 MG/ML IV SOLN
2.0000 mg | INTRAVENOUS | Status: DC | PRN
  Administered 2015-08-05 – 2015-08-06 (×6): 2 mg via INTRAVENOUS
  Filled 2015-08-05 (×6): qty 1

## 2015-08-05 NOTE — Progress Notes (Signed)
   08/05/15 0900  Clinical Encounter Type  Visited With Family;Patient and family together  Visit Type Follow-up;Spiritual support  Referral From Nurse  Consult/Referral To Chaplain  Spiritual Encounters  Spiritual Needs Emotional  Stress Factors  Patient Stress Factors Health changes;Major life changes  Family Stress Factors Health changes;Major life changes  Chaplain rounded in the unit and offered a compassionate presence to patient and family.  Chaplain Cheronda Erck A. Iyanah Demont Ext. [phone]

## 2015-08-05 NOTE — Progress Notes (Signed)
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 -The patient has not responded to empiric steroids, she is currently being maintained on 60 mg of prednisone daily. She has completed a course of broad-spectrum antibiotics without any significant improvement. This makes it less likely that the patient's respiratory failure is due to pneumonia or reversible radiation pneumonitis. -Lung cancer/stage III status post chemoradiation, finished 04/10/2015.  P:  -Given her continued lack of response to empiric therapy, now requiring nearly maximal amounts of high flow oxygen. We'll transition to comfort measures only, likely on Monday. -Case was discussed with the patient, and with family at bedside, oncology, critical care RN, nutrition, respiratory therapy. We will plan to transition the patient to comfort measures only. We will plan to transfer the patient the floor with high flow nasal cannula oxygen. We will use IV morphine as needed for comfort. - xopenox PRN    CARDIOVASCULAR -Continue to monitor hemodynamic status   RENAL -Monitor electrolytes -ICU to try protocol replacement  GASTROINTESTINAL SUP - PPI  HEMATOLOGIC History of breast cancer Stage III left upper lobe lung cancer -Status post chemoradiation 04/10/2015. The patient also received chest wall radiation on the left side for breast cancer in 2015.  -Hematology/oncology following    INFECTIOUS Bilateral pneumonia -Continue with current antibiotics, for a total of 10 days -BAL with normal  flora   ENDOCRINE -ssi  NEUROLOGIC RASS goal: 0 ---------------------------------------   ----------------------------------------   Name: Brittney Lam MRN: [id number] DOB: June 01, 1961    ADMISSION DATE:  07/05/2015    CHIEF COMPLAINT. Dyspnea   Subjective Patient states that her breathing is a little bit more labored this morning, her FiO2 is up to 97%,  Review of Systems:  The patient denies chest pain, PND, cough, expectoration. The remainder of the review systems was reviewed with this patient and found to be negative.    VITAL SIGNS: Temp:  [98.4 F (36.9 C)-98.7 F (37.1 C)] 98.6 F (37 C) (12/05 0800) Pulse Rate:  [82-122] 114 (12/05 0900) Resp:  [16-34] 25 (12/05 0900) BP: (92-126)/(68-91) 123/90 mmHg (12/05 0900) SpO2:  [89 %-98 %] 94 % (12/05 1135) FiO2 (%):  [95 %] 95 % (12/05 1135) HEMODYNAMICS:   VENTILATOR SETTINGS: Vent Mode:  [-]  FiO2 (%):  [95 %] 95 % INTAKE / OUTPUT:  Intake/Output Summary (Last 24 hours) at 08/05/15 1319 Last data filed at 08/05/15 0900  Gross per 24 hour  Intake     50 ml  Output    450 ml  Net   -400 ml    PHYSICAL EXAMINATION: Physical Examination:   VS: BP 123/90 mmHg  Pulse 114  Temp(Src) 98.6 F (37 C) (Axillary)  Resp 25  Ht '5\' 2"'$  (1.575 m)  Wt 65.318 kg (144 lb)  BMI 26.33 kg/m2  SpO2 94%  General Appearance: No distress  Neuro:without focal findings, mental status normal. HEENT: PERRLA, EOM intact. Pulmonary: decreased breath sounds bilaterally, mild upper lobe diffuse wheezing noted Cardiovascular: Normal S1,S2.  No m/r/g.   Abdomen: Benign, Soft, non-tender. Renal:  No costovertebral tenderness  GU:  Not performed at this time. Endocrine: No evident thyromegaly. Skin:   warm, no rashes, no  ecchymosis  Extremities: normal, no cyanosis, clubbing.    LABORATORY PANEL:   CBC  Recent Labs Lab 08/02/15 0640  WBC 28.3*  HGB 13.2  HCT 40.9  PLT 335    Chemistries   Recent  Labs Lab 07/31/15 0508 08/02/15 0640 08/03/15 0518  NA 138 137 140  K 3.4* 3.7 4.2  CL 99* 98* 101  CO2 '30 29 30  '$ GLUCOSE 95 115* 95  BUN 25* 27* 33*  CREATININE 0.93 0.98 0.97  CALCIUM 10.0 10.0 10.3  MG  --  1.9  --   PHOS  --  3.5  --   AST 15  --   --   ALT 17  --   --   ALKPHOS 114  --   --   BILITOT 0.2*  --   --      Recent Labs Lab 08/03/15 2132 08/04/15 0726 08/04/15 1054 08/04/15 1610 08/04/15 2145 08/05/15 0742  GLUCAP 185* 126* 147* 191* 188* 167*   No results for input(s): PHART, PCO2ART, PO2ART in the last 168 hours.  Recent Labs Lab 07/31/15 0508  AST 15  ALT 17  ALKPHOS 114  BILITOT 0.2*  ALBUMIN 2.9*    Cardiac Enzymes No results for input(s): TROPONINI in the last 168 hours.  RADIOLOGY:  Dg Chest Port 1 View  08/04/2015  CLINICAL DATA:  Known respiratory failure. EXAM: PORTABLE CHEST 1 VIEW COMPARISON:  Chest x-rays dated 07/31/2015 and 07/26/2015. FINDINGS: Cardiomediastinal silhouette is stable in size and configuration. Large dense airspace opacity within the left upper lung is unchanged. Milder vague airspace opacity within the lateral aspects of the right lung is unchanged. No new lung findings. No large pleural effusion. Left chest wall Port-A-Cath is stable in position with tip in the lower SVC. Osseous and soft tissue structures about the chest are otherwise unremarkable. IMPRESSION: Stable chest x-ray. Stable dense airspace opacity within the left lung. Smaller vague airspace opacity within the lateral aspects of the right lung is also stable. These consolidations described as pneumonia versus tumor infiltration on CT of 07/18/2015, therefore, continued follow-up recommended to ensure resolution. Electronically Signed   By: Franki Cabot M.D.   On: 08/04/2015 09:51       Deep Ashby Dawes, M.D.   Critical Care Attestation.  I have personally obtained a history, examined the patient, evaluated laboratory and imaging results,  formulated the assessment and plan and placed orders. The Patient requires high complexity decision making for assessment and support, frequent evaluation and titration of therapies, application of advanced monitoring technologies and extensive interpretation of multiple databases. The patient has critical illness that could lead imminently to failure of 1 or more organ systems and requires the highest level of physician preparedness to intervene.  Critical Care Time devoted to patient care services described in this note is 35 minutes and is exclusive of time spent in procedures.

## 2015-08-05 NOTE — Progress Notes (Signed)
Discussed patient status and plan of care with team in rounds. Had been discussed previously  to transfer patient to 1C on 6-10L n/c. At this time, will leave patient in unit on HFNC at 95%, as family members will be coming in today and tomorrow, and with patient transferred on n/c will likely quickly decline. Patient is not comfort care at this time, but per Dr. Ashby Dawes, will not do any "finger sticks" or lab draws on patient. Will discontinue CBC and Creatinine scheduled for 08/06/15 and labs( BMP, CBC with diff, and CBC) from this morning. Will Discontinue CPG q 4 hours. Will discontinue scheduled  hydrochlorothiazide, potassium, nystatin, calcium, and famotidine. Will increase scheduled morphine to q 2 hours PRN for air hunger and comfort. This are all verbal orders discussed in rounds with Dr. Ashby Dawes.

## 2015-08-05 NOTE — Progress Notes (Signed)
Brief Nutrition Note:   Discussed poc during ICU rounds with MD Ashby Dawes. Pt remains on 95% HFNC; Plan is to transfer pt to 1C on 6-10L [place] but waiting until later today or tomorrow, until family members able to visit as pt will likely decline quickly after taken off HFNC per MD. Pt is not comfort care but no aggressive interventions at this time, pt is DNR, focus is on comfort. Pt is on Regular diet but not eating/drinking at this time. No nutritional supplements per pt request; no further recommendations at this time, will sign off due to current poc. Please re-consult RD if further assistance is needed.   Brittney Lam [place], [place], LDN [phone] Pager

## 2015-08-05 NOTE — Progress Notes (Signed)
Port returned no blood. Elink notified, hold labs.

## 2015-08-05 NOTE — Progress Notes (Signed)
[place] @ [facility] Telephone:([phone]) [phone]  Fax:([phone]) [place]: [birth year]  MR#: [id number]  VPX#:[id number]  Patient Care Team: [name], MD as PCP - General (Family Medicine) [name], MD (General Surgery) [name], RN as [place] Management [name], MD as Referring Physician (Family Medicine)  CHIEF COMPLAINT:  No chief complaint on file.  Oncology History   35. 54 year old female status post recent left breast cancer in 2005  2. Right breast cancer, locally advanced stage IIIa (pyT3, N1, M0), invasive mammary carcinoma with mucin production. Status post right modified radical mastectomy for 8 cm lesion with one of 4 sentinel lymph nodes positive for metastatic disease. Tumor is ER positive PR negative HER-2/neu not overexpressed. 3. Completed neoadjuvant chemotherapy with chest wall radiation therapy,  Finished on October 21, 2013. 4. Started letrazole 2.5 mg by mouth daily from October 24, 2013. 5. Carcinoma of lung.  Right upper lobe, status post right upper lobectomy, [id number]. 6.  Left upper lobe mass with mediastinal lymph node biopsies positive for poorly differentiated adenocarcinoma T1 N1 M0 tumor III Starting chemoradiation therapy in July of 2016 (second primary) Patient had EUS in June of 2016.  Bilateral hilar adenopathy which has been biopsied was positive for metastatic adenocarcinoma consistent with lung primary Starting chemoradiation therapy in July of 2016 7.  Has finished 6 cycles of chemotherapy with carboplatinum and Taxol concurrent with radiation therapy on April 10, 2015       INTERVAL HISTORY:  54 year old African-American lady with a history of multiple malignancies admitted in hospital with acute respiratory failure.  Bilateral upper lobe infiltrates and suspected radiation pneumonitis versus progressing cancer versus infection Patient continues to be very apprehensive  emotional crying family by the side of bed. Patient is currently transferred to the regular floor.  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 Family with daughter is present. Patient is not in any distress.  Review of systems  general status: Patient is feeling weak and tired.  No change in a performance status.  No chills.  No fever. HEENT: Difficulty swallowing. Lungs: Increasing shortness of breath.  Cough.  No hemoptysis.  Oxygen saturation dropped to 81%. Cardiac: No chest pain or paroxysmal nocturnal dyspnea GI: No nausea no vomiting no diarrhea no abdominal pain Skin: No rash Lower extremity no swelling Left upper extremity is swelling.  Has improved Neurological system: No tingling.  No numbness.  No other focal signs Musculoskeletal system no bony pains    PAST MEDICAL HISTORY: Past Medical History  Diagnosis Date  . Unspecified essential hypertension   . Kidney failure   . Personal history of tobacco use, presenting hazards to health   . Cataract   . Personal history of malignant neoplasm of breast   . Breast screening, unspecified   . Special screening for malignant neoplasms, colon   . Diabetes mellitus without complication ([place])   . MDRO (multiple drug resistant organisms) resistance   . H. pylori duodenitis   . Complication of anesthesia     BP DROPPED DURING LOBECTOMY IN 2014  . Cancer [facility]) 2001    left breast cancer s/p L/SN/R in 2001  . Cancer [facility]) 2014    right breast invasive CA, Er pos,Pr neg, Her 2 neg.  . Lung cancer, upper lobe ([place]) 2014    right upper lobe  . Breast cancer ([place])     PAST SURGICAL HISTORY: Past Surgical History  Procedure  Laterality Date  . Intercostal nerve block  2005  . Cataract extraction w/ intraocular lens implant  2005  . Carpal tunnel release Right 2011  . Insertion central venous access device w/ subcutaneous port  12-07-12  . Breast lumpectomy Left 2001  . Breast surgery Right 2014    total mastectomy  .  Right lung upper lobectomy  03/2013  . Lobectomy Right   . Cataract extraction    . Mastectomy Right   . Colonoscopy    . Endobronchial ultrasound N/A 02/05/2015    Procedure: ENDOBRONCHIAL ULTRASOUND;  Surgeon: Flora Lipps, MD;  Location: [facility] ORS;  Service: Cardiopulmonary;  Laterality: N/A;  . Flexible bronchoscopy N/A 07/09/2015    Procedure: FLEXIBLE BRONCHOSCOPY;  Surgeon: Allyne Gee, MD;  Location: [facility] ORS;  Service: Pulmonary;  Laterality: N/A;    FAMILY HISTORY Family History  Problem Relation Age of Onset  . Diabetes Other   . Hyperlipidemia Other   . Hypertension Other         ADVANCED DIRECTIVES: Patient does not have any advanced healthcare directive. Information has been given.   HEALTH MAINTENANCE: Social History  Substance Use Topics  . Smoking status: Former Smoker -- 0.50 packs/day for 30 years    Types: Cigarettes    Quit date: 02/03/2013  . Smokeless tobacco: Never Used     Comment: using 4m nicorette gum- 1/2 piece, 10 times per day  . Alcohol Use: No      Allergies  Allergen Reactions  . Metformin Diarrhea and Nausea Only  . Other Rash    Sage Wipes    Current Facility-Administered Medications  Medication Dose Route Frequency Provider Last Rate Last Dose  . acetaminophen (TYLENOL) tablet 650 mg  650 mg Oral Q4H PRN LEvlyn Kanner NP   650 mg at 07/30/15 1456  . ALPRAZolam (Duanne Moron tablet 0.5 mg  0.5 mg Oral Q6H PRN PLaverle Hobby MD   0.5 mg at 08/05/15 0850  . butamben-tetracaine-benzocaine (CETACAINE) spray 1 spray  1 spray Topical Once SAllyne Gee MD      . citalopram (CELEXA) tablet 20 mg  20 mg Oral Daily JForest Gleason MD   20 mg at 08/05/15 1313  . guaiFENesin-dextromethorphan (ROBITUSSIN DM) 100-10 MG/5ML syrup 10 mL  10 mL Oral Q4H PRN LEvlyn Kanner NP   10 mL at 07/31/15 1226  . levalbuterol (XOPENEX) nebulizer solution 1.25 mg  1.25 mg Nebulization Q4H PRN Vishal Mungal, MD   1.25 mg at 08/04/15 1311  . lidocaine  (XYLOCAINE) 2 % jelly 1 application  1 application Topical Once SAllyne Gee MD      . LORazepam (ATIVAN) injection 2 mg  2 mg Intravenous Q6H PRN PLaverle Hobby MD   2 mg at 08/03/15 1621  . morphine 2 MG/ML injection 2 mg  2 mg Intravenous Q2H PRN PLaverle Hobby MD   2 mg at 08/05/15 1313  . nystatin (MYCOSTATIN) 100000 UNIT/ML suspension 500,000 Units  5 mL Mouth/Throat QID MLequita Asal MD   500,000 Units at 08/03/15 2240  . PARoxetine (PAXIL) tablet 10 mg  10 mg Oral QHS LEvlyn Kanner NP   10 mg at 08/03/15 2240  . phenylephrine (NEO-SYNEPHRINE) 0.25 % nasal spray 1 spray  1 spray Each Nare Q6H PRN SAllyne Gee MD      . predniSONE (DELTASONE) tablet 60 mg  60 mg Oral Q breakfast PLaverle Hobby MD   60 mg at 08/05/15 1313  . senna-docusate (Senokot-S) tablet  1 tablet  1 tablet Oral QHS PRN Evlyn Kanner, NP       Facility-Administered Medications Ordered in Other Encounters  Medication Dose Route Frequency Provider Last Rate Last Dose  . sodium chloride 0.9 % injection 10 mL  10 mL Intracatheter PRN Forest Gleason, MD   10 mL at 02/04/15 1027    OBJECTIVE:  Filed Vitals:   08/05/15 0800 08/05/15 0900  BP: 105/82 123/90  Pulse: 105 114  Temp: 98.6 F (37 C)   Resp: 26 25     Body mass index is 26.33 kg/(m^2).    ECOG FS:0 - Asymptomatic  PHYSICAL EXAM: GENERAL:  Performance status is declining Patient is in mild distress MENTAL STATUS:  Alert and oriented to person, place and time. HEAD:   Normocephalic, atraumatic, face symmetric, no Cushingoid features. EYES:    Pupils equal round and reactive to light and accomodation.  No conjunctivitis or scleral icterus. ENT:  Oropharynx clear without lesion.  Tongue normal. Mucous membranes moist.  RESPIRATORY wheezing and rhonchi on both sides CARDIOVASCULAR:  Regular rate and rhythm without murmur, rub or gallop. BREAST:  Right breast : Status post mastectomy.  No evidence of recurrent disease skin  changes or nipple discharge.  Left breast without masses, skin changes or nipple discharge. ABDOMEN:  Soft, non-tender, with active bowel sounds, and no hepatosplenomegaly.  No masses. BACK:  No CVA tenderness.  No tenderness on percussion of the back or rib cage. SKIN:  No rashes, ulcers or lesions. EXTREMITIES: No edema, no skin discoloration or tenderness.  No palpable cords. Swelling of the left upper extremity LYMPH NODES: No palpable cervical, supraclavicular, axillary or inguinal adenopathy  NEUROLOGICAL: Unremarkable. PSYCH:  Appropriate.   LAB RESULTS:  Admission on 07/03/2015  No results displayed because visit has over 200 results.      Lab Results  Component Value Date   LABCA2 20.3 06/11/2014     STUDIES: Dg Chest 1 View  07/31/2015  CLINICAL DATA:  54 year old female with shortness of Breath. Previous right upper lobectomy for lung cancer in 2014. Metastatic disease, left lung mass, mediastinal lymphadenopathy, and pleural based lesion. Initial encounter. EXAM: CHEST 1 VIEW COMPARISON:  07/30/2015 and earlier. FINDINGS: Portable AP upright view at 0549 hours. Since 07/22/2015 confluent left upper lobe airspace disease has slightly regressed. Stable lung volumes. Stable cardiac size and mediastinal contours. Left chest porta cath remains in place and is accessed. Right upper lobe airspace disease has significantly regressed since 07/18/2015. No pneumothorax, pleural effusion or pulmonary edema. IMPRESSION: 1. Slightly improved dense airspace disease in the left upper lobe, and significantly improved right upper lobe, since 07/18/2015. 2. No new cardiopulmonary abnormality identified. Electronically Signed   By: Genevie Ann M.D.   On: 07/31/2015 07:18   Dg Chest 1 View  07/30/2015  CLINICAL DATA:  Chest pain. EXAM: CHEST 1 VIEW COMPARISON:  07/26/2015. FINDINGS: Port-A-Cath noted with tip in stable position. Heart size stable. Persistent bilateral pulmonary infiltrates,  particular prominent left upper lobe, no interim change. No pleural effusion or pneumothorax. No acute bony abnormality. IMPRESSION: 1. Port-A-Cath in stable position. 2. Persistent bilateral pulmonary infiltrates, particular prominent left upper lobe. No interim change. Electronically Signed   By: Marcello Moores  Register   On: 07/30/2015 07:46   Dg Chest 1 View  07/22/2015  CLINICAL DATA:  Recurrent left upper lobe lung cancer for which the patient is currently being treated, presenting with acutely worsening shortness of breath, cough and chest congestion. EXAM: Portable CHEST  1 VIEW COMPARISON:  CT chest 07/18/2015 and earlier.  PET-CT 06/14/2015. FINDINGS: Suboptimal inspiration. Cardiac silhouette upper normal in size. Dense airspace consolidation in the left upper lobe with air bronchograms, unchanged since the CT 4 days ago. Improved aeration in the right upper lobe, though patchy airspace opacities persist. Consolidation medially in the left lower lobe, unchanged. Small bilateral pleural effusions, unchanged. No new pulmonary parenchymal abnormalities. Left subclavian Port-A-Cath tip projects at or near the cavoatrial junction. IMPRESSION: Since the CT chest 4 days ago: 1. Improved aeration in the right upper lobe, though patchy airspace opacities persist indicating residual pneumonia. 2. 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 3. No change in the consolidation in the medial right lower lobe, likely post radiation pneumonitis. 4. No new abnormalities. Electronically Signed   By: Evangeline Dakin M.D.   On: 07/22/2015 19:33   Ct Angio Chest Pe W/cm &/or Wo Cm  07/18/2015  CLINICAL DATA:  Short of breath for several weeks EXAM: CT ANGIOGRAPHY CHEST WITH CONTRAST TECHNIQUE: Multidetector CT imaging of the chest was performed using the standard protocol during bolus administration of intravenous contrast.  Multiplanar CT image reconstructions and MIPs were obtained to evaluate the vascular anatomy. CONTRAST:  187m OMNIPAQUE IOHEXOL 350 MG/ML SOLN COMPARISON:  PET-CT 06/14/2015 FINDINGS: There are no filling defects in the pulmonary arterial tree to suggest acute pulmonary thromboembolism. Mediastinal adenopathy is not significantly changed since the PET-CT from 1 month ago. Left subclavian Port-A-Cath is stable. Consolidation within the apical left upper lobe and superior segment of the right lower lobe has markedly worsened. Very small bilateral pleural effusions have developed. No pneumothorax. The 1.4 cm left upper lobe lung mass on image 43 of series 8 is stable. Postoperative changes from right mastectomy are again noted. Bilateral rib fractures again noted. Right rib fractures are noticeably displaced. There are sclerotic areas in upper left antral lateral ribs without fractures which may represent rib lesions. There are also fractures and left mid to lower anterior ribs with some evidence of healing. These are all not significantly changed. Review of the MIP images confirms the above findings. IMPRESSION: No evidence of acute pulmonary thromboembolism Consolidation in the left upper lobe and superior segment of the right lower lobe has worsened. This may represent pneumonia, pneumonitis, or infiltration with tumor. New very small pleural effusions. Stable mediastinal adenopathy. Bilateral of rib fractures are again noted and are not significantly changed. They may represent pathologic fractures. Electronically Signed   By: AMarybelle KillingsM.D.   On: 07/18/2015 11:02   UKoreaVenous Img Upper Uni Left  07/25/2015  CLINICAL DATA:  Left upper extremity swelling x1 month. Diabetes. Left subclavian surgically placed port catheter. EXAM: LEFT UPPER EXTREMITY VENOUS DOPPLER ULTRASOUND TECHNIQUE: Gray-scale sonography with graded compression, as well as color Doppler and duplex ultrasound were performed to evaluate the  upper extremity deep venous system from the level of the subclavian vein and including the jugular, axillary, basilic and upper cephalic vein. Spectral Doppler was utilized to evaluate flow at rest and with distal augmentation maneuvers. COMPARISON:  CT 07/18/2015 and previous FINDINGS: Thrombus within deep veins: Noncompressible thrombus in the axillary vein over short segment. Brachial, radial, ulnar, basilic, and cephalic veins remain patent and compressible, with somewhat monophasic waveforms probably related to the central obstruction. There is a monophasic antegrade waveform in the visualized subclavian vein, without evidence of transmitted right atrial pulsations. Other findings: Images of  contralateral subclavian vein are unremarkable. IMPRESSION: 1. Isolated occlusive left axillary  DVT. Electronically Signed   By: Lucrezia Europe M.D.   On: 07/06/2015 16:44   Dg Chest Port 1 View  08/04/2015  CLINICAL DATA:  Known respiratory failure. EXAM: PORTABLE CHEST 1 VIEW COMPARISON:  Chest x-rays dated 07/31/2015 and 07/26/2015. FINDINGS: Cardiomediastinal silhouette is stable in size and configuration. Large dense airspace opacity within the left upper lung is unchanged. Milder vague airspace opacity within the lateral aspects of the right lung is unchanged. No new lung findings. No large pleural effusion. Left chest wall Port-A-Cath is stable in position with tip in the lower SVC. Osseous and soft tissue structures about the chest are otherwise unremarkable. IMPRESSION: Stable chest x-ray. Stable dense airspace opacity within the left lung. Smaller vague airspace opacity within the lateral aspects of the right lung is also stable. These consolidations described as pneumonia versus tumor infiltration on CT of 07/18/2015, therefore, continued follow-up recommended to ensure resolution. Electronically Signed   By: Franki Cabot M.D.   On: 08/04/2015 09:51   Dg Chest Port 1 View  07/26/2015  CLINICAL DATA:   Respiratory failure. History of lung and breast cancer. EXAM: PORTABLE CHEST 1 VIEW COMPARISON:  07/22/2015 FINDINGS: Left-sided injectable central venous catheter is stable. Cardiomediastinal silhouette is normal. Mediastinal contours appear intact. There is no evidence of pneumothorax. Dense left upper lobe airspace consolidation/pulmonary mass is stable in appearance. There also remains a patchy airspace consolidation in the right upper lobe. No definite evidence of pleural effusion. Osseous structures are without acute abnormality. There has been a prior right mastectomy. IMPRESSION: No significant change in the appearance of the lungs with dense left upper lobe airspace consolidation and/or pulmonary mass, and patchy airspace consolidation in the right upper lobe. Electronically Signed   By: Fidela Salisbury M.D.   On: 07/26/2015 09:31    ASSESSMENT: 1.  Patient has a history of lung cancer bilateral recently had stage III disease status post radiation chemotherapy now admitted with acute on chronic respiratory failure or suspected secondary to radiation pneumonitis On steroid and on   Empirical  antibiotics. On high flow oxygen Negative bronchus for any malignancy and cultures are negative Probably would need transthoracic biopsy or percutaneous CT-guided biopsy to rule out malignancy  Discussed situation with pulmonologist and it appears that patient might be on were just getting intubated because of requirement of   100% oxygen.  Left upper extremity swelling is improved 2.  Acute on chronic respiratory failure with hypoxia with did not improve significantly with steroid and IV antibiotics  Repeat CT scan of the chest tomorrow  Overall poor prognosis being discussed with the family. Possibility of comfort care is being discussed with the family. I would continue steroid add Xanax Continue morphine. Patient is limited: Does not want any cardiopulmonary resuscitation or  intubation\patient does not want to be transferred to ICU again  Will get palliative care physician to continue to talk with the patient and family regarding comfort care issue I will also discuss with hospitalist to manage care  Malignant neoplasm of female breast   Staging form: Breast, AJCC 7th Edition     Clinical: Stage IIIA (T3, N1, M0) - Signed by Forest Gleason, MD on 01/26/2015 Malignant neoplasm of upper lobe, bronchus or lung   Staging form: Lung, AJCC 7th Edition     Clinical: Stage IA (T1b, N0, M0) - Signed by Forest Gleason, MD on 01/26/2015   Forest Gleason, MD  08/05/2015 1:34 PM

## 2015-08-05 NOTE — Progress Notes (Signed)
Paged Dr. Jeb Levering, MD would like patient moved to 1C, as comfort care, on HFNC. Transfer order entered.

## 2015-08-05 NOTE — Consult Note (Signed)
[facility] [place] NAME: Brittney Lam    MR#:  [id number]  DATE OF BIRTH:  Jan 11, 1961  DATE OF ADMISSION:  07/30/2015  PRIMARY CARE PHYSICIAN: [name], MD   REQUESTING/REFERRING PHYSICIAN: Dr. Oliva Bustard  CHIEF COMPLAINT:  No chief complaint on file.   HISTORY OF PRESENT ILLNESS: Brittney Lam  is a 54 y.o. female with a known history of diabetes, breast cancer, lung cancer, and status post radiation admitted to ICU for respiratory failure and has been on very high oxygen requirement, she actually had been on IV steroid and she finished broad-spectrum antibiotic course without much improvement. Overall family is leaning towards comfort care and so critical care team is signing off and general medical consult is called in for further management of this patient. Patient is in ICU on high flow nasal cannula but the plan is to transfer her to medical floor with continued high flow nasal cannula and once more family members arrive in a day or 2B palliative care meeting and she will be completely converted to comfort care as per the ICU nurse report. Currently no family members are present in the room. Patient denies any discomfort.  PAST MEDICAL HISTORY:   Past Medical History  Diagnosis Date  . Unspecified essential hypertension   . Kidney failure   . Personal history of tobacco use, presenting hazards to health   . Cataract   . Personal history of malignant neoplasm of breast   . Breast screening, unspecified   . Special screening for malignant neoplasms, colon   . Diabetes mellitus without complication ([place])   . MDRO (multiple drug resistant organisms) resistance   . H. pylori duodenitis   . Complication of anesthesia     BP DROPPED DURING LOBECTOMY IN 2014  . Cancer [facility]) 2001    left breast cancer s/p L/SN/R in 2001  . Cancer [facility]) 2014    right breast invasive CA, Er pos,Pr neg, Her 2 neg.  . Lung cancer, upper lobe ([place])  2014    right upper lobe  . Breast cancer ([place])     PAST SURGICAL HISTORY:  Past Surgical History  Procedure Laterality Date  . Intercostal nerve block  2005  . Cataract extraction w/ intraocular lens implant  2005  . Carpal tunnel release Right 2011  . Insertion central venous access device w/ subcutaneous port  12-07-12  . Breast lumpectomy Left 2001  . Breast surgery Right 2014    total mastectomy  . Right lung upper lobectomy  03/2013  . Lobectomy Right   . Cataract extraction    . Mastectomy Right   . Colonoscopy    . Endobronchial ultrasound N/A 02/05/2015    Procedure: ENDOBRONCHIAL ULTRASOUND;  Surgeon: Flora Lipps, MD;  Location: [facility] ORS;  Service: Cardiopulmonary;  Laterality: N/A;  . Flexible bronchoscopy N/A 07/18/2015    Procedure: FLEXIBLE BRONCHOSCOPY;  Surgeon: Allyne Gee, MD;  Location: [facility] ORS;  Service: Pulmonary;  Laterality: N/A;    SOCIAL HISTORY:  Social History  Substance Use Topics  . Smoking status: Former Smoker -- 0.50 packs/day for 30 years    Types: Cigarettes    Quit date: 02/03/2013  . Smokeless tobacco: Never Used     Comment: using '2mg'$  nicorette gum- 1/2 piece, 10 times per day  . Alcohol Use: No    FAMILY HISTORY:  Family History  Problem Relation Age of Onset  . Diabetes Other   . Hyperlipidemia Other   .  Hypertension Other     DRUG ALLERGIES:  Allergies  Allergen Reactions  . Metformin Diarrhea and Nausea Only  . Other Rash    Sage Wipes    REVIEW OF SYSTEMS:   CONSTITUTIONAL: No fever, fatigue or weakness.  EYES: No blurred or double vision.  EARS, NOSE, AND THROAT: No tinnitus or ear pain.  RESPIRATORY: No cough, positive for shortness of breath, wheezing or hemoptysis.  CARDIOVASCULAR: No chest pain, orthopnea, edema.  GASTROINTESTINAL: No nausea, vomiting, diarrhea or abdominal pain.  GENITOURINARY: No dysuria, hematuria.  ENDOCRINE: No polyuria, nocturia,  HEMATOLOGY: No anemia, easy bruising or bleeding SKIN:  No rash or lesion. MUSCULOSKELETAL: No joint pain or arthritis.   NEUROLOGIC: No tingling, numbness, weakness.  PSYCHIATRY: No anxiety or depression.   MEDICATIONS AT HOME:  Prior to Admission medications   Medication Sig Start Date End Date Taking? Authorizing Provider  acetaminophen (TYLENOL) 325 MG tablet Take 650 mg by mouth every 6 (six) hours as needed for pain.   Yes Historical Provider, MD  calcium citrate-vitamin D (CITRACAL+D) 315-200 MG-UNIT tablet Take 1 tablet by mouth 2 (two) times daily.   Yes Historical Provider, MD  glipiZIDE (GLUCOTROL XL) 5 MG 24 hr tablet Take by mouth. 09/20/14 09/20/15 Yes Historical Provider, MD  hydrochlorothiazide (HYDRODIURIL) 25 MG tablet Take 12.5 mg by mouth daily.    Yes Historical Provider, MD  letrozole (FEMARA) 2.5 MG tablet Take 2.5 mg by mouth daily.   Yes Historical Provider, MD  Multiple Vitamins-Minerals (MULTIVITAMIN PO) Take by mouth.   Yes Historical Provider, MD  naproxen sodium (ANAPROX) 220 MG tablet Take 220 mg by mouth every 8 (eight) hours as needed.   Yes Historical Provider, MD  ondansetron (ZOFRAN) 4 MG tablet Take 1 tablet (4 mg total) by mouth every 6 (six) hours as needed for nausea or vomiting. 03/06/15  Yes Forest Gleason, MD  potassium chloride SA (K-DUR,KLOR-CON) 20 MEQ tablet Take 1 tablet (20 mEq total) by mouth daily. 05/08/15  Yes Forest Gleason, MD  simvastatin (ZOCOR) 10 MG tablet Take 10 mg by mouth daily.   Yes Historical Provider, MD  esomeprazole (NEXIUM) 20 MG capsule Take 1 capsule (20 mg total) by mouth every morning. Patient not taking: Reported on 07/18/2015 03/27/15   Noreene Filbert, MD  gabapentin (NEURONTIN) 300 MG capsule Take 300 mg by mouth as needed.    Historical Provider, MD  nicotine polacrilex (NICORETTE) 2 MG gum Take 2 mg by mouth 5 (five) times daily.     Historical Provider, MD  PARoxetine Mesylate (BRISDELLE PO) Take 1 tablet by mouth at bedtime.    Historical Provider, MD  venlafaxine (EFFEXOR) 37.5  MG tablet Take 1 tablet (37.5 mg total) by mouth daily. Patient not taking: Reported on 07/10/2015 04/17/15   Noreene Filbert, MD      PHYSICAL EXAMINATION:   VITAL SIGNS: Blood pressure 123/90, pulse 114, temperature 98.6 F (37 C), temperature source Axillary, resp. rate 25, height '5\' 2"'$  (1.575 m), weight 65.318 kg (144 lb), SpO2 95 %.  GENERAL:  54 y.o.-year-old patient lying in the bed with no acute distress.  EYES: Pupils equal, round, reactive to light and accommodation. No scleral icterus. Extraocular muscles intact.  HEENT: Head atraumatic, normocephalic. Oropharynx and nasopharynx clear.  NECK:  Supple, no jugular venous distention. No thyroid enlargement, no tenderness.  LUNGS: Normal breath sounds bilaterally, no wheezing, rales,rhonchi or crepitation. Positive use of accessory muscles of respiration. On high flow nasal cannula oxygen. CARDIOVASCULAR: S1, S2 normal.  No murmurs, rubs, or gallops.  ABDOMEN: Soft, nontender, nondistended. Bowel sounds present. No organomegaly or mass.  EXTREMITIES: No pedal edema, cyanosis, or clubbing.  NEUROLOGIC: Cranial nerves II through XII are intact. Muscle strength 5/5 in all extremities. Sensation intact. Gait not checked.  PSYCHIATRIC: The patient is alert and oriented x 3.  SKIN: No obvious rash, lesion, or ulcer.   LABORATORY PANEL:   CBC  Recent Labs Lab 07/31/15 0508 08/02/15 0640  WBC 26.1* 28.3*  HGB 12.3 13.2  HCT 37.6 40.9  PLT 316 335  MCV 91.9 91.9  MCH 30.0 29.7  MCHC 32.7 32.3  RDW 15.7* 15.7*  LYMPHSABS 0.8*  --   MONOABS 0.7  --   EOSABS 0.2  --   BASOSABS 0.1  --    ------------------------------------------------------------------------------------------------------------------  Chemistries   Recent Labs Lab 07/31/15 0508 08/02/15 0640 08/03/15 0518  NA 138 137 140  K 3.4* 3.7 4.2  CL 99* 98* 101  CO2 '30 29 30  '$ GLUCOSE 95 115* 95  BUN 25* 27* 33*  CREATININE 0.93 0.98 0.97  CALCIUM 10.0 10.0  10.3  MG  --  1.9  --   AST 15  --   --   ALT 17  --   --   ALKPHOS 114  --   --   BILITOT 0.2*  --   --    ------------------------------------------------------------------------------------------------------------------ estimated creatinine clearance is 58.1 mL/min (by C-G formula based on Cr of 0.97). ------------------------------------------------------------------------------------------------------------------ No results for input(s): TSH, T4TOTAL, T3FREE, THYROIDAB in the last 72 hours.  Invalid input(s): FREET3   Coagulation profile No results for input(s): INR, PROTIME in the last 168 hours. ------------------------------------------------------------------------------------------------------------------- No results for input(s): DDIMER in the last 72 hours. -------------------------------------------------------------------------------------------------------------------  Cardiac Enzymes No results for input(s): CKMB, TROPONINI, MYOGLOBIN in the last 168 hours.  Invalid input(s): CK ------------------------------------------------------------------------------------------------------------------ Invalid input(s): POCBNP  ---------------------------------------------------------------------------------------------------------------  Urinalysis    Component Value Date/Time   COLORURINE Amber 09/05/2012 0756   APPEARANCEUR Cloudy 09/05/2012 0756   LABSPEC 1.031 09/05/2012 0756   PHURINE 5.0 09/05/2012 0756   GLUCOSEU Negative 09/05/2012 0756   HGBUR 2+ 09/05/2012 0756   BILIRUBINUR Negative 09/05/2012 0756   KETONESUR Negative 09/05/2012 0756   PROTEINUR 30 mg/dL 09/05/2012 0756   NITRITE Negative 09/05/2012 0756   LEUKOCYTESUR Negative 09/05/2012 0756     RADIOLOGY: Dg Chest Port 1 View  08/04/2015  CLINICAL DATA:  Known respiratory failure. EXAM: PORTABLE CHEST 1 VIEW COMPARISON:  Chest x-rays dated 07/31/2015 and 07/26/2015. FINDINGS: Cardiomediastinal  silhouette is stable in size and configuration. Large dense airspace opacity within the left upper lung is unchanged. Milder vague airspace opacity within the lateral aspects of the right lung is unchanged. No new lung findings. No large pleural effusion. Left chest wall Port-A-Cath is stable in position with tip in the lower SVC. Osseous and soft tissue structures about the chest are otherwise unremarkable. IMPRESSION: Stable chest x-ray. Stable dense airspace opacity within the left lung. Smaller vague airspace opacity within the lateral aspects of the right lung is also stable. These consolidations described as pneumonia versus tumor infiltration on CT of 07/18/2015, therefore, continued follow-up recommended to ensure resolution. Electronically Signed   By: Franki Cabot M.D.   On: 08/04/2015 09:51    EKG: Orders placed or performed during the hospital encounter of 07/17/2015  . EKG 12-Lead  . EKG 12-Lead  . ED EKG  . ED EKG  . EKG 12-Lead  . EKG 12-Lead  . EKG 12-Lead  .  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  Non-nasal cannula oxygen for now.  Transfer to medical floor and keep on high flow nasal cannula and decision about complete comfort care and hospice when family meeting with palliative care.   * Diabetes   * Lung cancer  * Hyperlipidemia   This patient is being transferred to floor with limited interventions only. Would like to focus mainly on keeping her comfortable , meeting with palliative care as soon as family is available.   All the records are reviewed and case discussed with ED provider. Management plans discussed with the patient, family and they are in agreement.  CODE STATUS:    Code Status Orders        Start     Ordered    07/30/15 1030  Do not attempt resuscitation (DNR)   Continuous    Question Answer Comment  In the event of cardiac or respiratory ARREST Do not call a "code blue"   In the event of cardiac or respiratory ARREST Do not perform Intubation, CPR, defibrillation or ACLS   In the event of cardiac or respiratory ARREST Use medication by any route, position, wound care, and other measures to relive pain and suffering. May use oxygen, suction and manual treatment of airway obstruction as needed for comfort.      07/30/15 1031       TOTAL TIME TAKING CARE OF THIS PAT35 minutes.    Vaughan Basta M.D on 08/05/2015   Between 7am to 6pm - Pager - [phone]  After 6pm go to [email or web address] - password [email or web address] [facility]  Office  [phone]  CC: Primary care physician; [name], MD   Note: This dictation was prepared with Dragon dictation along with smaller phrase technology. Any transcriptional errors that result from this process are unintentional.

## 2015-08-06 ENCOUNTER — Inpatient Hospital Stay: Payer: No Typology Code available for payment source

## 2015-08-06 ENCOUNTER — Inpatient Hospital Stay: Payer: No Typology Code available for payment source | Attending: Oncology

## 2015-08-06 DIAGNOSIS — Z515 Encounter for palliative care: Secondary | ICD-10-CM

## 2015-08-06 DIAGNOSIS — C3412 Malignant neoplasm of upper lobe, left bronchus or lung: Secondary | ICD-10-CM | POA: Diagnosis not present

## 2015-08-06 DIAGNOSIS — J700 Acute pulmonary manifestations due to radiation: Secondary | ICD-10-CM | POA: Diagnosis not present

## 2015-08-06 DIAGNOSIS — C3431 Malignant neoplasm of lower lobe, right bronchus or lung: Secondary | ICD-10-CM | POA: Diagnosis not present

## 2015-08-06 DIAGNOSIS — R0600 Dyspnea, unspecified: Secondary | ICD-10-CM | POA: Insufficient documentation

## 2015-08-06 DIAGNOSIS — J9600 Acute respiratory failure, unspecified whether with hypoxia or hypercapnia: Secondary | ICD-10-CM | POA: Diagnosis not present

## 2015-08-06 LAB — CREATININE, SERUM
CREATININE: 1.03 mg/dL — AB (ref 0.44–1.00)
GFR calc Af Amer: >60 mL/min (ref >60)

## 2015-08-06 MED ORDER — SODIUM CHLORIDE 0.9 % IV SOLN: 2.0000 mg/h | INTRAVENOUS | Status: DC

## 2015-08-06 MED ORDER — LORAZEPAM 2 MG/ML IJ SOLN
0.5000 mg | Freq: Four times a day (QID) | INTRAMUSCULAR | Status: DC
  Administered 2015-08-06 – 2015-08-07 (×5): 0.5 mg via INTRAVENOUS
  Filled 2015-08-06 (×4): qty 1

## 2015-08-06 MED ORDER — LORAZEPAM 2 MG/ML IJ SOLN
0.5000 mg | INTRAMUSCULAR | Status: DC | PRN
  Administered 2015-08-06 – 2015-08-07 (×2): 0.5 mg via INTRAVENOUS
  Filled 2015-08-06 (×3): qty 1

## 2015-08-06 MED ORDER — MOMETASONE FURO-FORMOTEROL FUM 200-5 MCG/ACT IN AERO
2.0000 | INHALATION_SPRAY | Freq: Two times a day (BID) | RESPIRATORY_TRACT | Status: DC
  Administered 2015-08-06: 14:00:00 2 via RESPIRATORY_TRACT
  Filled 2015-08-06: qty 8.8

## 2015-08-06 MED ORDER — LORAZEPAM 2 MG/ML IJ SOLN: 1.0000 mg | INTRAMUSCULAR | Status: DC

## 2015-08-06 MED ORDER — ENOXAPARIN SODIUM 40 MG/0.4ML ~~LOC~~ SOLN: 40.0000 mg | SUBCUTANEOUS | Status: DC

## 2015-08-06 MED ORDER — IPRATROPIUM-ALBUTEROL 0.5-2.5 (3) MG/3ML IN SOLN
3.0000 mL | Freq: Four times a day (QID) | RESPIRATORY_TRACT | Status: DC
  Administered 2015-08-06 – 2015-08-07 (×5): 3 mL via RESPIRATORY_TRACT
  Filled 2015-08-06 (×5): qty 3

## 2015-08-06 MED ORDER — MORPHINE SULFATE (CONCENTRATE) 10 MG/0.5ML PO SOLN: 5.0000 mg | Freq: Four times a day (QID) | ORAL | Status: DC

## 2015-08-06 MED ORDER — PAROXETINE HCL 20 MG PO TABS: 30.0000 mg | ORAL_TABLET | Freq: Every day | ORAL | Status: DC

## 2015-08-06 MED ORDER — MORPHINE 100MG IN NS 100ML (1MG/ML) PREMIX INFUSION
2.0000 mg/h | INTRAVENOUS | Status: DC
  Administered 2015-08-06: 2 mg/h via INTRAVENOUS
  Filled 2015-08-06: qty 100

## 2015-08-06 NOTE — Plan of Care (Signed)
Problem: Pain Management: Goal: Satisfaction with pain management regimen will improve Outcome: Progressing Pt is alert and oriented, Pt continues on HFNC, unable to tolerate going to CT on non rebreather and CT was not performed, upon return patient placed on bipap for low saturation. Pt's respiratory status progressively worsening, c/o dyspnea at rest, palliative care rounded on patient made comfort care due to worsening condition.Pt is now on HFNC per pt wishes and on morphine drip for dyspnea, receiving scheduled lorazepam. Family at bedside. NO appetite, foley in place, no bm throughout shift. Pt continues to have dyspnea on exertion but is improving with morphine drip.

## 2015-08-06 NOTE — Progress Notes (Signed)
Brittney Lam @ [facility] Telephone:([phone]) [phone]  Fax:([phone]) [place]: 05-Feb-1961  MR#: [id number]  ALP#:[id number]  Patient Care Team: [name], MD as PCP - General (Family Medicine) [name], MD (General Surgery) [name], RN as [place] [facility] [name], MD as Referring Physician (Family Medicine)  CHIEF COMPLAINT:  No chief complaint on file.  Oncology History   54. 54 year old female status post recent left breast cancer in 2005  2. Right breast cancer, locally advanced stage IIIa (pyT3, N1, M0), invasive mammary carcinoma with mucin production. Status post right modified radical mastectomy for 8 cm lesion with one of 4 sentinel lymph nodes positive for metastatic disease. Tumor is ER positive PR negative HER-2/neu not overexpressed. 3. Completed neoadjuvant chemotherapy with chest wall radiation therapy,  Finished on October 21, 2013. 4. Started letrazole 2.5 mg by mouth daily from October 24, 2013. 5. Carcinoma of lung.  Right upper lobe, status post right upper lobectomy, T1BN0M0. 6.  Left upper lobe mass with mediastinal lymph node biopsies positive for poorly differentiated adenocarcinoma T1 N1 M0 tumor III Starting chemoradiation therapy in July of 2016 (second primary) Patient had EUS in June of 2016.  Bilateral hilar adenopathy which has been biopsied was positive for metastatic adenocarcinoma consistent with lung primary Starting chemoradiation therapy in July of 2016 7.  Has finished 6 cycles of chemotherapy with carboplatinum and Taxol concurrent with radiation therapy on April 10, 2015       INTERVAL HISTORY:  54 year old African-American lady with a history of multiple malignancies admitted in hospital with acute respiratory failure.  Bilateral upper lobe infiltrates and suspected radiation pneumonitis versus progressing cancer versus infection Patient continues to be very apprehensive  emotional crying family by the side of bed. Patient is extremely short of breath when nasal cannula oxygen is being started. Very emotional crying Patient could not get CT scan done when she moved from aspirin to bed 2 strictures he got very short of breath.  Family with patient.  Patient has a Foley catheter   Review of systems  general status: Patient is feeling weak and tired.  No change in a performance status.  No chills.  No fever. HEENT: Difficulty swallowing. Lungs: Increasing shortness of breath.  Cough.  No hemoptysis.  Oxygen saturation dropped to 81%. Cardiac: No chest pain or paroxysmal nocturnal dyspnea GI: No nausea no vomiting no diarrhea no abdominal pain Skin: No rash Lower extremity no swelling Left upper extremity is swelling.  Has improved Neurological system: No tingling.  No numbness.  No other focal signs Musculoskeletal system no bony pains    PAST MEDICAL HISTORY: Past Medical History  Diagnosis Date  . Unspecified essential hypertension   . Kidney failure   . Personal history of tobacco use, presenting hazards to health   . Cataract   . Personal history of malignant neoplasm of breast   . Breast screening, unspecified   . Special screening for malignant neoplasms, colon   . Diabetes mellitus without complication ([place])   . MDRO (multiple drug resistant organisms) resistance   . H. pylori duodenitis   . Complication of anesthesia     BP DROPPED DURING LOBECTOMY IN 2014  . Cancer [facility]) 2001    left breast cancer s/p L/SN/R in 2001  . Cancer [facility]) 2014    right breast invasive CA, Er pos,Pr neg, Her 2 neg.  . Lung cancer, upper lobe ([place]) 2014    right  upper lobe  . Breast cancer ([place])     PAST SURGICAL HISTORY: Past Surgical History  Procedure Laterality Date  . Intercostal nerve block  2005  . Cataract extraction w/ intraocular lens implant  2005  . Carpal tunnel release Right 2011  . Insertion central venous access device w/ subcutaneous port   12-07-12  . Breast lumpectomy Left 2001  . Breast surgery Right 2014    total mastectomy  . Right lung upper lobectomy  03/2013  . Lobectomy Right   . Cataract extraction    . Mastectomy Right   . Colonoscopy    . Endobronchial ultrasound N/A 02/05/2015    Procedure: ENDOBRONCHIAL ULTRASOUND;  Surgeon: Flora Lipps, MD;  Location: [facility] ORS;  Service: Cardiopulmonary;  Laterality: N/A;  . Flexible bronchoscopy N/A 07/14/2015    Procedure: FLEXIBLE BRONCHOSCOPY;  Surgeon: Allyne Gee, MD;  Location: [facility] ORS;  Service: Pulmonary;  Laterality: N/A;    FAMILY HISTORY Family History  Problem Relation Age of Onset  . Diabetes Other   . Hyperlipidemia Other   . Hypertension Other         ADVANCED DIRECTIVES: Patient does not have any advanced healthcare directive. Information has been given.   HEALTH MAINTENANCE: Social History  Substance Use Topics  . Smoking status: Former Smoker -- 0.50 packs/day for 30 years    Types: Cigarettes    Quit date: 02/03/2013  . Smokeless tobacco: Never Used     Comment: using 34m nicorette gum- 1/2 piece, 10 times per day  . Alcohol Use: No      Allergies  Allergen Reactions  . Metformin Diarrhea and Nausea Only  . Other Rash    Sage Wipes    Current Facility-Administered Medications  Medication Dose Route Frequency Provider Last Rate Last Dose  . acetaminophen (TYLENOL) tablet 650 mg  650 mg Oral Q4H PRN LEvlyn Kanner NP   650 mg at 07/30/15 1456  . ALPRAZolam (Duanne Moron tablet 0.5 mg  0.5 mg Oral Q6H PRN PLaverle Hobby MD   0.5 mg at 08/05/15 0850  . butamben-tetracaine-benzocaine (CETACAINE) spray 1 spray  1 spray Topical Once SAllyne Gee MD      . citalopram (CELEXA) tablet 20 mg  20 mg Oral Daily JForest Gleason MD   20 mg at 08/06/15 1107  . guaiFENesin-dextromethorphan (ROBITUSSIN DM) 100-10 MG/5ML syrup 10 mL  10 mL Oral Q4H PRN LEvlyn Kanner NP   10 mL at 07/31/15 1226  . levalbuterol (XOPENEX) nebulizer solution  1.25 mg  1.25 mg Nebulization Q4H PRN Vishal Mungal, MD   1.25 mg at 08/04/15 1311  . lidocaine (XYLOCAINE) 2 % jelly 1 application  1 application Topical Once SAllyne Gee MD      . LORazepam (ATIVAN) injection 2 mg  2 mg Intravenous Q6H PRN PLaverle Hobby MD   2 mg at 08/03/15 1621  . LORazepam (ATIVAN) tablet 0.5 mg  0.5 mg Oral BID JForest Gleason MD   0.5 mg at 08/06/15 1107  . morphine 2 MG/ML injection 2 mg  2 mg Intravenous Q2H PRN PLaverle Hobby MD   2 mg at 08/06/15 1229  . nystatin (MYCOSTATIN) 100000 UNIT/ML suspension 500,000 Units  5 mL Mouth/Throat QID MLequita Asal MD   500,000 Units at 08/06/15 1107  . PARoxetine (PAXIL) tablet 10 mg  10 mg Oral QHS LEvlyn Kanner NP   10 mg at 08/05/15 2246  . phenylephrine (NEO-SYNEPHRINE) 0.25 % nasal spray 1 spray  1  spray Each Nare Q6H PRN Allyne Gee, MD      . predniSONE (DELTASONE) tablet 60 mg  60 mg Oral Q breakfast Laverle Hobby, MD   60 mg at 08/06/15 1107  . senna-docusate (Senokot-S) tablet 1 tablet  1 tablet Oral QHS PRN Evlyn Kanner, NP       Facility-Administered Medications Ordered in Other Encounters  Medication Dose Route Frequency Provider Last Rate Last Dose  . sodium chloride 0.9 % injection 10 mL  10 mL Intracatheter PRN Forest Gleason, MD   10 mL at 02/04/15 1027    OBJECTIVE:  Filed Vitals:   08/06/15 0013 08/06/15 0418  BP:  133/74  Pulse: 79 74  Temp:  97.4 F (36.3 C)  Resp: 19      Body mass index is 26.33 kg/(m^2).    ECOG FS:0 - Asymptomatic  PHYSICAL EXAM: GENERAL:  Performance status is declining Patient is in mild distress MENTAL STATUS:  Alert and oriented to person, place and time. HEAD:   Normocephalic, atraumatic, face symmetric, no Cushingoid features. EYES:    Pupils equal round and reactive to light and accomodation.  No conjunctivitis or scleral icterus. ENT:  Oropharynx clear without lesion.  Tongue normal. Mucous membranes moist.  RESPIRATORY wheezing and  rhonchi on both sides CARDIOVASCULAR:  Regular rate and rhythm without murmur, rub or gallop. BREAST:  Right breast : Status post mastectomy.  No evidence of recurrent disease skin changes or nipple discharge.  Left breast without masses, skin changes or nipple discharge. ABDOMEN:  Soft, non-tender, with active bowel sounds, and no hepatosplenomegaly.  No masses. BACK:  No CVA tenderness.  No tenderness on percussion of the back or rib cage. SKIN:  No rashes, ulcers or lesions. EXTREMITIES: No edema, no skin discoloration or tenderness.  No palpable cords. Swelling of the left upper extremity LYMPH NODES: No palpable cervical, supraclavicular, axillary or inguinal adenopathy  NEUROLOGICAL: Unremarkable. PSYCH:  Appropriate.   LAB RESULTS:  Admission on 07/22/2015  No results displayed because visit has over 200 results.      Lab Results  Component Value Date   LABCA2 20.3 06/11/2014     STUDIES: Dg Chest 1 View  07/31/2015  CLINICAL DATA:  54 year old female with shortness of Breath. Previous right upper lobectomy for lung cancer in 2014. Metastatic disease, left lung mass, mediastinal lymphadenopathy, and pleural based lesion. Initial encounter. EXAM: CHEST 1 VIEW COMPARISON:  07/30/2015 and earlier. FINDINGS: Portable AP upright view at 0549 hours. Since 07/22/2015 confluent left upper lobe airspace disease has slightly regressed. Stable lung volumes. Stable cardiac size and mediastinal contours. Left chest porta cath remains in place and is accessed. Right upper lobe airspace disease has significantly regressed since 07/18/2015. No pneumothorax, pleural effusion or pulmonary edema. IMPRESSION: 1. Slightly improved dense airspace disease in the left upper lobe, and significantly improved right upper lobe, since 07/18/2015. 2. No new cardiopulmonary abnormality identified. Electronically Signed   By: Genevie Ann M.D.   On: 07/31/2015 07:18   Dg Chest 1 View  07/30/2015  CLINICAL DATA:   Chest pain. EXAM: CHEST 1 VIEW COMPARISON:  07/26/2015. FINDINGS: Port-A-Cath noted with tip in stable position. Heart size stable. Persistent bilateral pulmonary infiltrates, particular prominent left upper lobe, no interim change. No pleural effusion or pneumothorax. No acute bony abnormality. IMPRESSION: 1. Port-A-Cath in stable position. 2. Persistent bilateral pulmonary infiltrates, particular prominent left upper lobe. No interim change. Electronically Signed   By: Marcello Moores  Register   On: 07/30/2015  07:46   Dg Chest 1 View  07/22/2015  CLINICAL DATA:  Recurrent left upper lobe lung cancer for which the patient is currently being treated, presenting with acutely worsening shortness of breath, cough and chest congestion. EXAM: Portable CHEST 1 VIEW COMPARISON:  CT chest 07/18/2015 and earlier.  PET-CT 06/14/2015. FINDINGS: Suboptimal inspiration. Cardiac silhouette upper normal in size. Dense airspace consolidation in the left upper lobe with air bronchograms, unchanged since the CT 4 days ago. Improved aeration in the right upper lobe, though patchy airspace opacities persist. Consolidation medially in the left lower lobe, unchanged. Small bilateral pleural effusions, unchanged. No new pulmonary parenchymal abnormalities. Left subclavian Port-A-Cath tip projects at or near the cavoatrial junction. IMPRESSION: Since the CT chest 4 days ago: 1. Improved aeration in the right upper lobe, though patchy airspace opacities persist indicating residual pneumonia. 2. 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 3. No change in the consolidation in the medial right lower lobe, likely post radiation pneumonitis. 4. No new abnormalities. Electronically Signed   By: Evangeline Dakin M.D.   On: 07/22/2015 19:33   Ct Angio Chest Pe W/cm &/or Wo Cm  07/18/2015  CLINICAL DATA:  Short of breath for several weeks EXAM: CT  ANGIOGRAPHY CHEST WITH CONTRAST TECHNIQUE: Multidetector CT imaging of the chest was performed using the standard protocol during bolus administration of intravenous contrast. Multiplanar CT image reconstructions and MIPs were obtained to evaluate the vascular anatomy. CONTRAST:  160m OMNIPAQUE IOHEXOL 350 MG/ML SOLN COMPARISON:  PET-CT 06/14/2015 FINDINGS: There are no filling defects in the pulmonary arterial tree to suggest acute pulmonary thromboembolism. Mediastinal adenopathy is not significantly changed since the PET-CT from 1 month ago. Left subclavian Port-A-Cath is stable. Consolidation within the apical left upper lobe and superior segment of the right lower lobe has markedly worsened. Very small bilateral pleural effusions have developed. No pneumothorax. The 1.4 cm left upper lobe lung mass on image 43 of series 8 is stable. Postoperative changes from right mastectomy are again noted. Bilateral rib fractures again noted. Right rib fractures are noticeably displaced. There are sclerotic areas in upper left antral lateral ribs without fractures which may represent rib lesions. There are also fractures and left mid to lower anterior ribs with some evidence of healing. These are all not significantly changed. Review of the MIP images confirms the above findings. IMPRESSION: No evidence of acute pulmonary thromboembolism Consolidation in the left upper lobe and superior segment of the right lower lobe has worsened. This may represent pneumonia, pneumonitis, or infiltration with tumor. New very small pleural effusions. Stable mediastinal adenopathy. Bilateral of rib fractures are again noted and are not significantly changed. They may represent pathologic fractures. Electronically Signed   By: AMarybelle KillingsM.D.   On: 07/18/2015 11:02   UKoreaVenous Img Upper Uni Left  07/11/2015  CLINICAL DATA:  Left upper extremity swelling x1 month. Diabetes. Left subclavian surgically placed port catheter. EXAM: LEFT  UPPER EXTREMITY VENOUS DOPPLER ULTRASOUND TECHNIQUE: Gray-scale sonography with graded compression, as well as color Doppler and duplex ultrasound were performed to evaluate the upper extremity deep venous system from the level of the subclavian vein and including the jugular, axillary, basilic and upper cephalic vein. Spectral Doppler was utilized to evaluate flow at rest and with distal augmentation maneuvers. COMPARISON:  CT 07/18/2015 and previous FINDINGS: Thrombus within deep veins: Noncompressible thrombus in the axillary vein over short segment. Brachial, radial,  ulnar, basilic, and cephalic veins remain patent and compressible, with somewhat monophasic waveforms probably related to the central obstruction. There is a monophasic antegrade waveform in the visualized subclavian vein, without evidence of transmitted right atrial pulsations. Other findings: Images of contralateral subclavian vein are unremarkable. IMPRESSION: 1. Isolated occlusive left axillary  DVT. Electronically Signed   By: Lucrezia Europe M.D.   On: 07/04/2015 16:44   Dg Chest Port 1 View  08/04/2015  CLINICAL DATA:  Known respiratory failure. EXAM: PORTABLE CHEST 1 VIEW COMPARISON:  Chest x-rays dated 07/31/2015 and 07/26/2015. FINDINGS: Cardiomediastinal silhouette is stable in size and configuration. Large dense airspace opacity within the left upper lung is unchanged. Milder vague airspace opacity within the lateral aspects of the right lung is unchanged. No new lung findings. No large pleural effusion. Left chest wall Port-A-Cath is stable in position with tip in the lower SVC. Osseous and soft tissue structures about the chest are otherwise unremarkable. IMPRESSION: Stable chest x-ray. Stable dense airspace opacity within the left lung. Smaller vague airspace opacity within the lateral aspects of the right lung is also stable. These consolidations described as pneumonia versus tumor infiltration on CT of 07/18/2015, therefore, continued  follow-up recommended to ensure resolution. Electronically Signed   By: Franki Cabot M.D.   On: 08/04/2015 09:51   Dg Chest Port 1 View  07/26/2015  CLINICAL DATA:  Respiratory failure. History of lung and breast cancer. EXAM: PORTABLE CHEST 1 VIEW COMPARISON:  07/22/2015 FINDINGS: Left-sided injectable central venous catheter is stable. Cardiomediastinal silhouette is normal. Mediastinal contours appear intact. There is no evidence of pneumothorax. Dense left upper lobe airspace consolidation/pulmonary mass is stable in appearance. There also remains a patchy airspace consolidation in the right upper lobe. No definite evidence of pleural effusion. Osseous structures are without acute abnormality. There has been a prior right mastectomy. IMPRESSION: No significant change in the appearance of the lungs with dense left upper lobe airspace consolidation and/or pulmonary mass, and patchy airspace consolidation in the right upper lobe. Electronically Signed   By: Fidela Salisbury M.D.   On: 07/26/2015 09:31    ASSESSMENT: 1.  Patient has a history of lung cancer bilateral recently had stage III disease status post radiation chemotherapy now admitted with acute on chronic respiratory failure or suspected secondary to radiation pneumonitis On steroid and on   Empirical  antibiotics. On high flow oxygen Negative bronchus for any malignancy and cultures are negative I discussed situation with family as well as with palliative care physician today.  Plan is to continue supportive measures if needed a CT scan can be done to rule out any associated problem like pulmonary embolism I convince patient to try nebulizer treatment and use morphine for air hunger.  We may put patient on MS Contin on a regular basis to help with anxiety.  Increase Celexa. Hold CT scan to shortness of breath improves. Will get a hospice involvement family is ready   Malignant neoplasm of female breast   Staging form: Breast, AJCC  7th Edition     Clinical: Stage IIIA (T3, N1, M0) - Signed by Forest Gleason, MD on 01/26/2015 Malignant neoplasm of upper lobe, bronchus or lung   Staging form: Lung, AJCC 7th Edition     Clinical: Stage IA (T1b, N0, M0) - Signed by Forest Gleason, MD on 01/26/2015   Forest Gleason, MD   08/06/2015 1:09 PM

## 2015-08-06 NOTE — Progress Notes (Signed)
Notified Dr. Jeb Levering in person that patient could not tolerate chest CT and ask question if comfort care disorder can be d/c considering palliative care has not consulted yet and family has not made a decision, Per Dr. Jeb Levering leave comfort care order in place considering patient is a DNR.

## 2015-08-06 NOTE — Progress Notes (Signed)
[name] NAME: Brittney Lam    MR#:  [id number]  DATE OF BIRTH:  10-Dec-1960  SUBJECTIVE:   On high flow nasal cannula continues to complain of shortness of breath  REVIEW OF SYSTEMS:   Review of Systems  Constitutional: Negative for fever, chills and weight loss.  HENT: Negative for ear discharge, ear pain and nosebleeds.   Eyes: Negative for blurred vision, pain and discharge.  Respiratory: Positive for cough, sputum production and shortness of breath. Negative for wheezing and stridor.   Cardiovascular: Negative for chest pain, palpitations, orthopnea and PND.  Gastrointestinal: Negative for nausea, vomiting, abdominal pain and diarrhea.  Genitourinary: Negative for urgency and frequency.  Musculoskeletal: Negative for back pain and joint pain.  Neurological: Positive for weakness. Negative for sensory change, speech change and focal weakness.  Psychiatric/Behavioral: Negative for depression and hallucinations. The patient is not nervous/anxious.   All other systems reviewed and are negative.  Tolerating Diet:yes Tolerating PT: not seen yet since on HFNC  DRUG ALLERGIES:   Allergies  Allergen Reactions  . Metformin Diarrhea and Nausea Only  . Other Rash    Sage Wipes    VITALS:  Blood pressure 133/74, pulse 74, temperature 97.4 F (36.3 C), temperature source Oral, resp. rate 19, height '5\' 2"'$  (1.575 m), weight 65.318 kg (144 lb), SpO2 90 %.  PHYSICAL EXAMINATION:   Physical Exam  GENERAL:  54 y.o.-year-old patient lying in the bed with mild  acute distress.  EYES: Pupils equal, round, reactive to light and accommodation. No scleral icterus. Extraocular muscles intact.  HEENT: Head atraumatic, normocephalic. Oropharynx and nasopharynx clear.  NECK:  Supple, no jugular venous distention. No thyroid enlargement, no tenderness.  LUNGS: Decreased breath sounds bilaterally with some asssory muscle  usage CARDIOVASCULAR: S1, S2 normal. No murmurs, rubs, or gallops. Mild tachycardia ABDOMEN: Soft, nontender, nondistended. Bowel sounds present. No organomegaly or mass.  EXTREMITIES: No cyanosis, clubbing or edema b/l.    NEUROLOGIC: Cranial nerves II through XII are intact. No focal Motor or sensory deficits b/l.   PSYCHIATRIC: The patient is alert and oriented x 3.  SKIN: No obvious rash, lesion, or ulcer.    LABORATORY PANEL:   CBC  Recent Labs Lab 08/02/15 0640  WBC 28.3*  HGB 13.2  HCT 40.9  PLT 335    Chemistries   Recent Labs Lab 07/31/15 0508 08/02/15 0640 08/03/15 0518 08/06/15 0447  NA 138 137 140  --   K 3.4* 3.7 4.2  --   CL 99* 98* 101  --   CO2 '30 29 30  '$ --   GLUCOSE 95 115* 95  --   BUN 25* 27* 33*  --   CREATININE 0.93 0.98 0.97 1.03*  CALCIUM 10.0 10.0 10.3  --   MG  --  1.9  --   --   AST 15  --   --   --   ALT 17  --   --   --   ALKPHOS 114  --   --   --   BILITOT 0.2*  --   --   --     Cardiac Enzymes No results for input(s): TROPONINI in the last 168 hours.  RADIOLOGY:  No results found.   ASSESSMENT AND PLAN:  Brittney Lam is a 54 y.o. female with a known history of hypertension, diabetes, lung cancer in left upper lobe and in past had breast cancer status post radiation and  surgery- admitted for last few days under oncology service for respiratory distress and is being treated for possible radiation pneumonitis or pneumonia.  * Acute respiratory failure with hypoxia Left upper lobe lung cancer, radiation pneumonitis and bilateral pneumonia BAL is negative 07/25/2015) -completed broad-spectrum antibiotic coverage with IV vanc, zithromax  and zosyn --completed abxs -Status post treatment with IV steroid (high doses for possible radiation pneumonitis) -HFNC  -now DNR, prognosis long term poor Failure to improve,l palliative care following the patient  * Diabetes Place patient on sliding scale insulin  * Lung cancer Last  chemo and radiation several weeks ago  * Hyperlipidemia  Zocor.    CODE STATUS: DNR  DVT Prophylaxis: lovenox  TOTAL  TIME TAKING CARE OF THIS PATIENT: 30 minutes.  Dustin Flock M.D on 08/06/2015 at 2:23 PM  Between 7am to 6pm - Pager - [phone]  After 6pm go to [email or web address] - password [email or web address] [facility]  [facility]  Office  [phone]  CC: Primary care physician; [name], MD

## 2015-08-06 NOTE — Progress Notes (Signed)
   08/06/15 1300  Clinical Encounter Type  Visited With Patient and family together  Visit Type Initial  Referral From Chaplain  Consult/Referral To Chaplain  Provided pastoral presence and support to patient and family members in room.  Pt said she was "doing fine".  [place] [phone]

## 2015-08-06 NOTE — Progress Notes (Addendum)
Palliative Care Update  Be aware that patient is under 'MODIFIED COMFORT MEASURES".  This means that pt is having some orders placed as part of a stepwise dropping down from aggressive care to full comfort care.  Rather than going from fully aggressive-- to fully comfort only--- the patient and Dr Oliva Bustard have worked out a stepwise gradual approach to comfort only.   So --pt is comfort care, but orders for CT etc may still be placed.  I had a long talk with Dr. Oliva Bustard and will discuss further with pt.  She could not tolerate the CT this am --but might have IF she would agree to take some Roxanol for her air hunger.  Plan is to see how she is doing in the next couple of days so we can determine best disposition.  Will convey more about this in full note to follow.   Pt now agrees to some routine and prn morphine (routine via oral route using Roxanol) and prn via IV (faster acting route).  She is asking for a dose now that she knows it is not being given to 'push her off the cliff' but rather to help her 'feel like her breathing is easier'  I have also spoken with [employer] fast streamer, Chong Sicilian, who was in to visit pt today.  A hospice consult today is premature --though it will likely be needed in a day or two.    Colleen Can, MD

## 2015-08-06 NOTE — Consult Note (Signed)
Palliative Medicine Inpatient Consult Note   Name: Brittney Lam Date: 08/06/2015 MRN: [id number]  DOB: 16-Sep-1960  Referring Physician: Dustin Flock, MD  Palliative Care consult requested for this 54 y.o. female for goals of medical therapy in patient with acute on chronic respiratory failure due to cancer and/ or pneumonia.  TODAY'S DISCUSSIONS AND DECISIONS: 1.  Pt continues to be DNR status.  2.  Pt and family were upset to hear pt would 'die within a few hours' and are inclined to want to hear different information.  However, over the day today, patient is showing that she is more likely to pass away sooner than later.  She may not survive the night.  3.  Family and patient are in some degree of denial and this is being addressed gradually and gently, though patient's death is becoming more imminent as she becomes more distressed due to hypoxia.  4.  Pt had been reluctant to accept any morphine, thinking it would kill her.  Now, however, she is more accepting of some small doses.  The doses are lower than what pt needs based on how distressed she is, but her husband is also reluctant to let her have much --holding on to hope that her nebs and the oxygen will help her.    5.  I have been able to start a morphine drip at 2 mg/ hr.  We should be able to go up on this, but family will need a lot of supportive discussions.  Right now, family seems to be trying to keep pt distracted by talking with her all day long --pt seems exhausted.  I have let family know that they might need to let her rest more.   6.  I have entered other comfort measures.  Unfortunately, husband want the pulse ox back on continuously and he is going to obsess over this --probably until she passes away.    7.  There is no hope of her making it to [facility] as her oxygen needs are too high and she would not survive the trip. She is not responding to steroids and was not able to go get the CT scan this am.  She is  worsening very quickly.      IMPRESSION: 1. Acute on recently chronic hypoxic respiratory failure --due to cancer in lungs and/ or pneumonia --bilateral upper lobe infiltrates may be pneumonitis or progressing cancer  --has completed ABX courses --negative bronch for malignancy and cultures were negative --has been on BIPAP off and on --does not like it much --has also been on Hi Flow (very Hi Flow --100% and many LPM) --sats are dropping now 2. Lung cancer  --lung bx pos for poorly diff. adneoca T1 N1 M0 tumor III ---was stage 3 s/p CRT now with spread/ vs infxn vs both vs pneumonitis 3. Breast Cancer  ---detailes per oncology notes 4.  DM2 5.  H/O tobacco smoking 6.  CKD stage not known  7. Essential HTN 8.  Leukocytosis  --------------------------------------------------------------  REVIEW OF SYSTEMS:  Patient is not able to provide ROS due to respiratory distress  SPIRITUAL SUPPORT SYSTEM: Yes --family.  SOCIAL HISTORY:  reports that she quit smoking about 2 years ago. Her smoking use included Cigarettes. She has a 15 pack-year smoking history. She has never used smokeless tobacco. She reports that she does not drink alcohol or use illicit drugs.  LEGAL DOCUMENTS:  Portable DNR form is placed in chart  CODE STATUS: DNR  PAST MEDICAL  HISTORY: Past Medical History  Diagnosis Date  . Unspecified essential hypertension   . Kidney failure   . Personal history of tobacco use, presenting hazards to health   . Cataract   . Personal history of malignant neoplasm of breast   . Breast screening, unspecified   . Special screening for malignant neoplasms, colon   . Diabetes mellitus without complication ([place])   . MDRO (multiple drug resistant organisms) resistance   . H. pylori duodenitis   . Complication of anesthesia     BP DROPPED DURING LOBECTOMY IN 2014  . Cancer [facility]) 2001    left breast cancer s/p L/SN/R in 2001  . Cancer [facility]) 2014    right breast invasive CA,  Er pos,Pr neg, Her 2 neg.  . Lung cancer, upper lobe ([place]) 2014    right upper lobe  . Breast cancer ([place])     PAST SURGICAL HISTORY:  Past Surgical History  Procedure Laterality Date  . Intercostal nerve block  2005  . Cataract extraction w/ intraocular lens implant  2005  . Carpal tunnel release Right 2011  . Insertion central venous access device w/ subcutaneous port  12-07-12  . Breast lumpectomy Left 2001  . Breast surgery Right 2014    total mastectomy  . Right lung upper lobectomy  03/2013  . Lobectomy Right   . Cataract extraction    . Mastectomy Right   . Colonoscopy    . Endobronchial ultrasound N/A 02/05/2015    Procedure: ENDOBRONCHIAL ULTRASOUND;  Surgeon: Flora Lipps, MD;  Location: [facility] ORS;  Service: Cardiopulmonary;  Laterality: N/A;  . Flexible bronchoscopy N/A 07/08/2015    Procedure: FLEXIBLE BRONCHOSCOPY;  Surgeon: Allyne Gee, MD;  Location: [facility] ORS;  Service: Pulmonary;  Laterality: N/A;    ALLERGIES:  is allergic to metformin and other.  MEDICATIONS:  Current Facility-Administered Medications  Medication Dose Route Frequency Provider Last Rate Last Dose  . acetaminophen (TYLENOL) tablet 650 mg  650 mg Oral Q4H PRN Evlyn Kanner, NP   650 mg at 07/30/15 1456  . guaiFENesin-dextromethorphan (ROBITUSSIN DM) 100-10 MG/5ML syrup 10 mL  10 mL Oral Q4H PRN Evlyn Kanner, NP   10 mL at 07/31/15 1226  . ipratropium-albuterol (DUONEB) 0.5-2.5 (3) MG/3ML nebulizer solution 3 mL  3 mL Nebulization QID Forest Gleason, MD   3 mL at 08/06/15 1432  . levalbuterol (XOPENEX) nebulizer solution 1.25 mg  1.25 mg Nebulization Q4H PRN Vishal Mungal, MD   1.25 mg at 08/04/15 1311  . LORazepam (ATIVAN) injection 0.5 mg  0.5 mg Intravenous Q6H Colleen Can, MD      . LORazepam (ATIVAN) injection 0.5 mg  0.5 mg Intravenous Q2H PRN Colleen Can, MD      . mometasone-formoterol [facility]) 200-5 MCG/ACT inhaler 2 puff  2 puff Inhalation BID Forest Gleason, MD   2 puff at  08/06/15 1421  . morphine '100mg'$  in NS 160m ('1mg'$ /mL) infusion - premix  2 mg/hr Intravenous Continuous SDustin Flock MD 2 mL/hr at 08/06/15 1606 2 mg/hr at 08/06/15 1606  . morphine 2 MG/ML injection 2 mg  2 mg Intravenous Q2H PRN PLaverle Hobby MD   2 mg at 08/06/15 1421  . PARoxetine (PAXIL) tablet 30 mg  30 mg Oral QHS JForest Gleason MD      . phenylephrine (NEO-SYNEPHRINE) 0.25 % nasal spray 1 spray  1 spray Each Nare Q6H PRN SAllyne Gee MD      . predniSONE (DELTASONE) tablet 60 mg  60 mg Oral Q breakfast Laverle Hobby, MD   60 mg at 08/06/15 1107  . senna-docusate (Senokot-S) tablet 1 tablet  1 tablet Oral QHS PRN Evlyn Kanner, NP       Facility-Administered Medications Ordered in Other Encounters  Medication Dose Route Frequency Provider Last Rate Last Dose  . sodium chloride 0.9 % injection 10 mL  10 mL Intracatheter PRN Forest Gleason, MD   10 mL at 02/04/15 1027    Vital Signs: BP 133/74 mmHg  Pulse 74  Temp(Src) 97.4 F (36.3 C) (Oral)  Resp 19  Ht '5\' 2"'$  (1.575 m)  Wt 65.318 kg (144 lb)  BMI 26.33 kg/m2  SpO2 90% Filed Weights   07/26/2015 1142  Weight: 65.318 kg (144 lb)    Estimated body mass index is 26.33 kg/(m^2) as calculated from the following:   Height as of this encounter: '5\' 2"'$  (1.575 m).   Weight as of this encounter: 65.318 kg (144 lb).  PERFORMANCE STATUS (ECOG) : 4 - Bedbound  PHYSICAL EXAM: Head of bed is elevated At first, she was on Hi Flow oxygen and still having some retractions of her chest wall And some conversational dyspnea Later --on third visit --she was on BIPAP again and becoming increasingly dyspneic. She appears afraid EOMI OP dry Neck wo JVD or TM Hrt rrr no m Lungs ronchi throughout Abd soft and NT Ext no mottling yet --hard to tell if early cyanosis is present   Numerous family/ visitors have kept her awake and have been talking to her all day so far.     LABS: CBC:    Component Value Date/Time   WBC  28.3* 08/02/2015 0640   WBC 10.7 05/30/2014 1408   WBC 12.2 07/20/2013   HGB 13.2 08/02/2015 0640   HGB 13.6 05/30/2014 1408   HCT 40.9 08/02/2015 0640   HCT 40.4 05/30/2014 1408   HCT 37.5 07/20/2013   PLT 335 08/02/2015 0640   PLT 299 05/30/2014 1408   MCV 91.9 08/02/2015 0640   MCV 95 05/30/2014 1408   NEUTROABS 24.3* 07/31/2015 0508   NEUTROABS 7.1* 05/30/2014 1408   LYMPHSABS 0.8* 07/31/2015 0508   LYMPHSABS 2.2 05/30/2014 1408   MONOABS 0.7 07/31/2015 0508   MONOABS 1.0* 05/30/2014 1408   EOSABS 0.2 07/31/2015 0508   EOSABS 0.3 05/30/2014 1408   BASOSABS 0.1 07/31/2015 0508   BASOSABS 0.2* 05/30/2014 1408   Comprehensive Metabolic Panel:    Component Value Date/Time   NA 140 08/03/2015 0518   NA 135* 06/11/2014 1417   NA 135* 07/20/2013   K 4.2 08/03/2015 0518   K 3.4* 06/11/2014 1417   CL 101 08/03/2015 0518   CL 101 06/11/2014 1417   CO2 30 08/03/2015 0518   CO2 27 06/11/2014 1417   BUN 33* 08/03/2015 0518   BUN 10 06/11/2014 1417   CREATININE 1.03* 08/06/2015 0447   CREATININE 0.99 06/11/2014 1417   CREATININE 0.9 07/20/2013   GLUCOSE 95 08/03/2015 0518   GLUCOSE 263* 06/11/2014 1417   CALCIUM 10.3 08/03/2015 0518   CALCIUM 9.4 06/11/2014 1417   AST 15 07/31/2015 0508   AST 18 06/11/2014 1417   ALT 17 07/31/2015 0508   ALT 38 06/11/2014 1417   ALKPHOS 114 07/31/2015 0508   ALKPHOS 141* 06/11/2014 1417   BILITOT 0.2* 07/31/2015 0508   BILITOT 0.3 06/11/2014 1417   PROT 7.0 07/31/2015 0508   PROT 7.3 06/11/2014 1417   ALBUMIN 2.9* 07/31/2015 0508   ALBUMIN 3.8  06/11/2014 1417     More than 50% of the visit was spent in counseling/coordination of care: Yes  Time Spent:120  minutes

## 2015-08-07 DIAGNOSIS — J700 Acute pulmonary manifestations due to radiation: Secondary | ICD-10-CM | POA: Diagnosis not present

## 2015-08-07 DIAGNOSIS — C3412 Malignant neoplasm of upper lobe, left bronchus or lung: Secondary | ICD-10-CM | POA: Diagnosis not present

## 2015-08-07 DIAGNOSIS — J9600 Acute respiratory failure, unspecified whether with hypoxia or hypercapnia: Secondary | ICD-10-CM | POA: Diagnosis not present

## 2015-08-07 DIAGNOSIS — C3431 Malignant neoplasm of lower lobe, right bronchus or lung: Secondary | ICD-10-CM | POA: Diagnosis not present

## 2015-08-07 LAB — BASIC METABOLIC PANEL
Anion gap: 9 (ref 5–15)
BUN: 70 mg/dL — AB (ref 6–20)
CHLORIDE: 98 mmol/L — AB (ref 101–111)
CO2: 30 mmol/L (ref 22–32)
CREATININE: 2.1 mg/dL — AB (ref 0.44–1.00)
Calcium: 9.9 mg/dL (ref 8.9–10.3)
GFR calc non Af Amer: 25 mL/min — ABNORMAL LOW (ref >60)
GFR, EST AFRICAN AMERICAN: 29 mL/min — AB (ref >60)
Glucose, Bld: 215 mg/dL — ABNORMAL HIGH (ref 65–99)
POTASSIUM: 6 mmol/L — AB (ref 3.5–5.1)
Sodium: 137 mmol/L (ref 135–145)

## 2015-08-07 LAB — CBC
HEMATOCRIT: 42.2 % (ref 35.0–47.0)
HEMOGLOBIN: 13.7 g/dL (ref 12.0–16.0)
MCH: 30.2 pg (ref 26.0–34.0)
MCHC: 32.5 g/dL (ref 32.0–36.0)
MCV: 93 fL (ref 80.0–100.0)
Platelets: 336 10*3/uL (ref 150–440)
RBC: 4.53 MIL/uL (ref 3.80–5.20)
RDW: 16.1 % — ABNORMAL HIGH (ref 11.5–14.5)
WBC: 32.4 10*3/uL — ABNORMAL HIGH (ref 3.6–11.0)

## 2015-08-14 LAB — FUNGUS CULTURE W SMEAR

## 2015-08-16 LAB — GLUCOSE, CAPILLARY: Glucose-Capillary: 201 mg/dL — ABNORMAL HIGH (ref 65–99)

## 2015-09-01 NOTE — Progress Notes (Signed)
   2015-08-27 1900  Clinical Encounter Type  Visited With Patient and family together  Visit Type Follow-up;Spiritual support;Death  Referral From Nurse  Consult/Referral To Chaplain  Spiritual Encounters  Spiritual Needs Emotional;Grief support  Provided pastoral presence, support and prayer to family.  Responded to death on unit from nurse.  [place] [phone]

## 2015-09-01 NOTE — Progress Notes (Signed)
Patient appears to be in agonal breathing.  On IV morphine for comfort measures. Poorly responsive.  Family by the side of patient. Examination shows patient lying in the bed poorly responsive.  Not in any acute distress. Tachycardia. Tachypnea with in between apnea I discussed situation with palliative care physician as well as with family End  appears to be near and patient is very comfortable at this time

## 2015-09-01 NOTE — Plan of Care (Signed)
Problem: Coping: Goal: Ability to identify and develop effective coping behavior will improve Outcome: Progressing Pt and pt husband remain calm but worried about current circumstances. Emotional support given.   Problem: Respiratory: Goal: Verbalizations of increased ease of respirations will increase Outcome: Progressing HFNC remains, O2 sats dropping through the night now staying around 80%. RT involved in oxygen therapy management. Longer periods of apnea & retractions starting around 2330 & remains. PRN Ativan given to help w/ labored breathing. Will continue to assess.   Problem: Pain Management: Goal: General experience of comfort will improve Outcome: Progressing No c/o pain or discomfort.

## 2015-09-01 NOTE — Progress Notes (Signed)
[place] [facility] [place] NAME: Brittney Lam    MR#:  [id number]  DATE OF BIRTH:  1961/02/02  SUBJECTIVE:   Continues to be short of breath on oxygen started on morphine drip for comfort care  REVIEW OF SYSTEMS:   Review of Systems  Limited    DRUG ALLERGIES:   Allergies  Allergen Reactions  . Metformin Diarrhea and Nausea Only  . Other Rash    Sage Wipes    VITALS:  Blood pressure 85/46, pulse 138, temperature 99.2 F (37.3 C), temperature source Oral, resp. rate 10, height '5\' 2"'$  (1.575 m), weight 65.318 kg (144 lb), SpO2 74 %.  PHYSICAL EXAMINATION:   Physical Exam  GENERAL:  55 y.o.-year-old patient lying in the bed with mild  acute distress. Terminale Ill-appearing  EYES: Pupils equal, round, reactive to light and accommodation. No scleral icterus. Extraocular muscles intact.  HEENT: Head atraumatic, normocephalic. Oropharynx and nasopharynx clear.  NECK:  Supple, no jugular venous distention. No thyroid enlargement, no tenderness.  LUNGS: Decreased breath sounds bilaterally with some asssory muscle usage CARDIOVASCULAR: S1, S2 normal. No murmurs, rubs, or gallops. Mild tachycardia ABDOMEN: Soft, nontender, nondistended. Bowel sounds present. No organomegaly or mass.  EXTREMITIES: No cyanosis, clubbing or edema b/l.    NEUROLOGIC: Cranial nerves II through XII are intact. No focal Motor or sensory deficits b/l.   PSYCHIATRIC: The patient is alert and oriented x 3.  SKIN: No obvious rash, lesion, or ulcer.    LABORATORY PANEL:   CBC  Recent Labs Lab 08/23/15 0440  WBC 32.4*  HGB 13.7  HCT 42.2  PLT 336    Chemistries   Recent Labs Lab 08/02/15 0640  Aug 23, 2015 0440  NA 137  < > 137  K 3.7  < > 6.0*  CL 98*  < > 98*  CO2 29  < > 30  GLUCOSE 115*  < > 215*  BUN 27*  < > 70*  CREATININE 0.98  < > 2.10*  CALCIUM 10.0  < > 9.9  MG 1.9  --   --   < > = values in this interval not displayed.  Cardiac  Enzymes No results for input(s): TROPONINI in the last 168 hours.  RADIOLOGY:  No results found.   ASSESSMENT AND PLAN:  Brittney Lam is a 55 y.o. 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  * Acute respiratory failure with hypoxia Left upper lobe lung cancer, radiation pneumonitis and bilateral pneumonia BAL is negative 07/30/2015) -completed broad-spectrum antibiotic coverage with IV vanc, zithromax  and zosyn --completed abxs -Status post treatment with IV steroid (high doses for possible radiation pneumonitis) -HFNC  -now DNR, prognosis long term poor Failure to improve, Patient receiving comfort care measures now on morphine drip chance of survival is very poor  * DiabetesSliding scale insulin  * Lung cancer Last chemo and radiation several weeks ago  * Hyperlipidemia  Zocor.    CODE STATUS: DNR  DVT Prophylaxis: lovenox  TOTAL  TIME TAKING CARE OF THIS PATIENT: 30 minutes.  Dustin Flock M.D on 2015/08/23 at 12:57 PM  Between 7am to 6pm - Pager - [phone]  After 6pm go to [email or web address] - password [facility]  Office  [phone]  CC: Primary care physician; [name], MD

## 2015-09-01 NOTE — Progress Notes (Signed)
Palliative Care Update   Pt is lingering still.  She has a resp rate of about 4 -5 / minute. I have spoken with nursing, family, and with Dr. Oliva Bustard today about pt.    She has periods of apnea and is unresponsive. But, she appears comfortable.    There is family in the room and they are all talking about their church.  Husband doesn't recognize me or talk to me, except he does say that he will let us know if they need anything.   She will likely pass away soon.    Colleen Can, MD

## 2015-09-01 NOTE — Progress Notes (Signed)
Patient expired 1850. Pronounced by Isaiah Serge RN and Earlie Server RN. MD notified, nursing supervisor notified. Report called and given to Earnest Conroy at [facility].

## 2015-09-01 NOTE — Progress Notes (Addendum)
Palliative Medicine Inpatient Consult Follow Up Note   Name: Brittney Lam Date: 2015/08/26 MRN: [id number]  DOB: [birth year]  Referring Physician: Dustin Flock, MD  Palliative Care consult requested for this 55 y.o. female for goals of medical therapy in patient with terminal respiratory failure due to lung/breast cancer  And or pneumonia/ pneumonitis.    TODAY'S DISCUSSIONS AND DECISIONS:  1.  Pt continues to be DNR status  2.  Pt has continued to drop oxygen saturations during the night with last reading in the data listed in the high 70's.   3.  Morphine drip continues at 2 mg/ hr and pt does not seem to be in need of any higher dose or any other symptom management.  I have discussed this with nursing to be sure we are meeting pt's needs for comfort and also, per my exam, I think pt is comfortable at current very low dose.    4.  Husband has shut himself off a bit:  Bedside watching a DVD movie on IPad with headphones on --not talking to nurses and barely spoke to me saying, "I'm allright".  I think he is trying to avoidbeing overcome by the strong emotions he knows are inside him now.   5.  I have asked for a Bereavement Cart and also to have cleaning done 'later' in room.  And staff is asked to be aware of the needs for a respectful presence when in the hall near the room.      6.  Pt is not stable to be transferred to [facility] --she will pass away here (no '[facility]IP Hospice' program currently so admission to Hospice while remaining here is NOT an option).     IMPRESSION: 1. Acute on recently chronic hypoxic respiratory failure --due to cancer in lungs and/ or pneumonia --bilateral upper lobe infiltrates may be pneumonitis or progressing cancer  --has completed ABX courses --negative bronch for malignancy and cultures were negative --has been on BIPAP off and on --does not like it much --has also been on Hi Flow (very Hi Flow --100% and many LPM) --sats are dropping  now 2. Lung cancer  --lung bx pos for poorly diff. adneoca T1 N1 M0 tumor III ---was stage 3 s/p CRT now with spread/ vs infxn vs both vs pneumonitis 3. Breast Cancer  ---detailes per oncology notes 4. DM2 5. H/O tobacco smoking 6. CKD stage not known  7. Essential HTN 8. Leukocytosis   REVIEW OF SYSTEMS:  Patient is not able to provide ROS due to critical / terminal illness.   CODE STATUS: DNR   PAST MEDICAL HISTORY: Past Medical History  Diagnosis Date  . Unspecified essential hypertension   . Kidney failure   . Personal history of tobacco use, presenting hazards to health   . Cataract   . Personal history of malignant neoplasm of breast   . Breast screening, unspecified   . Special screening for malignant neoplasms, colon   . Diabetes mellitus without complication ([place])   . MDRO (multiple drug resistant organisms) resistance   . H. pylori duodenitis   . Complication of anesthesia     BP DROPPED DURING LOBECTOMY IN 2014  . Cancer [facility]) 2001    left breast cancer s/p L/SN/R in 2001  . Cancer [facility]) 2014    right breast invasive CA, Er pos,Pr neg, Her 2 neg.  . Lung cancer, upper lobe ([place]) 2014    right upper lobe  . Breast cancer ([place])  PAST SURGICAL HISTORY:  Past Surgical History  Procedure Laterality Date  . Intercostal nerve block  2005  . Cataract extraction w/ intraocular lens implant  2005  . Carpal tunnel release Right 2011  . Insertion central venous access device w/ subcutaneous port  12-07-12  . Breast lumpectomy Left 2001  . Breast surgery Right 2014    total mastectomy  . Right lung upper lobectomy  03/2013  . Lobectomy Right   . Cataract extraction    . Mastectomy Right   . Colonoscopy    . Endobronchial ultrasound N/A 02/05/2015    Procedure: ENDOBRONCHIAL ULTRASOUND;  Surgeon: Flora Lipps, MD;  Location: [facility] ORS;  Service: Cardiopulmonary;  Laterality: N/A;  . Flexible bronchoscopy N/A 07/06/2015    Procedure: FLEXIBLE BRONCHOSCOPY;   Surgeon: Allyne Gee, MD;  Location: [facility] ORS;  Service: Pulmonary;  Laterality: N/A;    Vital Signs: BP 85/46 mmHg  Pulse 138  Temp(Src) 99.2 F (37.3 C) (Oral)  Resp 10  Ht '5\' 2"'$  (1.575 m)  Wt 65.318 kg (144 lb)  BMI 26.33 kg/m2  SpO2 76% Filed Weights   07/18/2015 1142  Weight: 65.318 kg (144 lb)    Estimated body mass index is 26.33 kg/(m^2) as calculated from the following:   Height as of this encounter: '5\' 2"'$  (1.575 m).   Weight as of this encounter: 65.318 kg (144 lb).  PHYSICAL EXAM: Obtunded Long apneic spells  Resp rate about 8 Eyes closed No JVD or TM Hrt rrr no m Lungs ronchi throughout with apnea Abd soft and NT Skin --toes are slightly cyanotic but no mottling of skin is seen distal extremities.   LABS: CBC:    Component Value Date/Time   WBC 32.4* August 11, 2015 0440   WBC 10.7 05/30/2014 1408   WBC 12.2 07/20/2013   HGB 13.7 2015/08/11 0440   HGB 13.6 05/30/2014 1408   HCT 42.2 2015/08/11 0440   HCT 40.4 05/30/2014 1408   HCT 37.5 07/20/2013   PLT 336 11-Aug-2015 0440   PLT 299 05/30/2014 1408   MCV 93.0 2015-08-11 0440   MCV 95 05/30/2014 1408   NEUTROABS 24.3* 07/31/2015 0508   NEUTROABS 7.1* 05/30/2014 1408   LYMPHSABS 0.8* 07/31/2015 0508   LYMPHSABS 2.2 05/30/2014 1408   MONOABS 0.7 07/31/2015 0508   MONOABS 1.0* 05/30/2014 1408   EOSABS 0.2 07/31/2015 0508   EOSABS 0.3 05/30/2014 1408   BASOSABS 0.1 07/31/2015 0508   BASOSABS 0.2* 05/30/2014 1408   Comprehensive Metabolic Panel:    Component Value Date/Time   NA 137 August 11, 2015 0440   NA 135* 06/11/2014 1417   NA 135* 07/20/2013   K 6.0* August 11, 2015 0440   K 3.4* 06/11/2014 1417   CL 98* 08/11/15 0440   CL 101 06/11/2014 1417   CO2 30 08/11/2015 0440   CO2 27 06/11/2014 1417   BUN 70* 11-Aug-2015 0440   BUN 10 06/11/2014 1417   CREATININE 2.10* 2015/08/11 0440   CREATININE 0.99 06/11/2014 1417   CREATININE 0.9 07/20/2013   GLUCOSE 215* August 11, 2015 0440   GLUCOSE 263*  06/11/2014 1417   CALCIUM 9.9 2015/08/11 0440   CALCIUM 9.4 06/11/2014 1417   AST 15 07/31/2015 0508   AST 18 06/11/2014 1417   ALT 17 07/31/2015 0508   ALT 38 06/11/2014 1417   ALKPHOS 114 07/31/2015 0508   ALKPHOS 141* 06/11/2014 1417   BILITOT 0.2* 07/31/2015 0508   BILITOT 0.3 06/11/2014 1417   PROT 7.0 07/31/2015 0508   PROT 7.3 06/11/2014  1417   ALBUMIN 2.9* 07/31/2015 0508   ALBUMIN 3.8 06/11/2014 1417    More than 50% of the visit was spent in counseling/coordination of care: YES  Time Spent:  35 min

## 2015-09-01 DEATH — deceased

## 2015-09-03 ENCOUNTER — Telehealth: Payer: Self-pay | Admitting: *Deleted

## 2015-09-03 NOTE — Telephone Encounter (Signed)
Opened in error

## 2015-09-06 LAB — ACID FAST SMEAR+CULTURE W/RFLX (ARMC ONLY)
Acid Fast Culture: NEGATIVE
Acid Fast Smear: NEGATIVE

## 2015-09-17 ENCOUNTER — Inpatient Hospital Stay: Payer: No Typology Code available for payment source

## 2015-10-02 NOTE — Discharge Summary (Signed)
Physician Discharge Summary  Patient ID: Brittney Lam MRN: [id number] DOB/AGE: 11/02/1960 55 y.o.  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  Multiple CT scan of the chest and head revealed progressive bilateral pulmonary infiltrate.  Patient was kept in intensive care unit with broad-spectrum antibiotic, IV steroid, inhalation therapy Gradually patient's condition got worse becoming more and more hypoxic. Patient did not want intubation. Had bronchoscopy done which did not reveal any evidence of malignancy Patient had been seen by multiple consultant As patient condition did not improve and was transferred to regular floor.  Palliative care consult discuss options with the family Patient expired on December 7  Consults: 1.  Intensivist and pulmonologist 2.  Infectious disease specialist 3.  Primary doc 4.  Palliative care  Significant Diagnostic Studies: {.  Bronchoscopy Multiple CT scan of the chest and head.  Hospital course Patient was In intensive care unit for several days with very aggressive bronchodilator therapy and antibiotic and IV steroid without much improvement. Oxygen saturation remained poor.  Patient remained hypoxic. Discharge Exam: In the morning of 12  /,7 Patient was poorly responsive. had AGONAL of breathing. Tachycardic.  Disposition: 20-Expired     Medication List    ASK your doctor about these medications        acetaminophen 325 MG tablet  Commonly known as:  TYLENOL  Take 650 mg by mouth every 6 (six) hours as needed for pain.     BRISDELLE PO  Take 1 tablet by mouth at bedtime.     calcium citrate-vitamin D 315-200 MG-UNIT tablet  Commonly known as:  CITRACAL+D  Take 1 tablet by mouth 2 (two) times daily.     esomeprazole 20 MG capsule  Commonly known as:  NEXIUM  Take 1 capsule (20 mg total) by mouth every morning.     gabapentin 300 MG capsule  Commonly known as:  NEURONTIN  Take 300 mg by mouth as needed.     glipiZIDE 5 MG 24 hr tablet  Commonly known as:  GLUCOTROL XL  Take by mouth.     hydrochlorothiazide 25 MG tablet  Commonly known as:  HYDRODIURIL  Take 12.5 mg by mouth daily.     letrozole 2.5 MG tablet  Commonly known as:  FEMARA  Take 2.5 mg by mouth daily.     MULTIVITAMIN PO  Take by mouth.     naproxen sodium 220 MG tablet  Commonly known as:  ANAPROX  Take 220 mg by mouth every 8 (eight) hours as needed.     nicotine polacrilex 2 MG gum  Commonly known as:  NICORETTE  Take 2 mg by mouth 5 (five) times daily.     ondansetron 4 MG tablet  Commonly known as:  ZOFRAN  Take 1 tablet (4 mg total) by mouth every 6 (six) hours as needed for nausea or vomiting.  potassium chloride SA 20 MEQ tablet  Commonly known as:  K-DUR,KLOR-CON  Take 1 tablet (20 mEq total) by mouth daily.     simvastatin 10 MG tablet  Commonly known as:  ZOCOR  Take 10 mg by mouth daily.     venlafaxine 37.5 MG tablet  Commonly known as:  EFFEXOR  Take 1 tablet (37.5 mg total) by mouth daily.         Signed: Forest Gleason 09/04/2015, 1:20 PM

## 2015-11-04 ENCOUNTER — Inpatient Hospital Stay: Admission: RE | Admit: 2015-11-04 | Payer: 59 | Source: Ambulatory Visit | Admitting: Radiation Oncology

## 2015-12-27 ENCOUNTER — Ambulatory Visit: Payer: 59 | Admitting: Radiation Oncology

## 2016-02-05 IMAGING — MR MR HEAD WO/W CM
10 of 13 series · 37 of 48 positions shown · IV contrast (multihance)
Comparison: None.

CLINICAL DATA: Recently diagnosed stage III lung cancer. Personal
history of breast cancer.

EXAM:
MRI HEAD WITHOUT AND WITH CONTRAST
TECHNIQUE: Multiplanar, multiecho pulse sequences of the brain and surrounding
structures were obtained without and with intravenous contrast.
CONTRAST:  13mL MULTIHANCE GADOBENATE DIMEGLUMINE 529 MG/ML IV SOLN

[Series 4: DWI · axial · 4.0mm · 0.94mm/px · z∈[-74,+82]mm · 4 of 40 slices shown (1 of 4)]
[im 1/40]
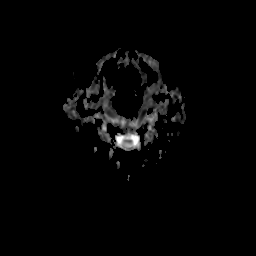
[im 14/40]
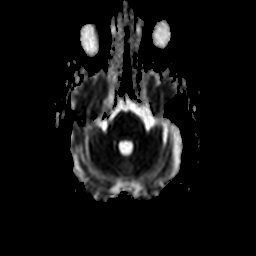
[im 27/40]
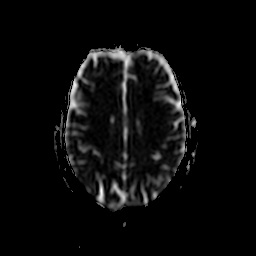
[im 40/40]
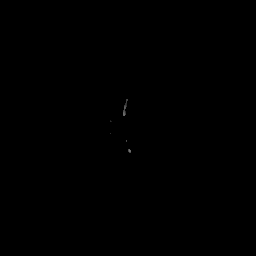

[Series 5: DWI · axial · 4.0mm · 0.94mm/px · z∈[-74,+82]mm · 4 of 40 slices shown (2 of 4)]
[im 1/40]
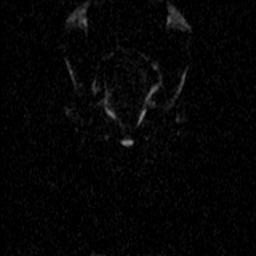
[im 14/40]
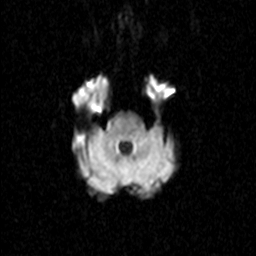
[im 27/40]
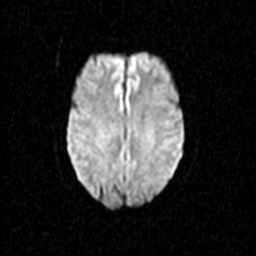
[im 40/40]
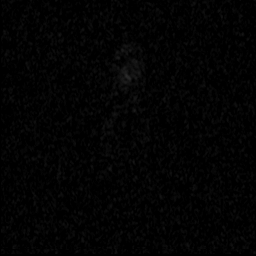

[Series 7: DWI · coronal · 5.0mm · 1.80mm/px · 4 of 35 slices shown (3 of 4)]
[im 1/35]
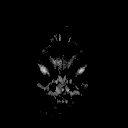
[im 12/35]
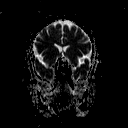
[im 23/35]
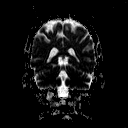
[im 35/35]
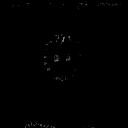

[Series 8: DWI · coronal · 5.0mm · 1.80mm/px · 4 of 35 slices shown (4 of 4)]
[im 1/35]
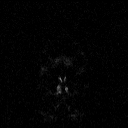
[im 12/35]
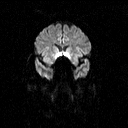
[im 23/35]
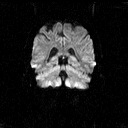
[im 35/35]
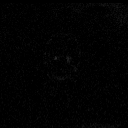

[Series 9: T2 · axial · 5.0mm · 0.45mm/px · z∈[-67,+76]mm · 3 of 23 slices shown (1 of 2)]
[im 1/23]
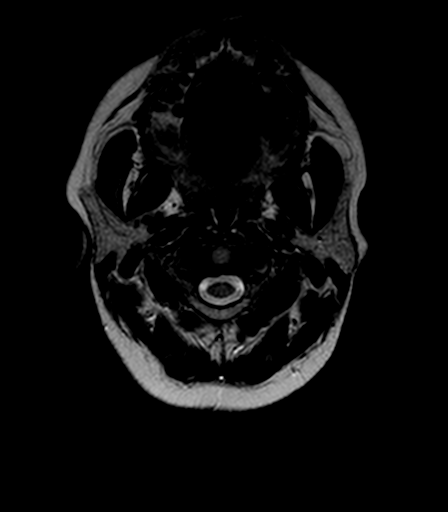
[im 12/23]
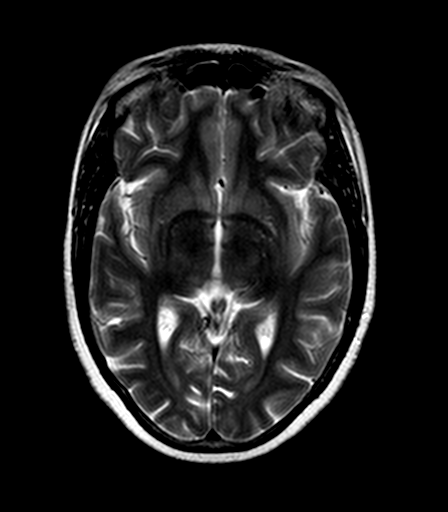
[im 23/23]
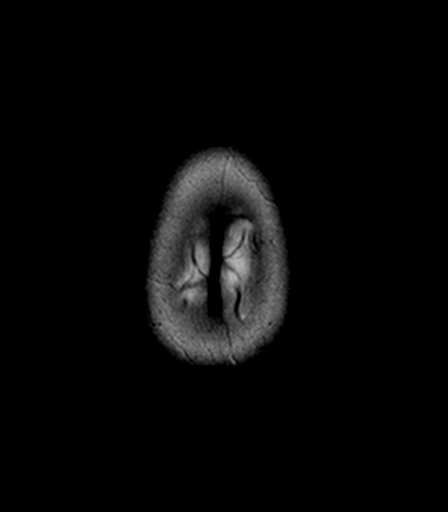

[Series 10: FLAIR · axial · 5.0mm · 0.90mm/px · z∈[-67,+76]mm · 3 of 23 slices shown]
[im 1/23]
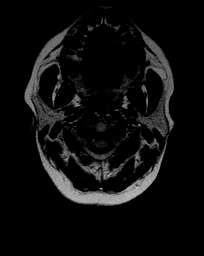
[im 12/23]
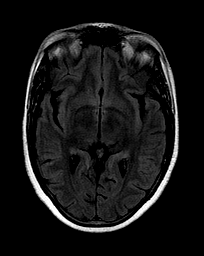
[im 23/23]
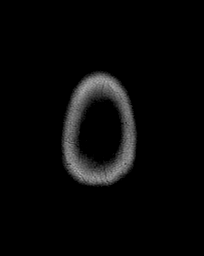

[Series 11: T2 · axial · 5.0mm · 0.45mm/px · z∈[-66,+77]mm · 3 of 23 slices shown (2 of 2)]
[im 1/23]
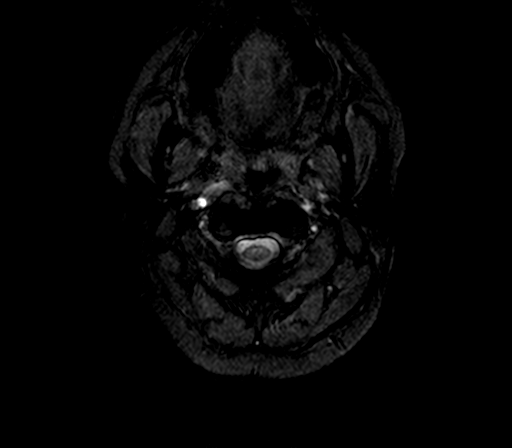
[im 12/23]
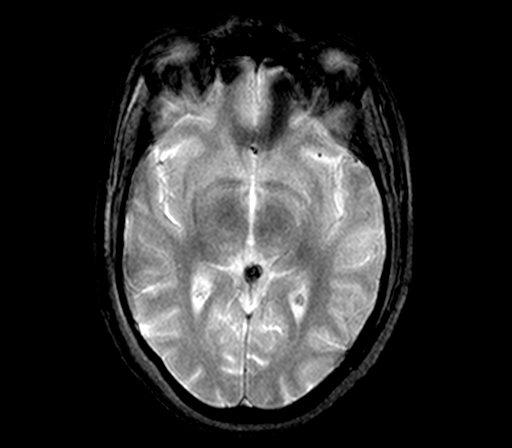
[im 23/23]
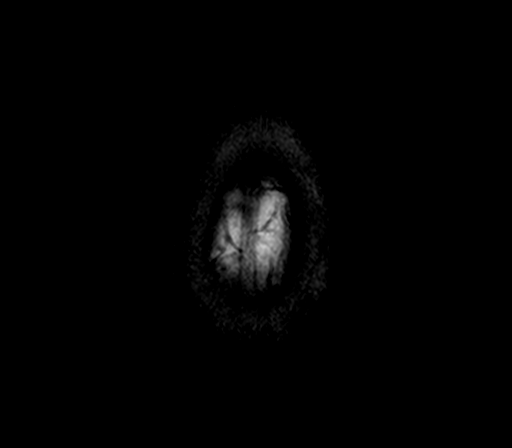

[Series 14: T1 post-contrast · axial · 3.0mm · 0.45mm/px · z∈[-78,+87]mm · 6 of 56 slices shown (1 of 3)]
[im 1/56]
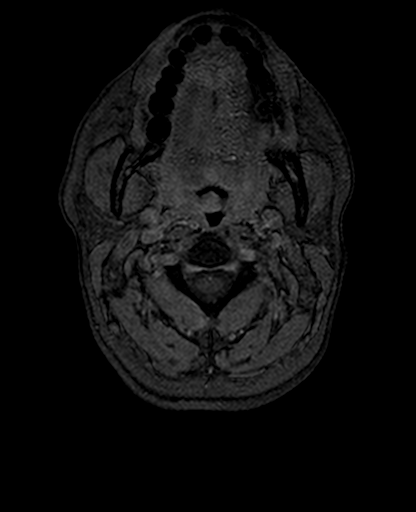
[im 12/56]
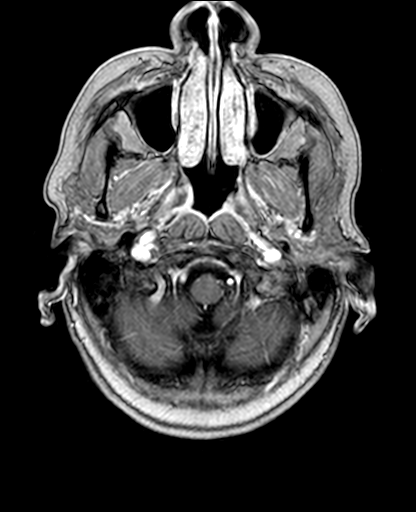
[im 23/56]
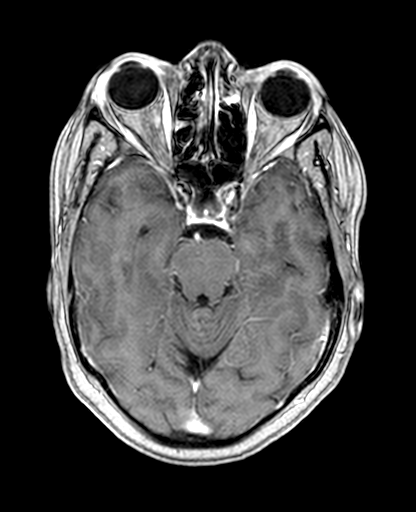
[im 34/56]
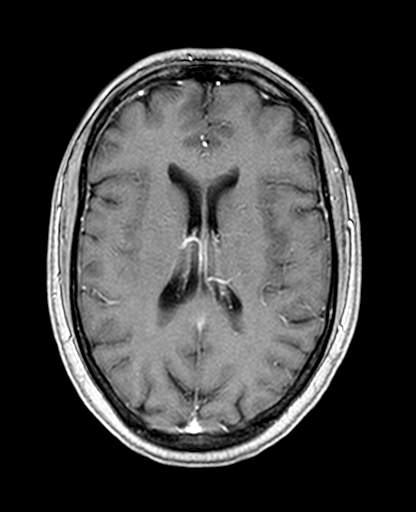
[im 45/56]
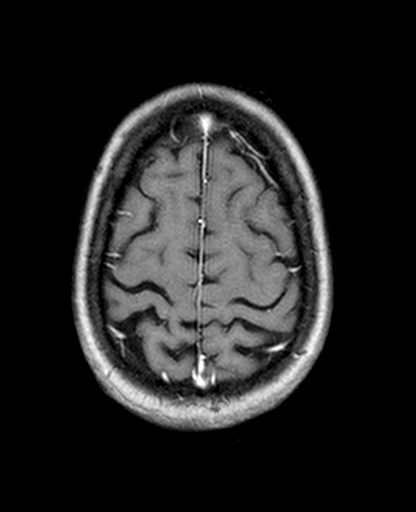
[im 56/56]
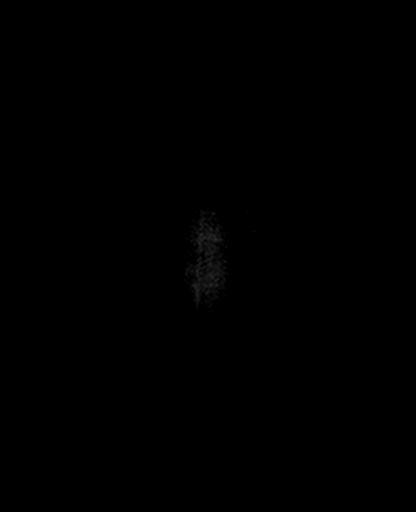

[Series 15: T1 post-contrast · coronal · 5.0mm · 0.45mm/px · 3 of 27 slices shown (2 of 3)]
[im 1/27]
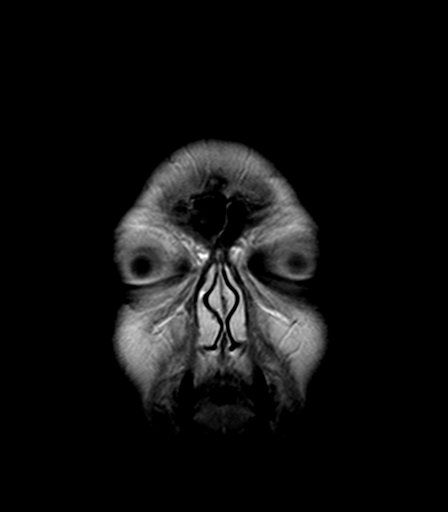
[im 14/27]
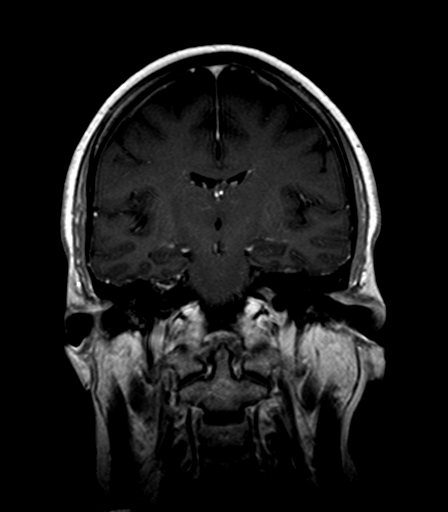
[im 27/27]
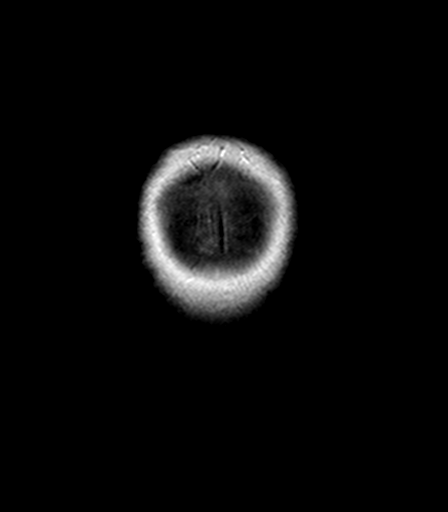

[Series 16: T1 post-contrast · sagittal · 5.0mm · 0.45mm/px · 3 of 25 slices shown (3 of 3)]
[im 1/25]
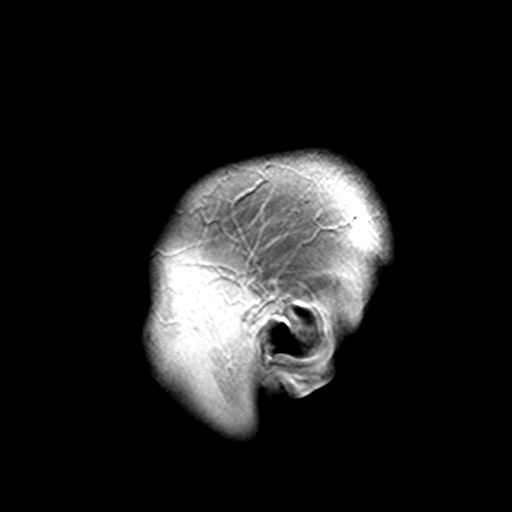
[im 13/25]
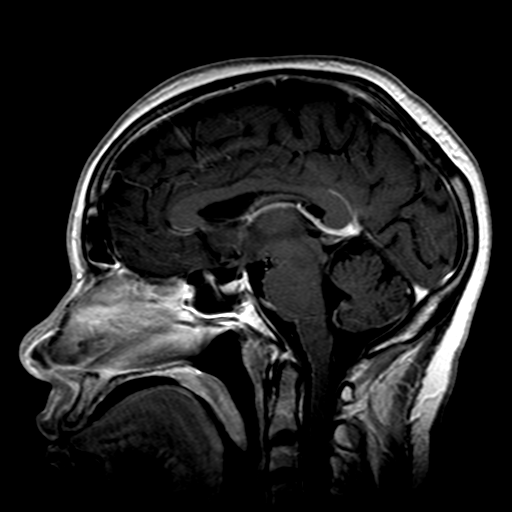
[im 25/25]
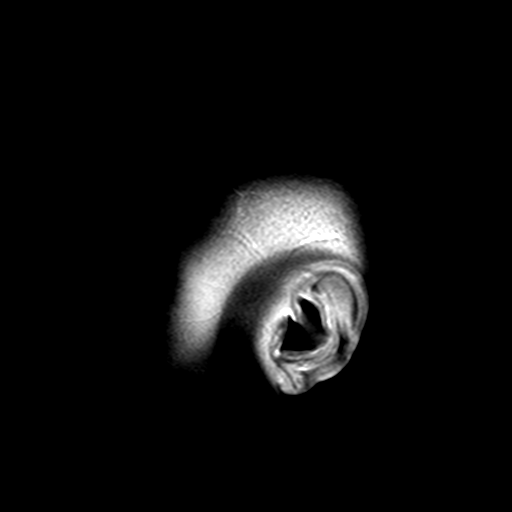

[37 of 48 positions shown; findings below may reference images not displayed]

FINDINGS: There is no evidence of acute infarct, intracranial hemorrhage,
mass, midline shift, or extra-axial fluid collection. Ventricles and
sulci are normal. There are a few punctate foci of T2 hyperintensity
in the subcortical cerebral white matter, nonspecific and not
greater than expected for patient's age. No abnormal enhancement is
identified.

Prior bilateral cataract extraction is noted. Mild bilateral ethmoid
and left maxillary sinus mucosal thickening is noted. No significant
mastoid effusion. Major intracranial vascular flow voids are
preserved.
IMPRESSION: Unremarkable appearance of the brain for age. No evidence of
intracranial metastatic disease.

## 2016-02-17 ENCOUNTER — Other Ambulatory Visit: Payer: Self-pay | Admitting: Nurse Practitioner

## 2016-07-11 IMAGING — CR DG CHEST 1V PORT
1 series · 1 of 1 positions shown · non-contrast
Comparison: Chest x-rays dated 07/31/2015 and 07/26/2015.

CLINICAL DATA: Known respiratory failure.

EXAM:
PORTABLE CHEST 1 VIEW

[ap]
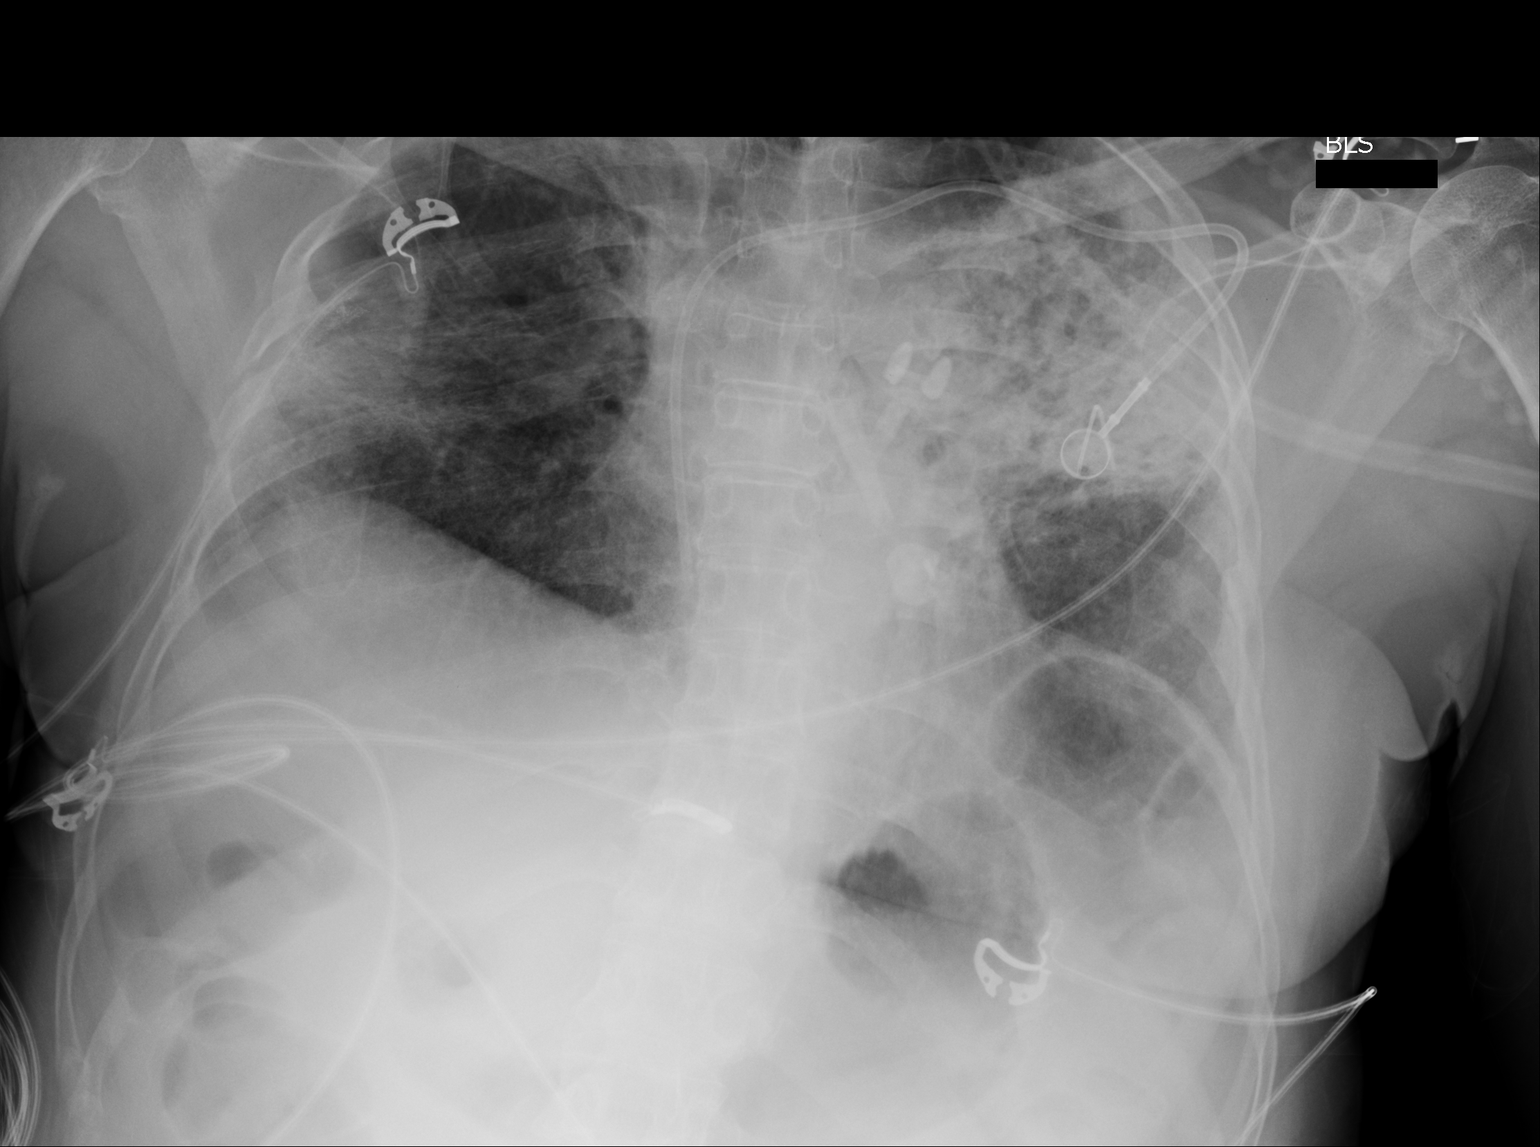

[1 of 1 positions shown; findings below may reference images not displayed]

FINDINGS: Cardiomediastinal silhouette is stable in size and configuration.
Large dense airspace opacity within the left upper lung is
unchanged. Milder vague airspace opacity within the lateral aspects
of the right lung is unchanged. No new lung findings. No large
pleural effusion.

Left chest wall Port-A-Cath is stable in position with tip in the
lower SVC. Osseous and soft tissue structures about the chest are
otherwise unremarkable.
IMPRESSION: Stable chest x-ray. Stable dense airspace opacity within the left
lung. Smaller vague airspace opacity within the lateral aspects of
the right lung is also stable. These consolidations described as
pneumonia versus tumor infiltration on CT of 07/18/2015, therefore,
continued follow-up recommended to ensure resolution.
